# Patient Record
Sex: Male | Born: 1975
Health system: Southern US, Community
[De-identification: ages and names within clinical notes are randomized; demographics above are authoritative.]

## PROBLEM LIST (undated history)

## (undated) DIAGNOSIS — K5720 Diverticulitis of large intestine with perforation and abscess without bleeding: Secondary | ICD-10-CM

## (undated) DIAGNOSIS — F419 Anxiety disorder, unspecified: Secondary | ICD-10-CM

## (undated) DIAGNOSIS — K219 Gastro-esophageal reflux disease without esophagitis: Secondary | ICD-10-CM

## (undated) DIAGNOSIS — I10 Essential (primary) hypertension: Secondary | ICD-10-CM

## (undated) DIAGNOSIS — M1990 Unspecified osteoarthritis, unspecified site: Secondary | ICD-10-CM

## (undated) DIAGNOSIS — G5600 Carpal tunnel syndrome, unspecified upper limb: Secondary | ICD-10-CM

## (undated) HISTORY — DX: Anxiety disorder, unspecified: F41.9

## (undated) HISTORY — DX: Diverticulitis of large intestine with perforation and abscess without bleeding: K57.20

## (undated) HISTORY — DX: Gastro-esophageal reflux disease without esophagitis: K21.9

## (undated) HISTORY — DX: Essential (primary) hypertension: I10

## (undated) HISTORY — PX: VASECTOMY: SHX75

## (undated) HISTORY — DX: Carpal tunnel syndrome, unspecified upper limb: G56.00

---

## 1998-04-16 ENCOUNTER — Emergency Department (HOSPITAL_COMMUNITY): Admission: EM | Admit: 1998-04-16 | Discharge: 1998-04-17 | Payer: Self-pay | Admitting: Emergency Medicine

## 1998-04-20 ENCOUNTER — Ambulatory Visit (HOSPITAL_COMMUNITY): Admission: RE | Admit: 1998-04-20 | Discharge: 1998-04-20 | Payer: Self-pay

## 1999-03-16 ENCOUNTER — Emergency Department (HOSPITAL_COMMUNITY): Admission: EM | Admit: 1999-03-16 | Discharge: 1999-03-16 | Payer: Self-pay | Admitting: Emergency Medicine

## 1999-03-16 ENCOUNTER — Encounter: Payer: Self-pay | Admitting: Emergency Medicine

## 2005-01-29 ENCOUNTER — Emergency Department: Payer: Self-pay | Admitting: Emergency Medicine

## 2005-08-27 ENCOUNTER — Emergency Department: Payer: Self-pay | Admitting: Emergency Medicine

## 2007-06-30 ENCOUNTER — Emergency Department: Payer: Self-pay | Admitting: Emergency Medicine

## 2007-08-03 ENCOUNTER — Emergency Department: Payer: Self-pay | Admitting: Emergency Medicine

## 2009-04-27 ENCOUNTER — Emergency Department (HOSPITAL_COMMUNITY): Admission: EM | Admit: 2009-04-27 | Discharge: 2009-04-28 | Payer: Self-pay | Admitting: Emergency Medicine

## 2009-04-28 IMAGING — CT CT ABDOMEN W/ CM
2 of 4 series · 17 of 46 positions shown, 19 images · IV contrast (agent unspecified)
Comparison: None

CT ABDOMEN

CLINICAL DATA: Nausea, vomiting, right abdominal swelling and pain

CT ABDOMEN AND PELVIS WITH CONTRAST
TECHNIQUE: Multidetector CT imaging of the abdomen and pelvis was
performed using the standard protocol following bolus
administration of intravenous contrast. Breast shield utilized.
Sagittal and coronal MPR images reconstructed from axial data set.
Contrast: 100 ml [FQ] IV, dilute oral contrast

[Series 2: rtn ap with st · axial · 0.74mm/px · z∈[-298,+126]mm · 14 of 93 slices shown, 16 images]
[im 4/93  soft-tissue]
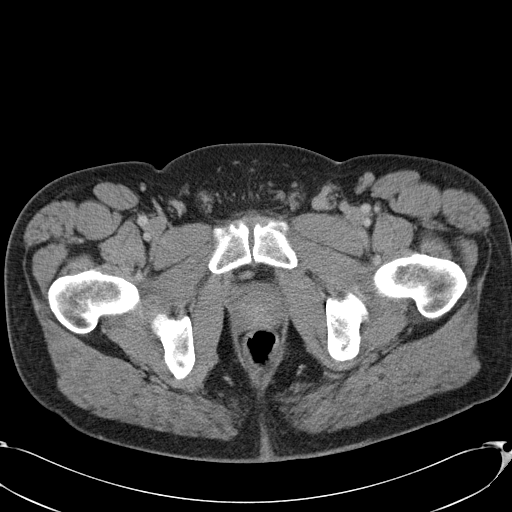
[im 4/93  bone]
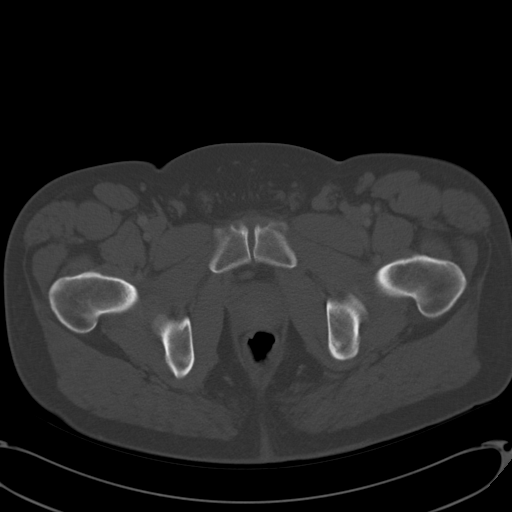
[im 12/93  soft-tissue]
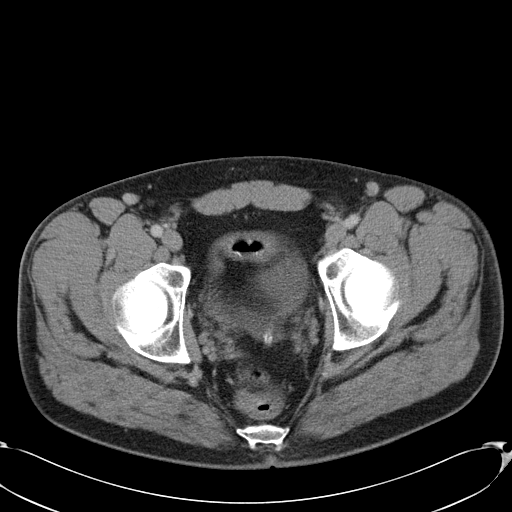
[im 19/93  soft-tissue]
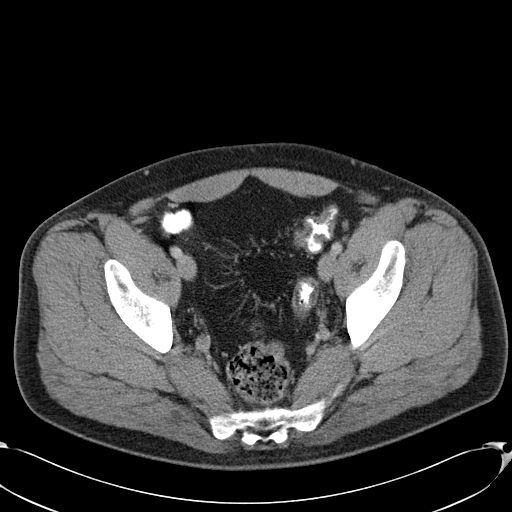
[im 26/93  soft-tissue]
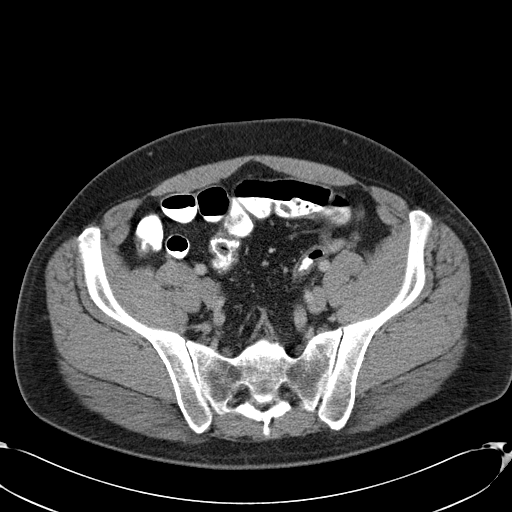
[im 30/93  soft-tissue]
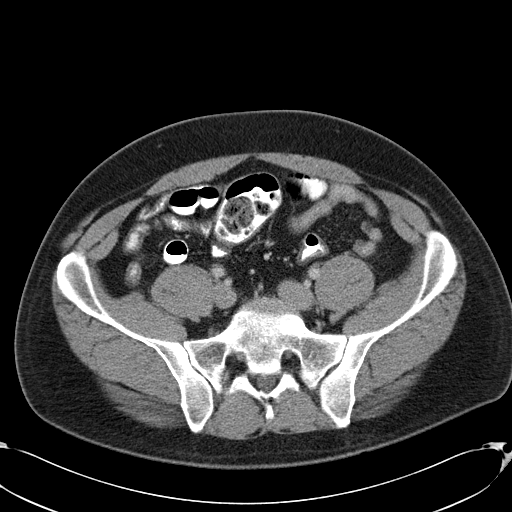
[im 37/93  soft-tissue]
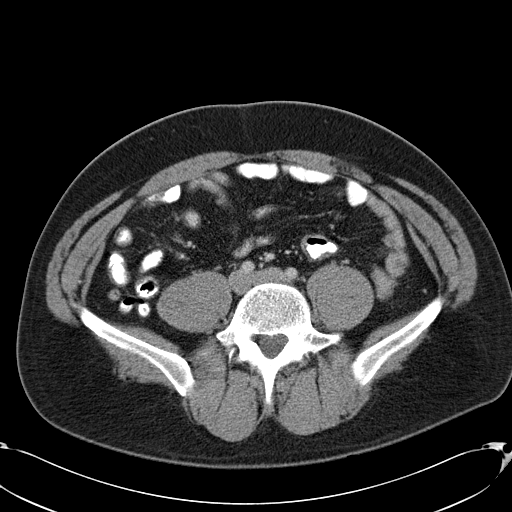
[im 45/93  soft-tissue]
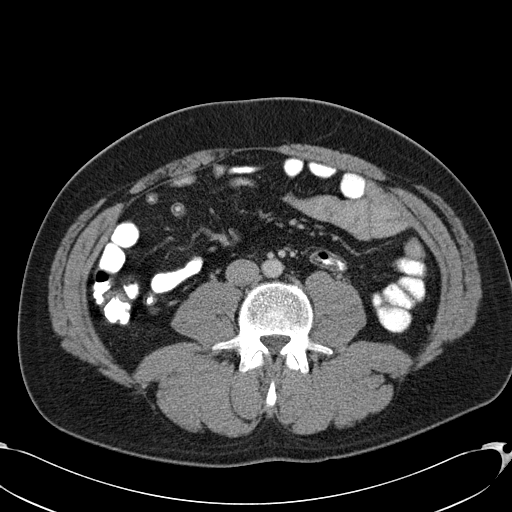
[im 48/93  soft-tissue]
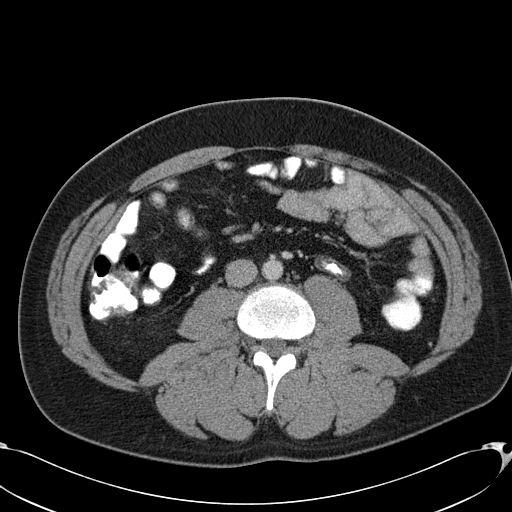
[im 56/93  soft-tissue]
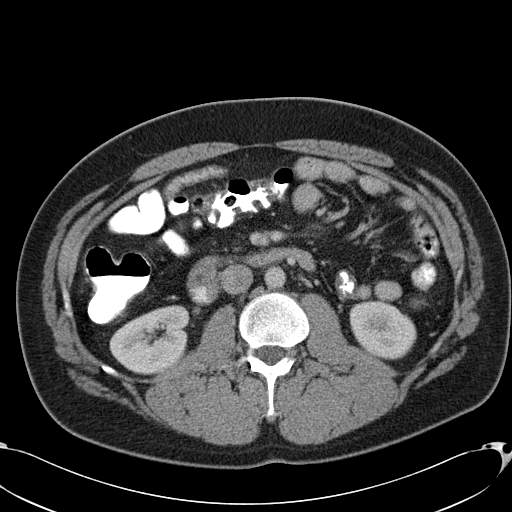
[im 56/93  bone]
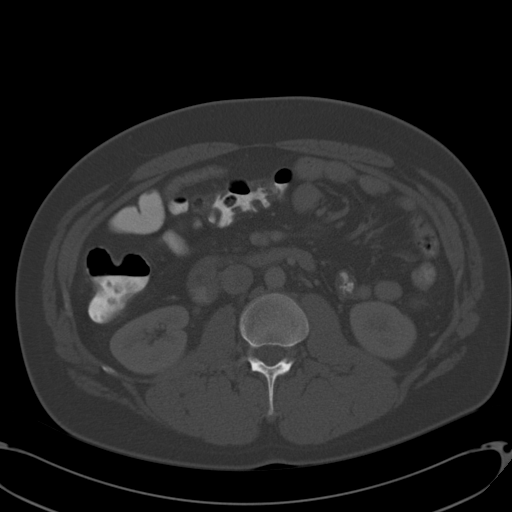
[im 63/93  soft-tissue]
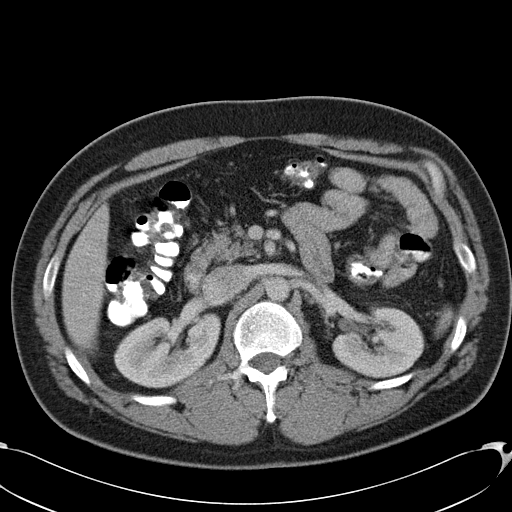
[im 70/93  soft-tissue]
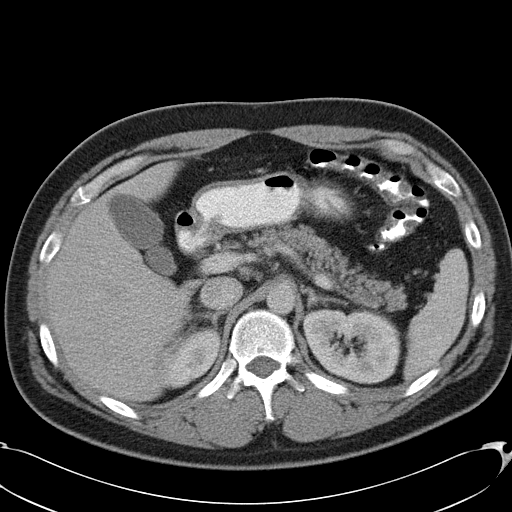
[im 74/93  soft-tissue]
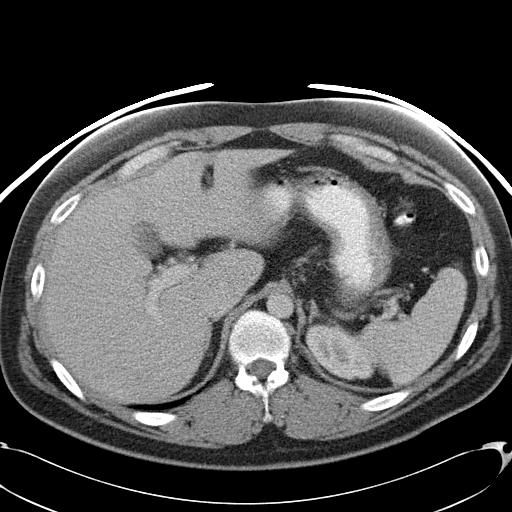
[im 81/93  soft-tissue]
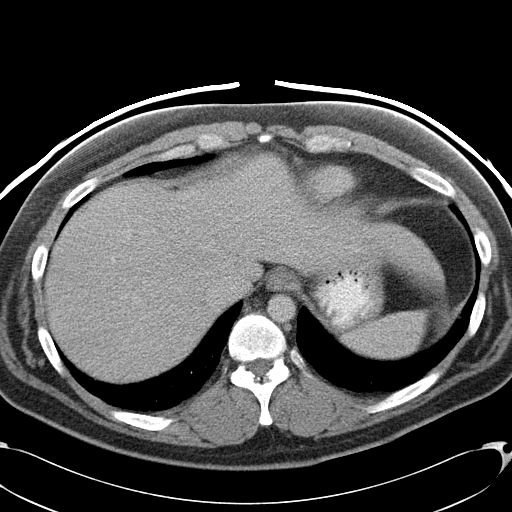
[im 89/93  soft-tissue]
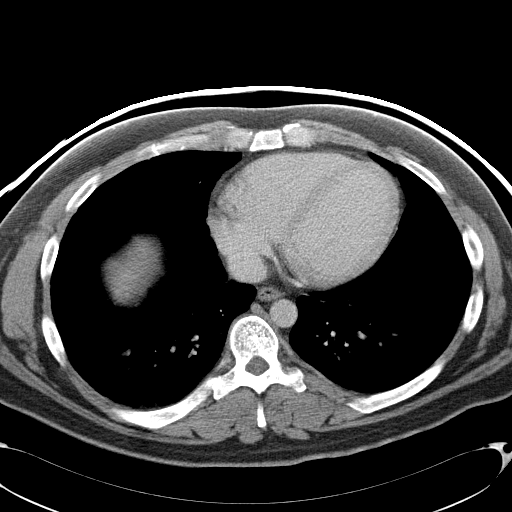

[Series 602: <mpr thick range> · coronal · 0.94mm/px · 3 of 81 slices shown]
[im 27/81  soft-tissue]
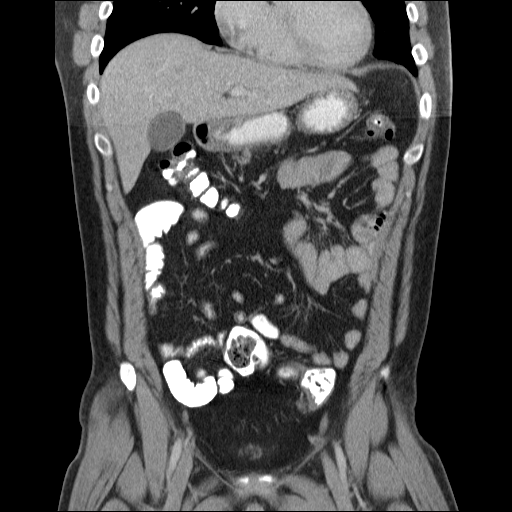
[im 36/81  soft-tissue]
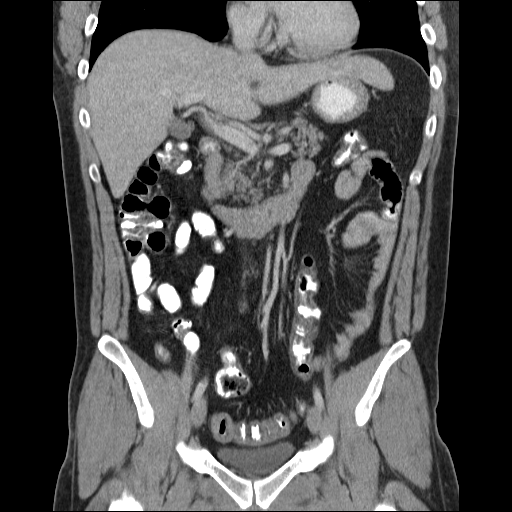
[im 45/81  soft-tissue]
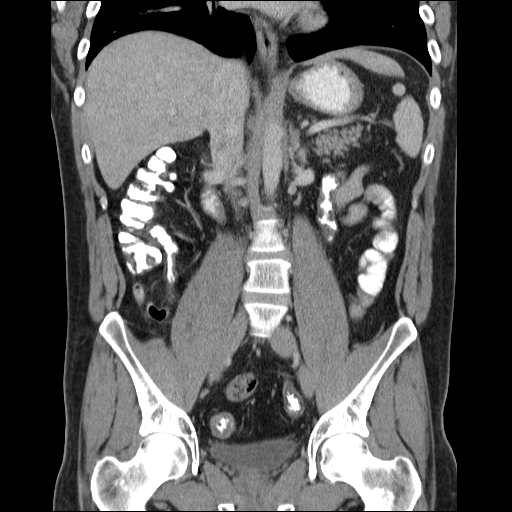

[17 of 46 positions shown; findings below may reference images not displayed]

FINDINGS: Minimal dependent atelectasis at lung bases.
Liver, spleen, pancreas, kidneys, and adrenal glands normal
appearance.
Probable small splenules adjacent to spleen.
Stomach upper abdominal bowel loops unremarkable.
No mass, adenopathy, or free fluid.
Normal-sized lymph node medial to the cecum image 48.
IMPRESSION: No acute upper abdominal abnormalities.

CT PELVIS
FINDINGS: Normal appendix.
Small bowel loops in pelvis normal appearance.
Segmental wall thickening of sigmoid colon, though incompletely
distended, without definite surrounding inflammatory changes.
This could represent muscular hypertrophy, mild colitis, or sequela
of prior diverticulitis.
No discrete acute inflammatory process, abscess, or infiltrative
changes seen.
Bladder and distal ureters normal.
Normal upper normal-sized inguinal lymph nodes.
No hernia.
Bones unremarkable.
IMPRESSION: Wall thickening of sigmoid colon without surrounding inflammatory
changes, question muscular hypertrophy or sequela of
diverticulitis, sigmoid colitis or inflammatory bowel disease, less
likely underdistension artifact, recommend correlation with patient
history and potentially follow-up sigmoidoscopy.
No other intrapelvic abnormalities.

## 2010-05-18 ENCOUNTER — Emergency Department (HOSPITAL_COMMUNITY)
Admission: EM | Admit: 2010-05-18 | Discharge: 2010-05-18 | Payer: Self-pay | Source: Home / Self Care | Admitting: Emergency Medicine

## 2010-05-18 IMAGING — CR DG ANKLE COMPLETE 3+V*L*
3 series · 3 of 3 positions shown · non-contrast
Comparison: None.

CLINICAL DATA: Left ankle injury.  Swelling.

LEFT ANKLE COMPLETE - 3+ VIEW

[t ankle joint ap left]
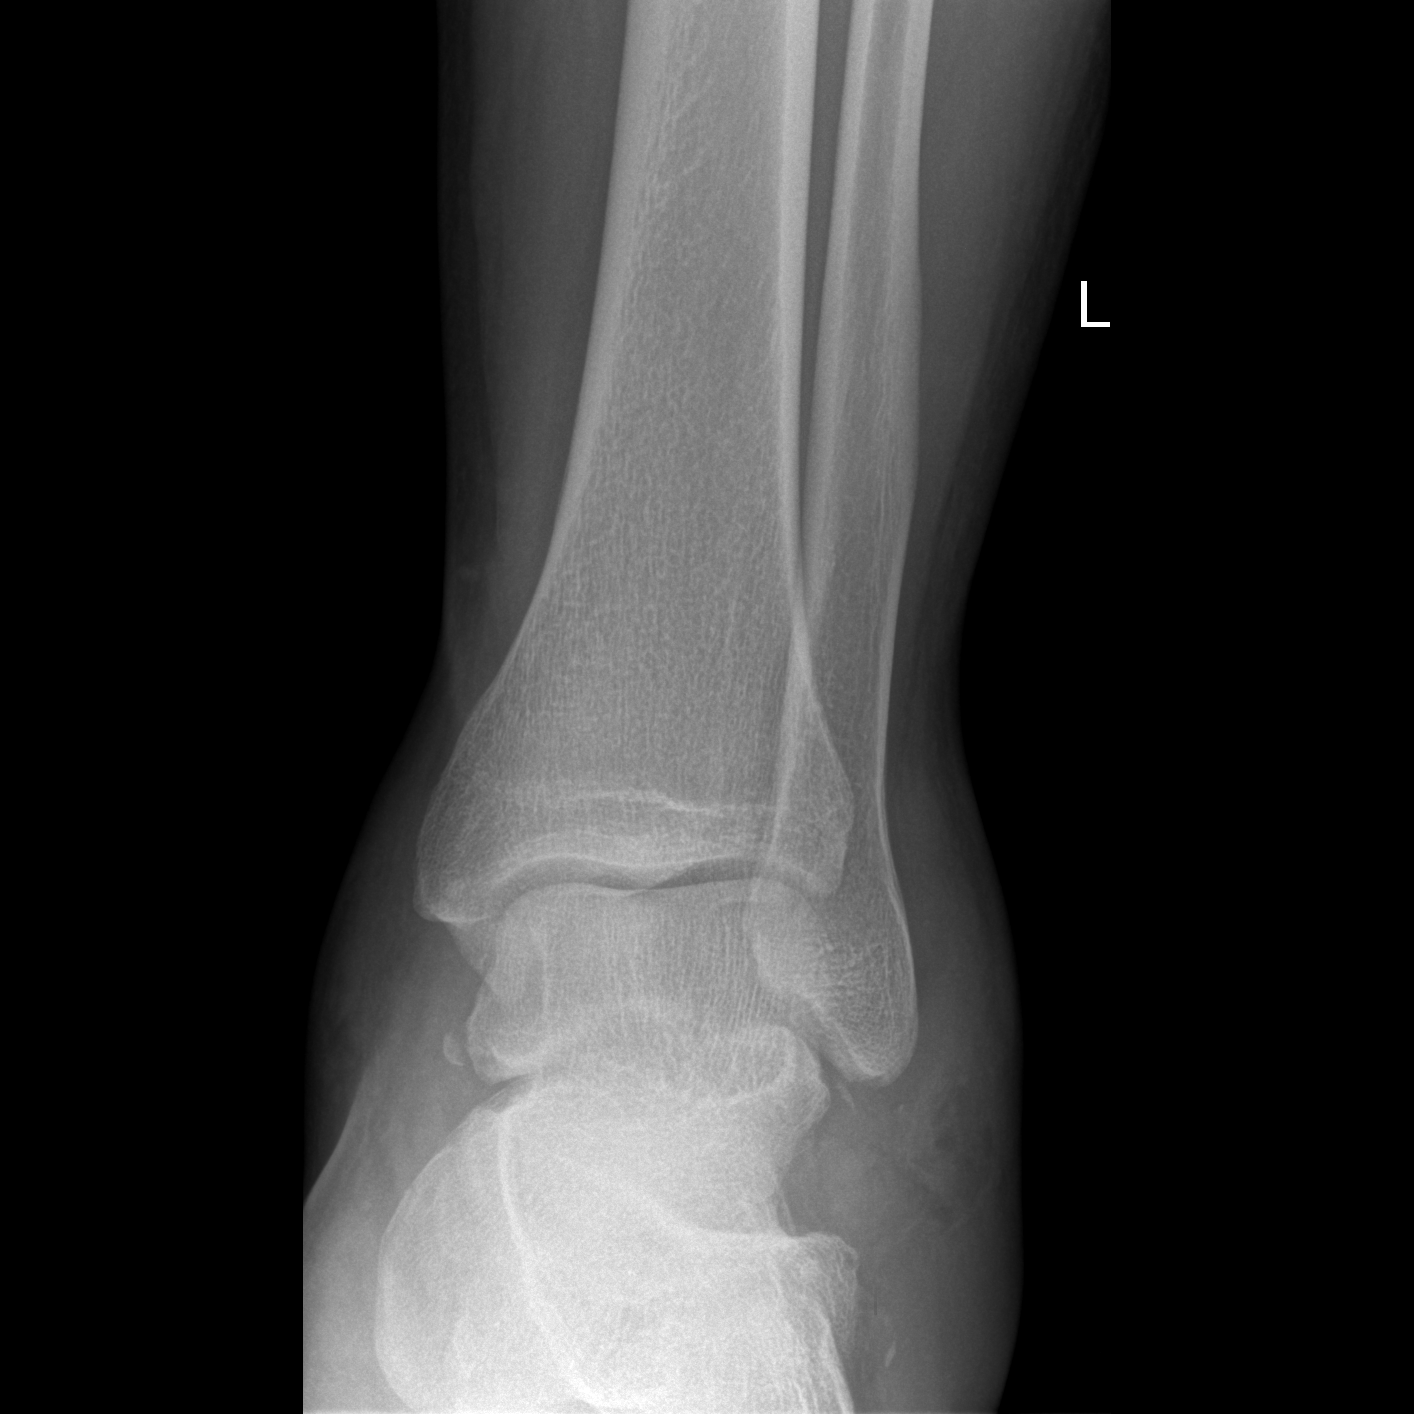

[t ankle joint oblique left]
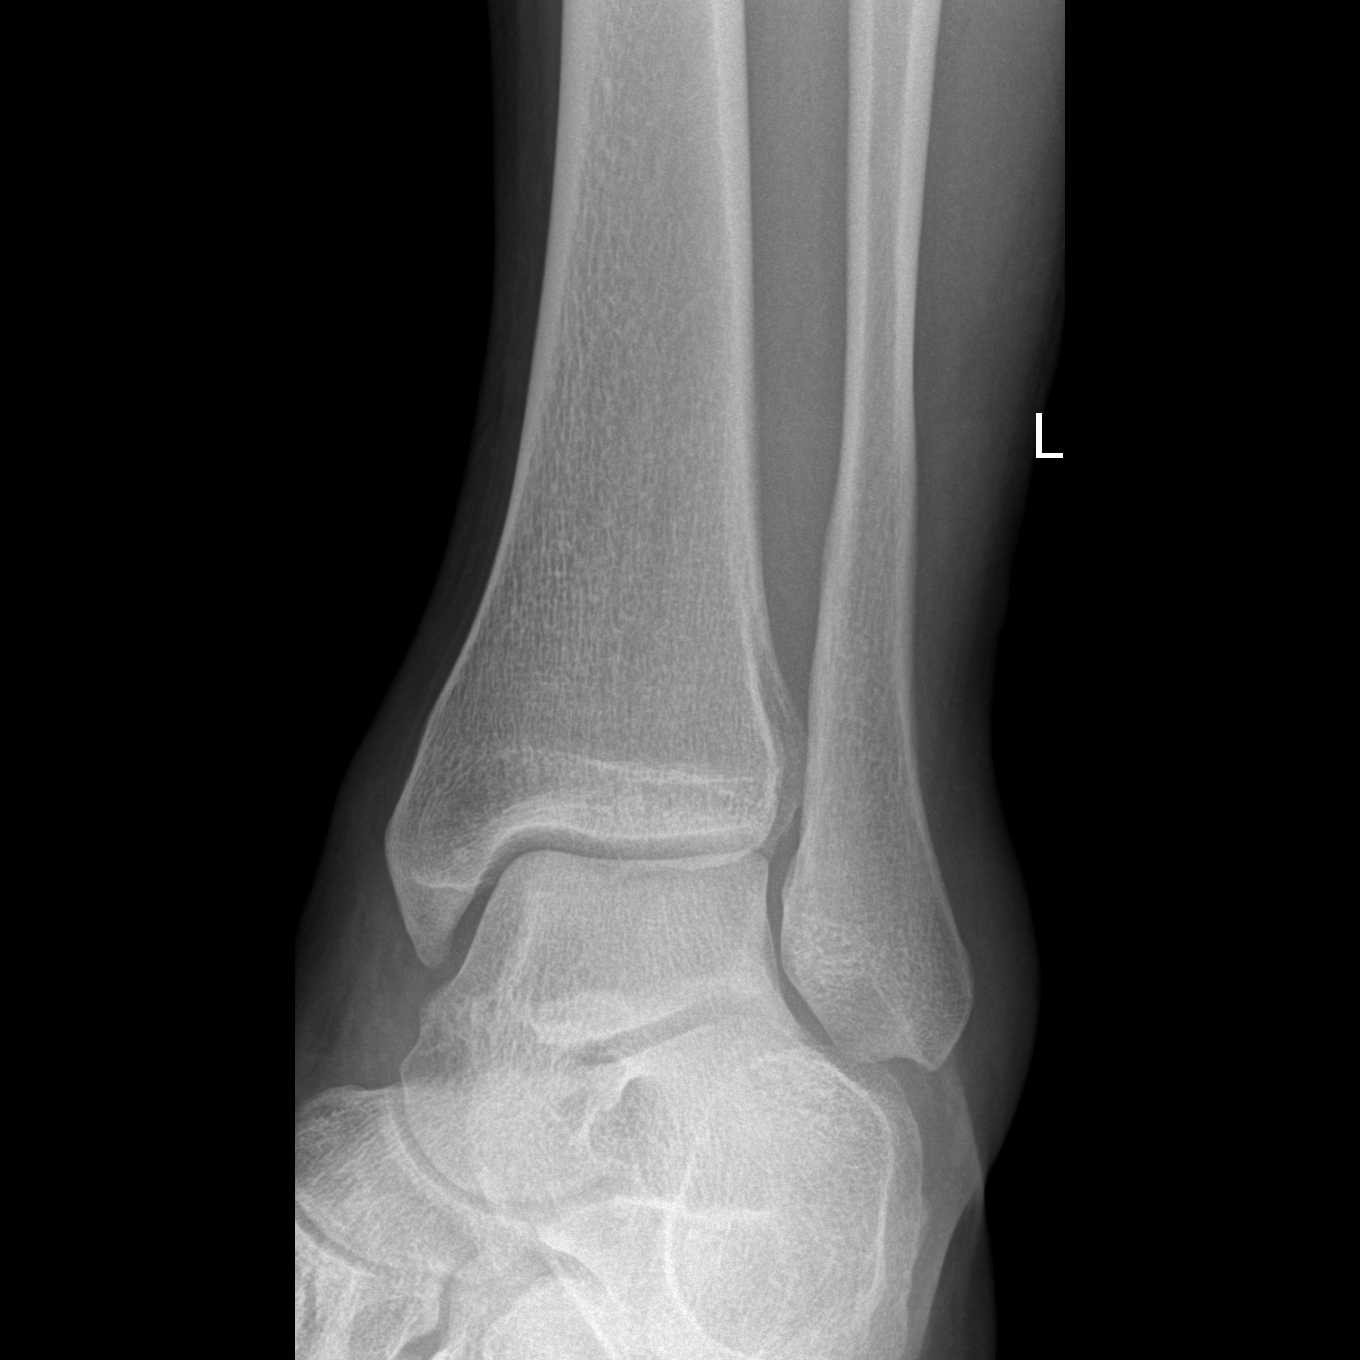

[t ankle joint lat left]
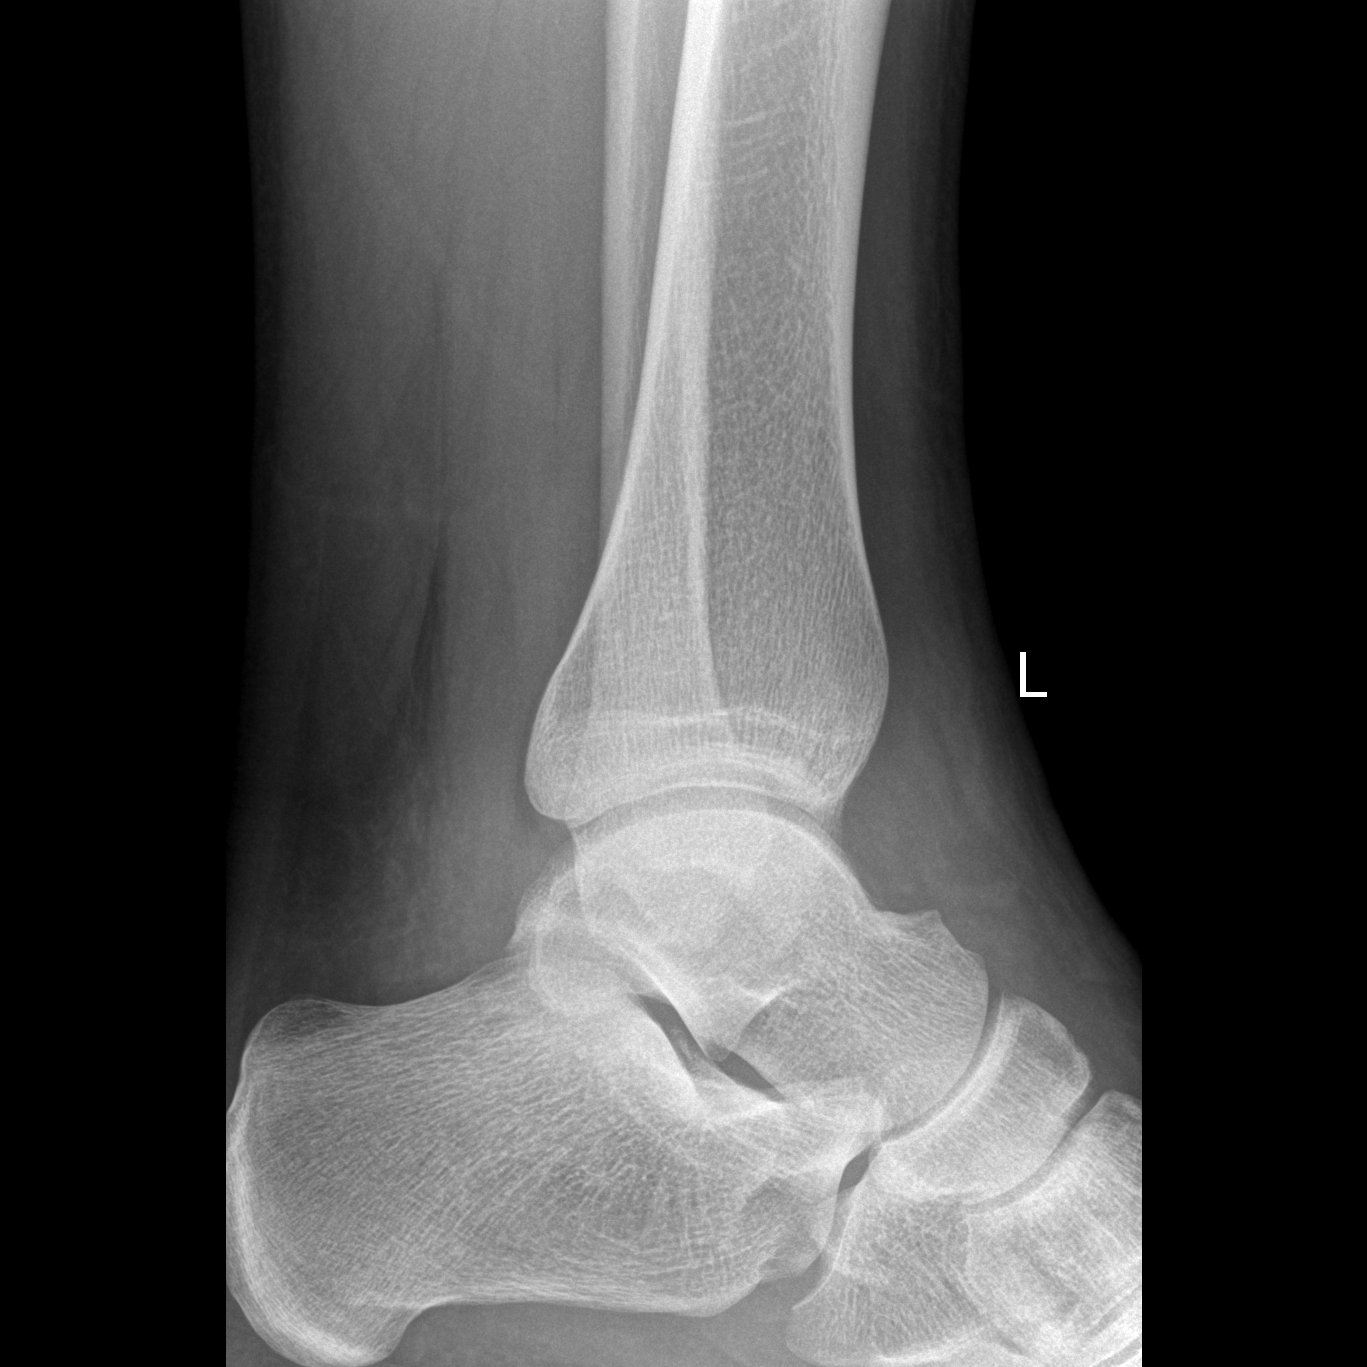

[3 of 3 positions shown; findings below may reference images not displayed]

FINDINGS: Diffuse soft tissue swelling is present around the ankle.
There is a small bone fragment adjacent to the tip of the lateral
malleolus consistent with avulsion fracture from the
calcaneofibular ligament.  Small avulsion is present in the medial
ankle as well, likely from the deltoid ligament.  The ankle mortise
remains congruent.  Ankle effusion is present. Tiny fleck of
calcification is present in the posterior subtalar joint, likely
representing calcification in the ligaments.
IMPRESSION: Small medial and lateral avulsion fractures associated with ankle
sprain and ligament injury.

## 2011-01-08 LAB — COMPREHENSIVE METABOLIC PANEL
ALT: 18 U/L (ref 0–53)
AST: 20 U/L (ref 0–37)
Albumin: 4.3 g/dL (ref 3.5–5.2)
Alkaline Phosphatase: 44 U/L (ref 39–117)
BUN: 11 mg/dL (ref 6–23)
CO2: 25 mEq/L (ref 19–32)
Calcium: 9.2 mg/dL (ref 8.4–10.5)
Chloride: 108 mEq/L (ref 96–112)
Creatinine, Ser: 0.85 mg/dL (ref 0.4–1.5)
GFR calc Af Amer: >60 mL/min (ref >60)
GFR calc non Af Amer: >60 mL/min (ref >60)
Glucose, Bld: 85 mg/dL (ref 70–99)
Potassium: 3.7 mEq/L (ref 3.5–5.1)
Sodium: 139 mEq/L (ref 135–145)
Total Bilirubin: 0.9 mg/dL (ref 0.3–1.2)
Total Protein: 6.7 g/dL (ref 6.0–8.3)

## 2011-01-08 LAB — DIFFERENTIAL
Basophils Relative: 1 % (ref 0–1)
Eosinophils Absolute: 0.4 10*3/uL (ref 0.0–0.7)
Lymphs Abs: 2.3 10*3/uL (ref 0.7–4.0)
Monocytes Absolute: 0.7 10*3/uL (ref 0.1–1.0)
Monocytes Relative: 7 % (ref 3–12)

## 2011-01-08 LAB — URINALYSIS, ROUTINE W REFLEX MICROSCOPIC
Glucose, UA: NEGATIVE mg/dL
Hgb urine dipstick: NEGATIVE
Protein, ur: NEGATIVE mg/dL
pH: 7 (ref 5.0–8.0)

## 2011-01-08 LAB — CBC
HCT: 48.8 % (ref 39.0–52.0)
Hemoglobin: 16.4 g/dL (ref 13.0–17.0)
MCHC: 33.6 g/dL (ref 30.0–36.0)
MCV: 89.5 fL (ref 78.0–100.0)
Platelets: 208 10*3/uL (ref 150–400)
RBC: 5.45 MIL/uL (ref 4.22–5.81)
RDW: 13.5 % (ref 11.5–15.5)
WBC: 10.1 10*3/uL (ref 4.0–10.5)

## 2011-03-16 ENCOUNTER — Other Ambulatory Visit: Payer: Self-pay | Admitting: Internal Medicine

## 2011-03-16 ENCOUNTER — Ambulatory Visit
Admission: RE | Admit: 2011-03-16 | Discharge: 2011-03-16 | Disposition: A | Discharge status: Home / Self Care | Payer: 59 | Source: Ambulatory Visit | Attending: Internal Medicine | Admitting: Internal Medicine

## 2011-03-16 DIAGNOSIS — M542 Cervicalgia: Secondary | ICD-10-CM

## 2011-03-16 DIAGNOSIS — S022XXA Fracture of nasal bones, initial encounter for closed fracture: Secondary | ICD-10-CM
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
## 2012-02-08 ENCOUNTER — Ambulatory Visit (INDEPENDENT_AMBULATORY_CARE_PROVIDER_SITE_OTHER): Payer: 59 | Admitting: Internal Medicine

## 2012-02-08 VITALS — BP 126/81 | HR 57 | Temp 98.2°F | Resp 18 | Ht 71.0 in | Wt 224.0 lb

## 2012-02-08 DIAGNOSIS — M79669 Pain in unspecified lower leg: Secondary | ICD-10-CM

## 2012-02-08 DIAGNOSIS — L03119 Cellulitis of unspecified part of limb: Secondary | ICD-10-CM

## 2012-02-08 DIAGNOSIS — L02419 Cutaneous abscess of limb, unspecified: Secondary | ICD-10-CM

## 2012-02-08 LAB — POCT CBC
Granulocyte percent: 64.8 %G (ref 37–80)
MCH, POC: 28.8 pg (ref 27–31.2)
MCV: 88.5 fL (ref 80–97)
MID (cbc): 1 — AB (ref 0–0.9)
MPV: 8.7 fL (ref 0–99.8)
POC LYMPH PERCENT: 25.6 %L (ref 10–50)
POC MID %: 9.6 %M (ref 0–12)
Platelet Count, POC: 262 10*3/uL (ref 142–424)
RBC: 4.86 M/uL (ref 4.69–6.13)
RDW, POC: 14.3 %
WBC: 9.9 10*3/uL (ref 4.6–10.2)

## 2012-02-08 MED ORDER — AMOXICILLIN-POT CLAVULANATE 875-125 MG PO TABS: 1.0000 | ORAL_TABLET | Freq: Two times a day (BID) | ORAL | Status: AC

## 2012-02-08 NOTE — Progress Notes (Signed)
  Subjective:    Patient ID: ALEXZANDER DOLINGER, male    DOB: [birth year], 36 y.o.   MRN: [id number]  HPI  Mr. Joos comes in today with his wife and reports that he noticed pain and a red spot on his right lower leg for 24 hours. Foot hurts on dorsal aspect. Initially he noticed pain only and the red area appeared later.  No fever but had chills.  Denies injury to area.  No calf pain or swelling. States he started a new job and now is on his feet all the time.  States he has no known health issues. Smoker  Review of Systems As above    Objective:   Physical Exam  Constitutional: He appears well-developed and well-nourished.  Skin:          Pain distal to erythematous area into dorsal aspect of the foot to great toe.  Plantar aspect of 1st MT head with healing blister.  No lesions in web spaces.  No other wounds or skin defects noted on foot. No calf pain. Pulses 2 +. No edema.      Results for orders placed in visit on 02/08/12  POCT CBC      Component Value Range   WBC 9.9  4.6 - 10.2 (K/uL)   Lymph, poc 2.5  0.6 - 3.4    POC LYMPH PERCENT 25.6  10 - 50 (%L)   MID (cbc) 1.0 (*) 0 - 0.9    POC MID % 9.6  0 - 12 (%M)   POC Granulocyte 6.4  2 - 6.9    Granulocyte percent 64.8  37 - 80 (%G)   RBC 4.86  4.69 - 6.13 (M/uL)   Hemoglobin 14.0 (*) 14.1 - 18.1 (g/dL)   HCT, POC 40.9 (*) 81.1 - 53.7 (%)   MCV 88.5  80 - 97 (fL)   MCH, POC 28.8  27 - 31.2 (pg)   MCHC 32.6  31.8 - 35.4 (g/dL)   RDW, POC 91.4     Platelet Count, POC 262  142 - 424 (K/uL)   MPV 8.7  0 - 99.8 (fL)  GLUCOSE, POCT (MANUAL RESULT ENTRY)      Component Value Range   POC Glucose 82         Assessment & Plan:  Cellulitis leg  Augmentin 875 x 10 days.  Out of work x 2 days. ACE for compression. Elevate.  Watch for fever, chills, streaking worsening symptoms. Call Saturday with status.  Sooner if worse.   Seen with Dr. Merla Riches

## 2012-02-12 ENCOUNTER — Telehealth: Payer: Self-pay

## 2012-02-12 NOTE — Telephone Encounter (Signed)
Patients wife called stating that pt is not better. Was told to call if this was the case.

## 2012-02-13 ENCOUNTER — Ambulatory Visit (INDEPENDENT_AMBULATORY_CARE_PROVIDER_SITE_OTHER): Payer: 59 | Admitting: Family Medicine

## 2012-02-13 VITALS — BP 120/79 | HR 67 | Temp 97.6°F | Resp 20 | Ht 71.5 in | Wt 224.0 lb

## 2012-02-13 DIAGNOSIS — M7660 Achilles tendinitis, unspecified leg: Secondary | ICD-10-CM

## 2012-02-13 DIAGNOSIS — S838X9A Sprain of other specified parts of unspecified knee, initial encounter: Secondary | ICD-10-CM

## 2012-02-13 DIAGNOSIS — S86119A Strain of other muscle(s) and tendon(s) of posterior muscle group at lower leg level, unspecified leg, initial encounter: Secondary | ICD-10-CM

## 2012-02-13 MED ORDER — HYDROCODONE-ACETAMINOPHEN 5-325 MG PO TABS: 1.0000 | ORAL_TABLET | Freq: Four times a day (QID) | ORAL | Status: AC | PRN

## 2012-02-13 MED ORDER — MELOXICAM 15 MG PO TABS: 15.0000 mg | ORAL_TABLET | Freq: Every day | ORAL | Status: AC

## 2012-02-13 NOTE — Progress Notes (Signed)
  Subjective:    Patient ID: Eric Peck, male    DOB: 03/28/1976, 36 y.o.   MRN: [id number]  HPI  Presents with worsening pain of the left leg.  Was seen here 02/08/2012 with a tender lump on the anterior left lower leg, with mild erythema.  He was diagnosed with cellulitis and placed on Augmentin.  He is tolerating the antibiotic well, along with Ibuprofen, but without benefit.  The lower leg is now swollen and tender from the ankle to the knee.  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 He broke the left ankle previously. He notes that he's been toe walking on the left since last week when his initial symptoms began.  Smoker.  Hopes to quit again.  Did well with Chantix until he developed bad dreams.  Father died of AMI at age 52, due to complications of renal transplant.  He had renal failure from diabetes. None of his 5 siblings have diabetes.    Review of Systems No chest pain, SOB, HA, dizziness, vision change, N/V, diarrhea, dysuria.    Objective:   Physical Exam Vital signs noted. Well-developed, well nourished WM who is awake, alert and oriented, in NAD. HEENT: [place]/AT, sclera and conjunctiva are clear.   Extremities: patch of mildly raised, mildly erythematous skin on the lower anterior tibia. Medially is a macular patch of darker erythema, consistent with capillary hemorrhage. No ecchymosis. Mild-moderate swelling of the left leg from ankle to knee. Tenderness of the gastroc and along the achilles tendon. No increased warmth.  No increased erythema other than as described. Strong pedal pulses.  Cap refill <3 seconds. Good sensation. Skin: warm and dry.     Assessment & Plan:   1. Achilles tendonitis  meloxicam (MOBIC) 15 MG tablet  2. Gastrocnemius muscle strain  meloxicam (MOBIC) 15 MG tablet   Norco 5/325 Q6 hours prn pain not relieved by  Meloxicam.  Re-evaluate Friday, 02/16/2012, sooner if symptoms worsen.  Patient Instructions  Use the crutches you have to keep your weight OFF the leg/foot.  Discontinue the ibuprofen and take the meloxicam instead.  Seen with Dr. Hal Hope.

## 2012-02-13 NOTE — Telephone Encounter (Signed)
Patient is currently in room 14 being seen by Porfirio Oar, PA-C as I type this. Will close the phone note as this will be addressed during the OV today.

## 2012-02-13 NOTE — Telephone Encounter (Signed)
Spoke with pt and stated that he is experiencing more swelling in his ankle (that it's not only just swollen in front, but all around the ankle now), also states that he has developed red spots in the area with it being warm.  Has had no fever,chills. Please advise what pt will need to do.

## 2012-02-13 NOTE — Patient Instructions (Signed)
Use the crutches you have to keep your weight OFF the leg/foot.  Discontinue the ibuprofen and take the meloxicam instead.

## 2012-03-20 ENCOUNTER — Ambulatory Visit (INDEPENDENT_AMBULATORY_CARE_PROVIDER_SITE_OTHER): Payer: 59 | Admitting: Family Medicine

## 2012-03-20 VITALS — BP 129/70 | HR 69 | Temp 98.1°F | Resp 16 | Ht 71.5 in | Wt 220.6 lb

## 2012-03-20 DIAGNOSIS — L237 Allergic contact dermatitis due to plants, except food: Secondary | ICD-10-CM

## 2012-03-20 DIAGNOSIS — L254 Unspecified contact dermatitis due to food in contact with skin: Secondary | ICD-10-CM

## 2012-03-20 DIAGNOSIS — S8010XA Contusion of unspecified lower leg, initial encounter: Secondary | ICD-10-CM

## 2012-03-20 MED ORDER — METHYLPREDNISOLONE ACETATE 80 MG/ML IJ SUSP
80.0000 mg | Freq: Once | INTRAMUSCULAR | Status: AC
  Administered 2012-03-20: 80 mg via INTRAMUSCULAR

## 2012-03-20 MED ORDER — PREDNISONE 20 MG PO TABS: ORAL_TABLET | ORAL | Status: AC

## 2012-03-20 NOTE — Patient Instructions (Signed)
Poison Ivy Poison ivy is a inflammation of the skin (contact dermatitis) caused by touching the allergens on the leaves of the ivy plant following previous exposure to the plant. The rash usually appears 48 hours after exposure. The rash is usually bumps (papules) or blisters (vesicles) in a linear pattern. Depending on your own sensitivity, the rash may simply cause redness and itching, or it may also progress to blisters which may break open. These must be well cared for to prevent secondary bacterial (germ) infection, followed by scarring. Keep any open areas dry, clean, dressed, and covered with an antibacterial ointment if needed. The eyes may also get puffy. The puffiness is worst in the morning and gets better as the day progresses. This dermatitis usually heals without scarring, within 2 to 3 weeks without treatment. HOME CARE INSTRUCTIONS  Thoroughly wash with soap and water as soon as you have been exposed to poison ivy. You have about one half hour to remove the plant resin before it will cause the rash. This washing will destroy the oil or antigen on the skin that is causing, or will cause, the rash. Be sure to wash under your fingernails as any plant resin there will continue to spread the rash. Do not rub skin vigorously when washing affected area. Poison ivy cannot spread if no oil from the plant remains on your body. A rash that has progressed to weeping sores will not spread the rash unless you have not washed thoroughly. It is also important to wash any clothes you have been wearing as these may carry active allergens. The rash will return if you wear the unwashed clothing, even several days later. Avoidance of the plant in the future is the best measure. Poison ivy plant can be recognized by the number of leaves. Generally, poison ivy has three leaves with flowering branches on a single stem. Diphenhydramine may be purchased over the counter and used as needed for itching. Do not drive with  this medication if it makes you drowsy.Ask your caregiver about medication for children. SEEK MEDICAL CARE IF:  Open sores develop.   Redness spreads beyond area of rash.   You notice purulent (pus-like) discharge.   You have increased pain.   Other signs of infection develop (such as fever).  Document Released: 09/15/2000 Document Revised: 09/07/2011 Document Reviewed: 08/04/2009 [employer] Patient Information 2012 [employer]. 

## 2012-03-20 NOTE — Progress Notes (Signed)
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 He recently had an attack of poison ivy. He got it again yesterday, this time on his face. He tried to use protective gear but obviously didn't use it well enough. He also has a bump on his right leg he is concerned about. He is not want to get infected like it but the last time.  Objective: Face is puffy with more direct. As a little bit of his arms right arm also, which appears to be a little older. On his right shin is a tiny soft tissue nodule area about 3 or 4 cm in diameter  Assessment:  Dermatitis from poison ivy Probable hematoma right leg  Plan:  Talked to him poison ivy. Depo-Medrol 80 IM. Tapered dose prednisone also. Use OTC had cortisone cream on the pressure is placed for maximum of 3 days. Return if worse. Reassured about the leg.

## 2012-03-21 ENCOUNTER — Telehealth: Payer: Self-pay

## 2012-03-21 NOTE — Telephone Encounter (Signed)
LMOM ADVISING PT TO RTC

## 2012-03-21 NOTE — Telephone Encounter (Signed)
Patient should RTC. 

## 2012-03-21 NOTE — Telephone Encounter (Signed)
Pt was seen yesterday for swelling in his face and received a cortisone shot , today pt's face is more swollen than yesterday and pt would like to know what to do. Pharmacy: [facility] on [address] In [place]

## 2012-03-21 NOTE — Telephone Encounter (Signed)
Should patient RTC?

## 2012-08-06 ENCOUNTER — Emergency Department: Payer: Self-pay | Admitting: Emergency Medicine

## 2012-08-06 LAB — CBC WITH DIFFERENTIAL/PLATELET
Basophil %: 1.2 %
Eosinophil %: 3.8 %
HCT: 46.6 % (ref 40.0–52.0)
HGB: 16.1 g/dL (ref 13.0–18.0)
Lymphocyte #: 1.8 10*3/uL (ref 1.0–3.6)
MCH: 30.5 pg (ref 26.0–34.0)
MCHC: 34.5 g/dL (ref 32.0–36.0)
MCV: 88 fL (ref 80–100)
Monocyte #: 0.8 x10 3/mm (ref 0.2–1.0)
RBC: 5.27 10*6/uL (ref 4.40–5.90)

## 2012-08-06 LAB — BASIC METABOLIC PANEL
Chloride: 106 mmol/L (ref 98–107)
Co2: 26 mmol/L (ref 21–32)
Creatinine: 0.76 mg/dL (ref 0.60–1.30)
EGFR (Non-African Amer.): >60
Osmolality: 275 (ref 275–301)
Potassium: 4.4 mmol/L (ref 3.5–5.1)
Sodium: 138 mmol/L (ref 136–145)

## 2012-08-06 IMAGING — CT CT HEAD WITHOUT CONTRAST
1 series · 15 of 30 positions shown, 19 images · non-contrast
Comparison: none

REASON FOR EXAM: headache
COMMENTS:

[Series 2: soft tissue · axial · 0.45mm/px · z∈[+381,+516]mm · 15 of 31 slices shown, 19 images]
[im 2/31  brain]
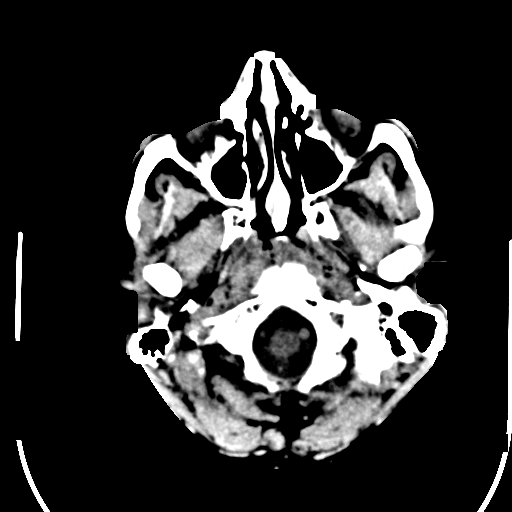
[im 2/31  bone]
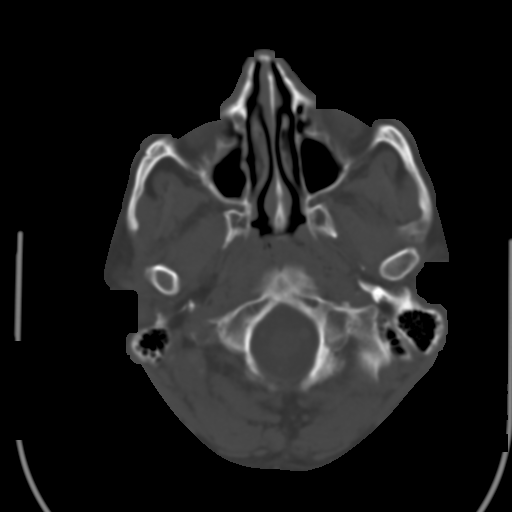
[im 4/31  brain]
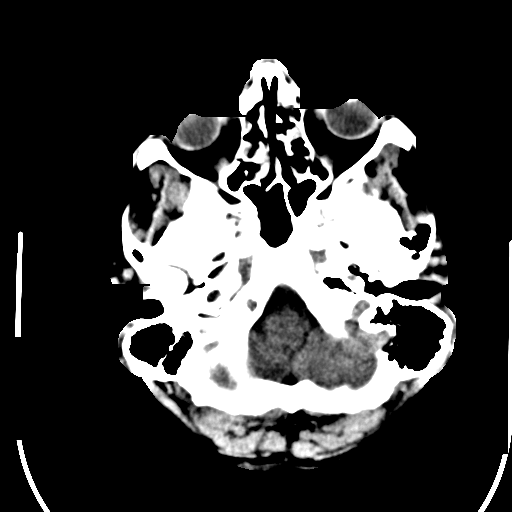
[im 6/31  brain]
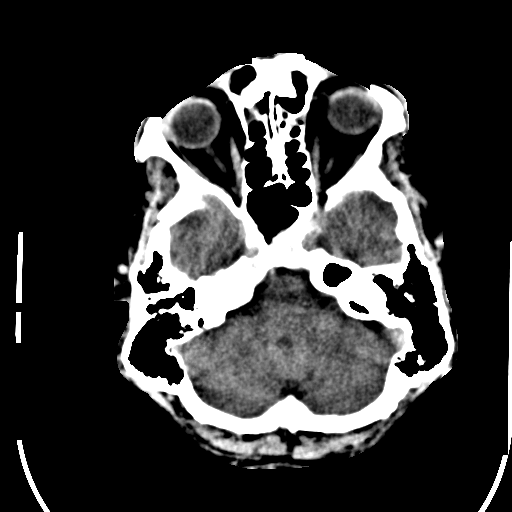
[im 8/31  brain]
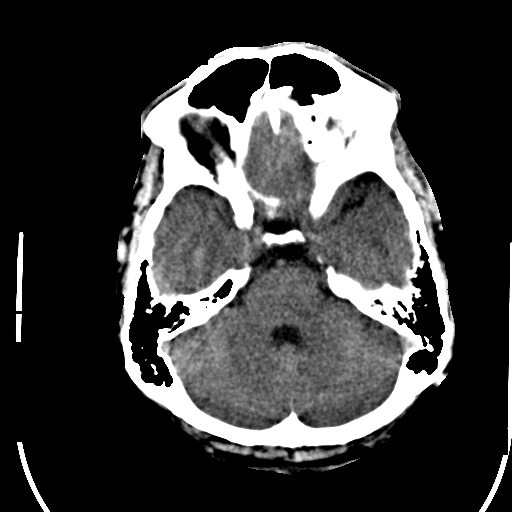
[im 10/31  brain]
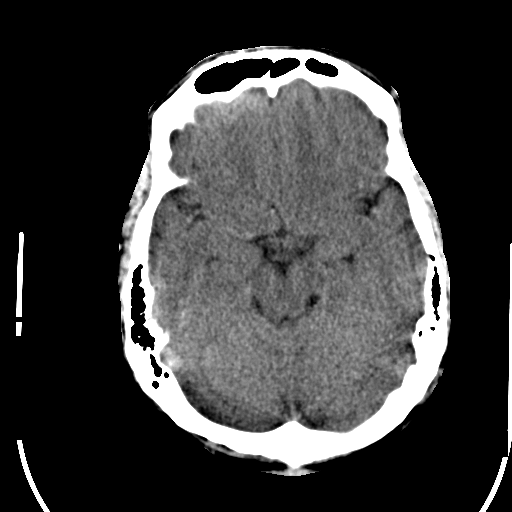
[im 10/31  bone]
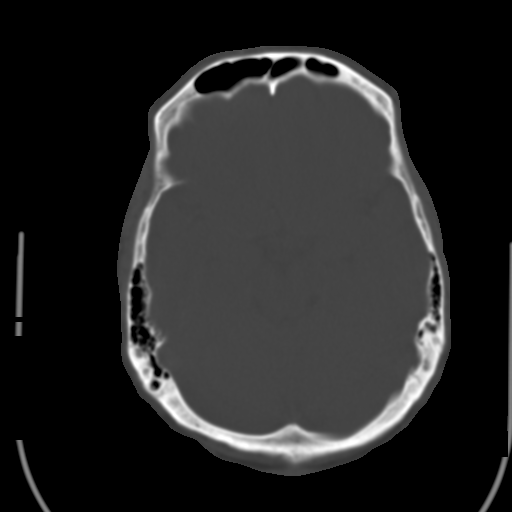
[im 12/31  brain]
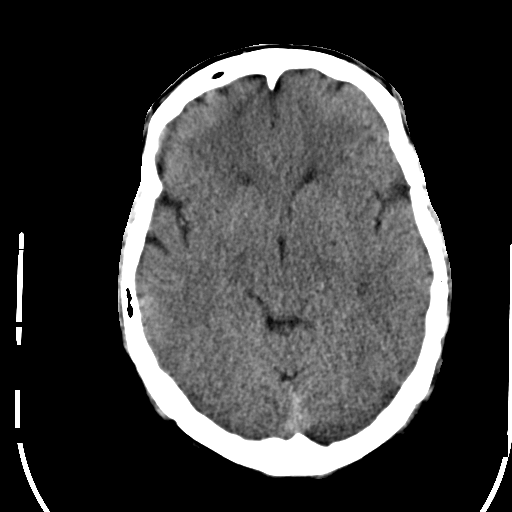
[im 14/31  brain]
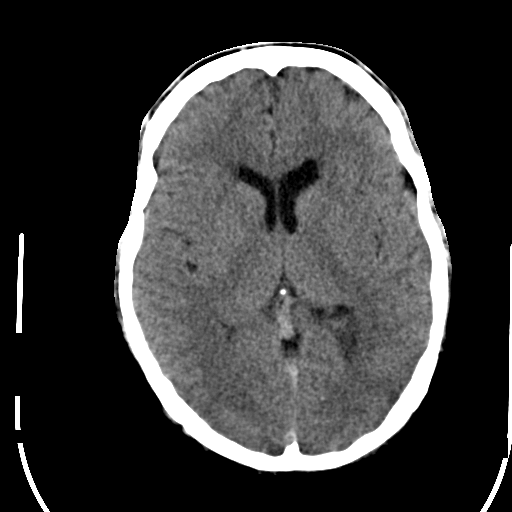
[im 16/31  brain]
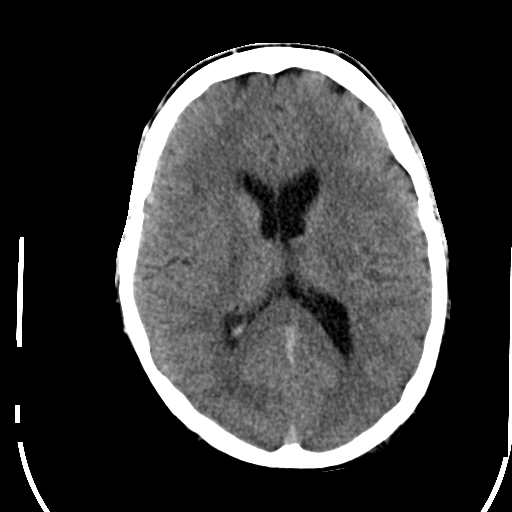
[im 17/31  brain]
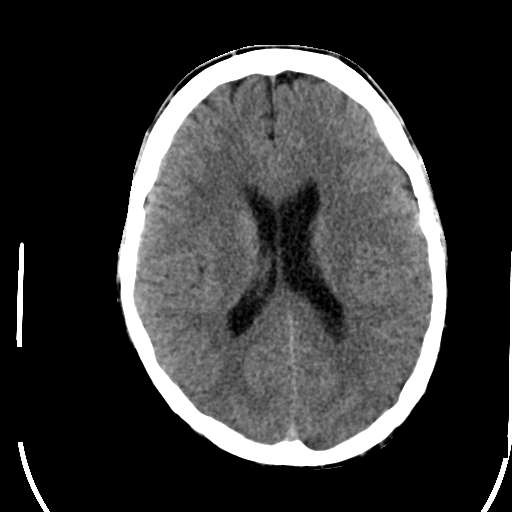
[im 17/31  bone]
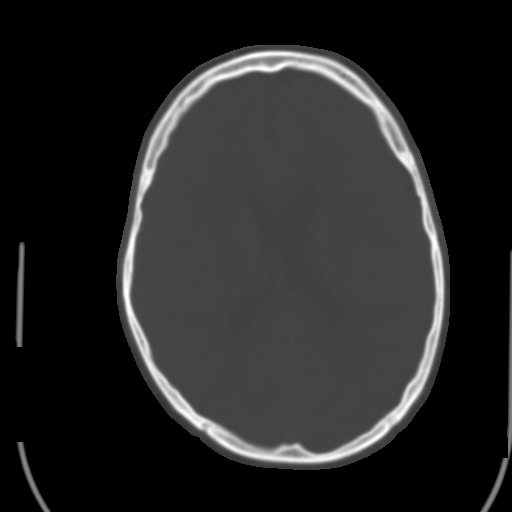
[im 19/31  brain]
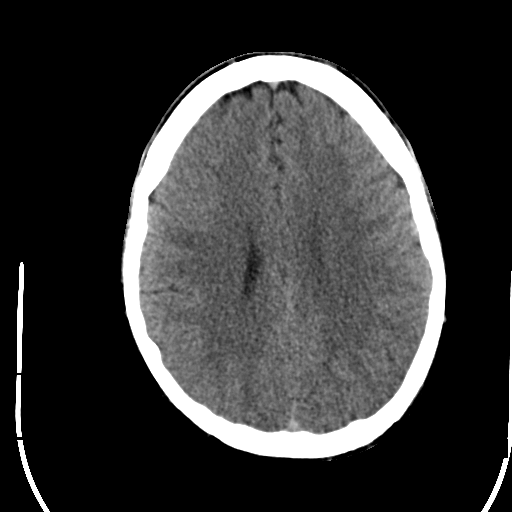
[im 21/31  brain]
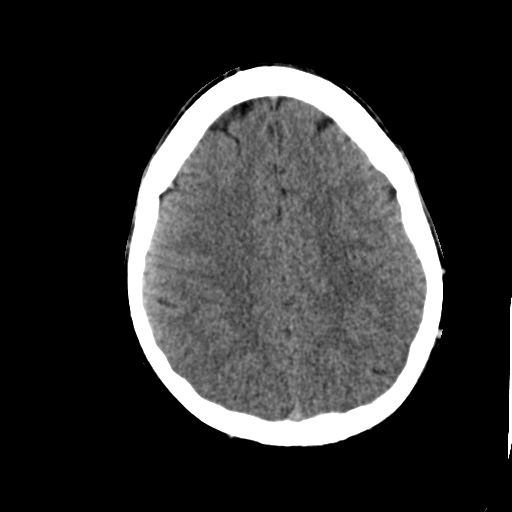
[im 23/31  brain]
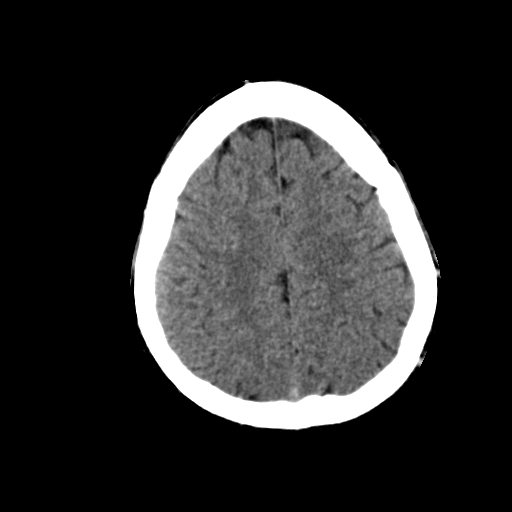
[im 25/31  brain]
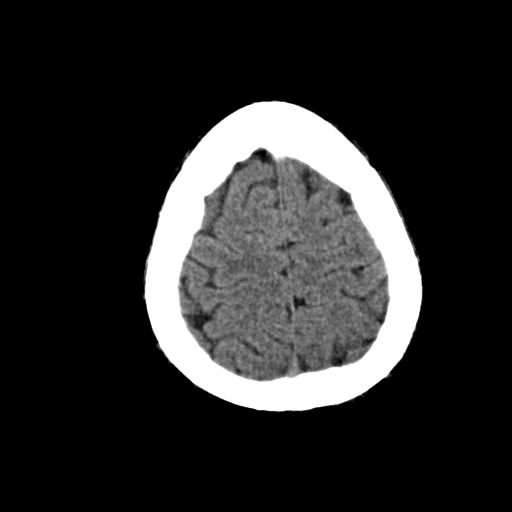
[im 25/31  bone]
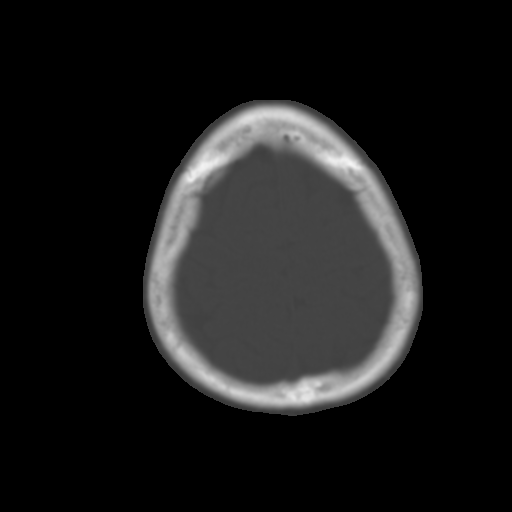
[im 27/31  brain]
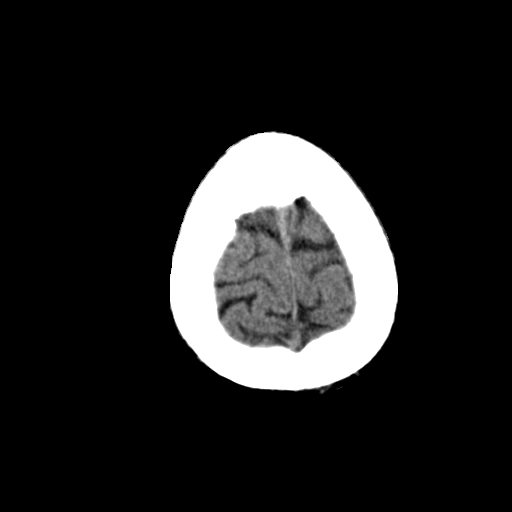
[im 29/31  brain]
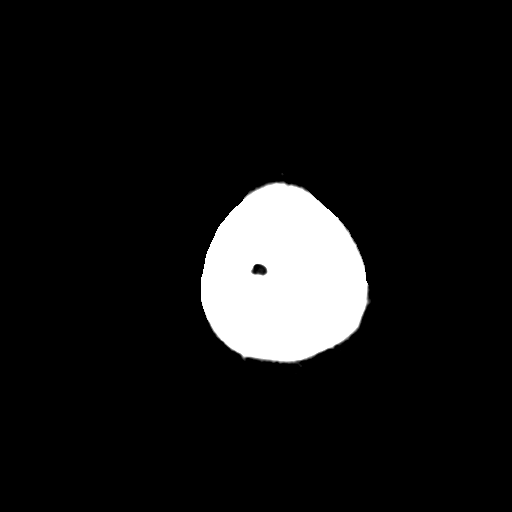

[15 of 30 positions shown; findings below may reference images not displayed]

PROCEDURE:     CT  - CT HEAD WITHOUT CONTRAST  - [DATE]  [DATE]

RESULT:     Axial noncontrast CT scanning was performed through the brain
with reconstructions at 5 mm intervals and slice thicknesses. There are no
previous studies for comparison.

The ventricles are normal in size and position. There is no intracranial
hemorrhage nor intracranial mass effect. There is subtle decreased density
in the deep white matter of both cerebral hemispheres. This is most
conspicuous on the right on image 19. There is no shift of the midline.
There is no evidence of an acute intracranial hemorrhage. No abnormal
intracranial calcifications are present. The cerebellum and brainstem
exhibit no acute abnormality.

At bone window settings there is mucoperiosteal thickening within the
ethmoid and right maxillary sinuses. The mastoid air cells are clear as are
the frontal and sphenoid sinuses.
IMPRESSION: 1. There is no evidence of an acute ischemic or hemorrhagic infarction.
2. There is subtle nonspecific decreased density in the deep white matter of
both cerebral hemispheres. This could reflect the sequelae of chronic small
vessel ischemia especially if there is a history of uncontrolled
hypertension. Correlation with the patient's clinical history as well as
acute symptoms is needed.
2. Mucoperiosteal thickening within the ethmoid and right maxillary sinuses
likely reflects acute sinus inflammation. This may be related to the
patient's symptoms as well.

[REDACTED]

## 2013-02-23 ENCOUNTER — Emergency Department: Payer: Self-pay | Admitting: Emergency Medicine

## 2013-02-23 LAB — COMPREHENSIVE METABOLIC PANEL
Albumin: 3.7 g/dL (ref 3.4–5.0)
Alkaline Phosphatase: 61 U/L (ref 50–136)
Anion Gap: 3 — ABNORMAL LOW (ref 7–16)
BUN: 11 mg/dL (ref 7–18)
Bilirubin,Total: 0.2 mg/dL (ref 0.2–1.0)
EGFR (Non-African Amer.): >60
Glucose: 75 mg/dL (ref 65–99)
Osmolality: 279 (ref 275–301)
Potassium: 3.5 mmol/L (ref 3.5–5.1)
SGOT(AST): 21 U/L (ref 15–37)
Total Protein: 7 g/dL (ref 6.4–8.2)

## 2013-02-23 LAB — CBC
HCT: 44 % (ref 40.0–52.0)
MCV: 86 fL (ref 80–100)
Platelet: 201 10*3/uL (ref 150–440)
RBC: 5.1 10*6/uL (ref 4.40–5.90)
RDW: 13.8 % (ref 11.5–14.5)
WBC: 7.9 10*3/uL (ref 3.8–10.6)

## 2013-02-23 LAB — CK TOTAL AND CKMB (NOT AT ARMC)
CK, Total: 79 U/L (ref 35–232)
CK-MB: 0.6 ng/mL (ref 0.5–3.6)

## 2013-02-23 IMAGING — CR DG CHEST 2V
1 series · 2 of 2 positions shown · non-contrast
Comparison: none

REASON FOR EXAM: palpitations
COMMENTS:

PROCEDURE:     DXR - DXR CHEST PA (OR AP) AND LATERAL  - [DATE] [DATE]
RESULT:     History: Palpitations.
Comparison Study: No prior.

[Series 1: w chest pa · 0.14mm/px · 2 of 2 slices shown]
[im 1/2]
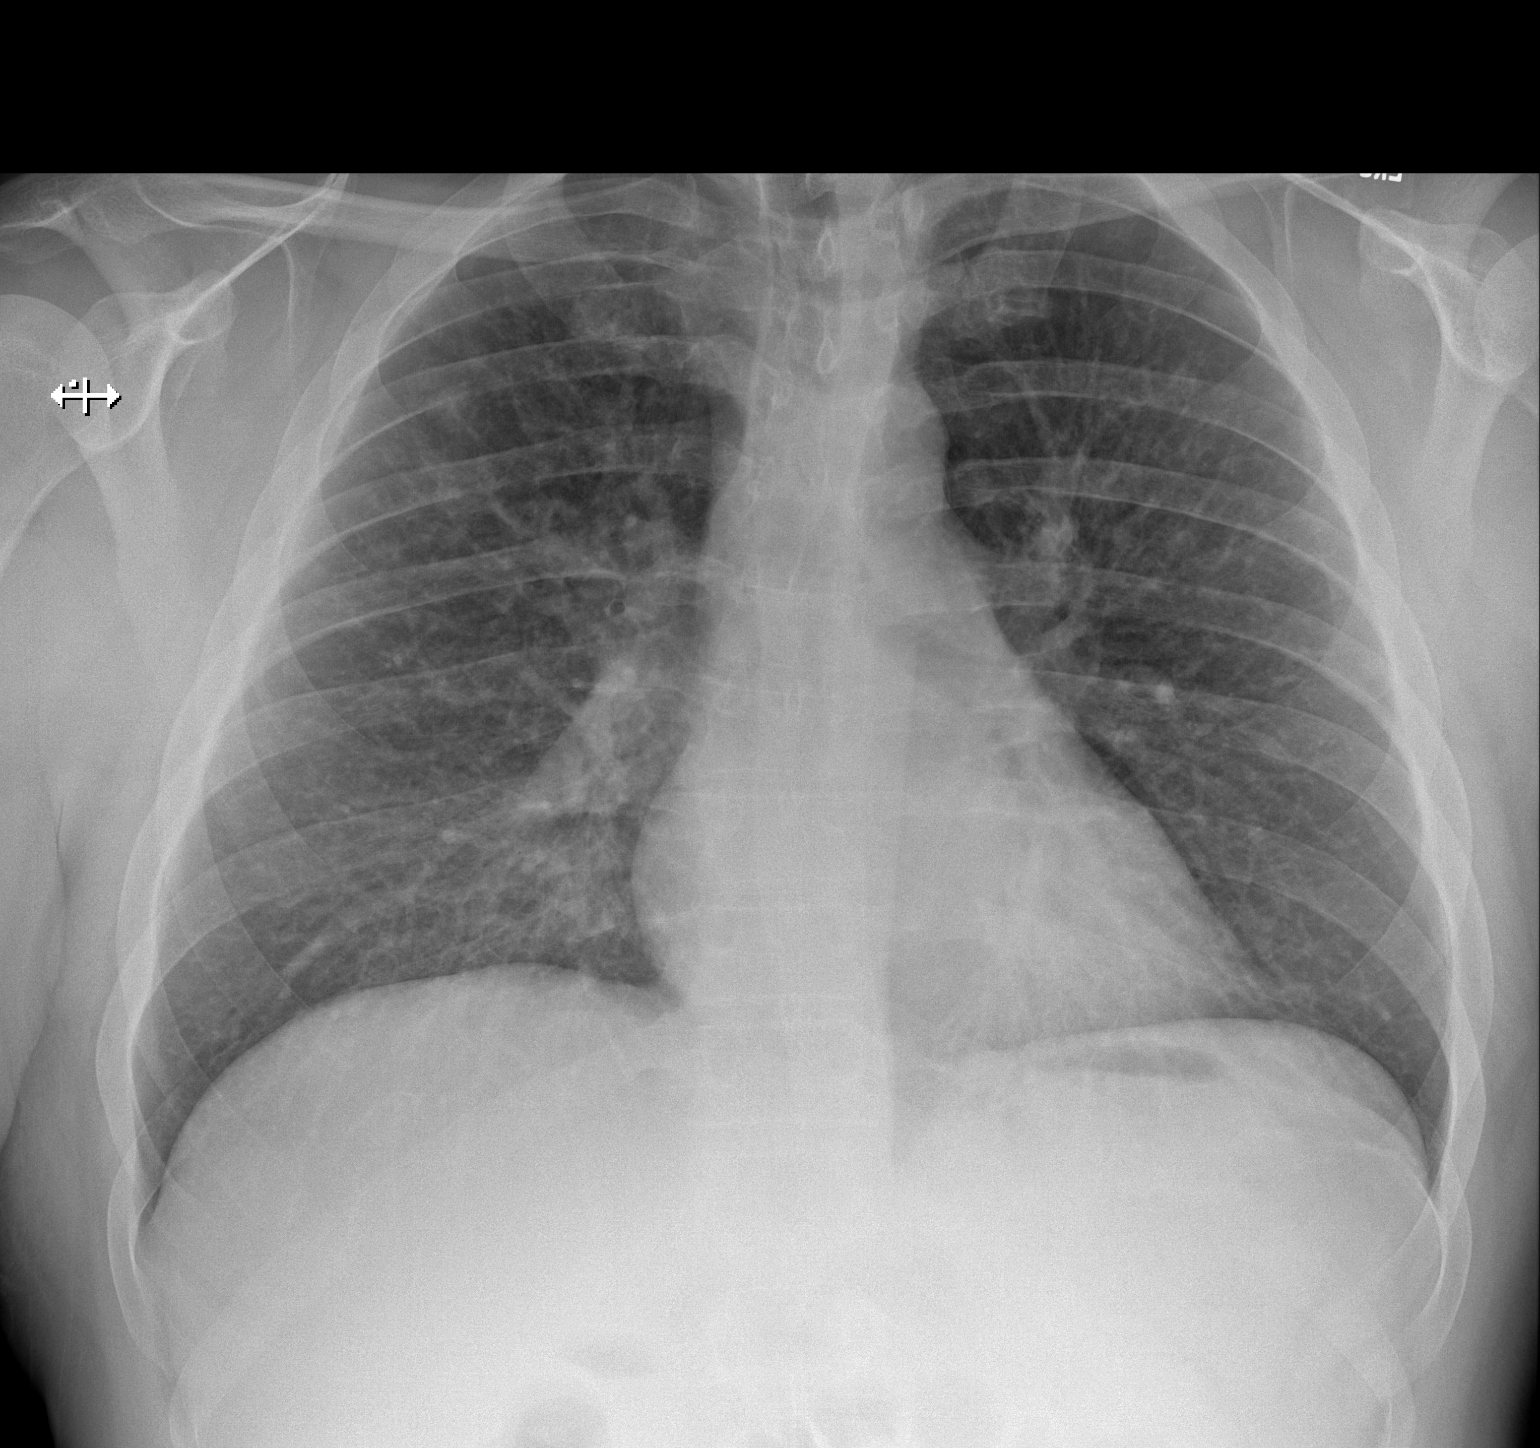
[im 2/2]
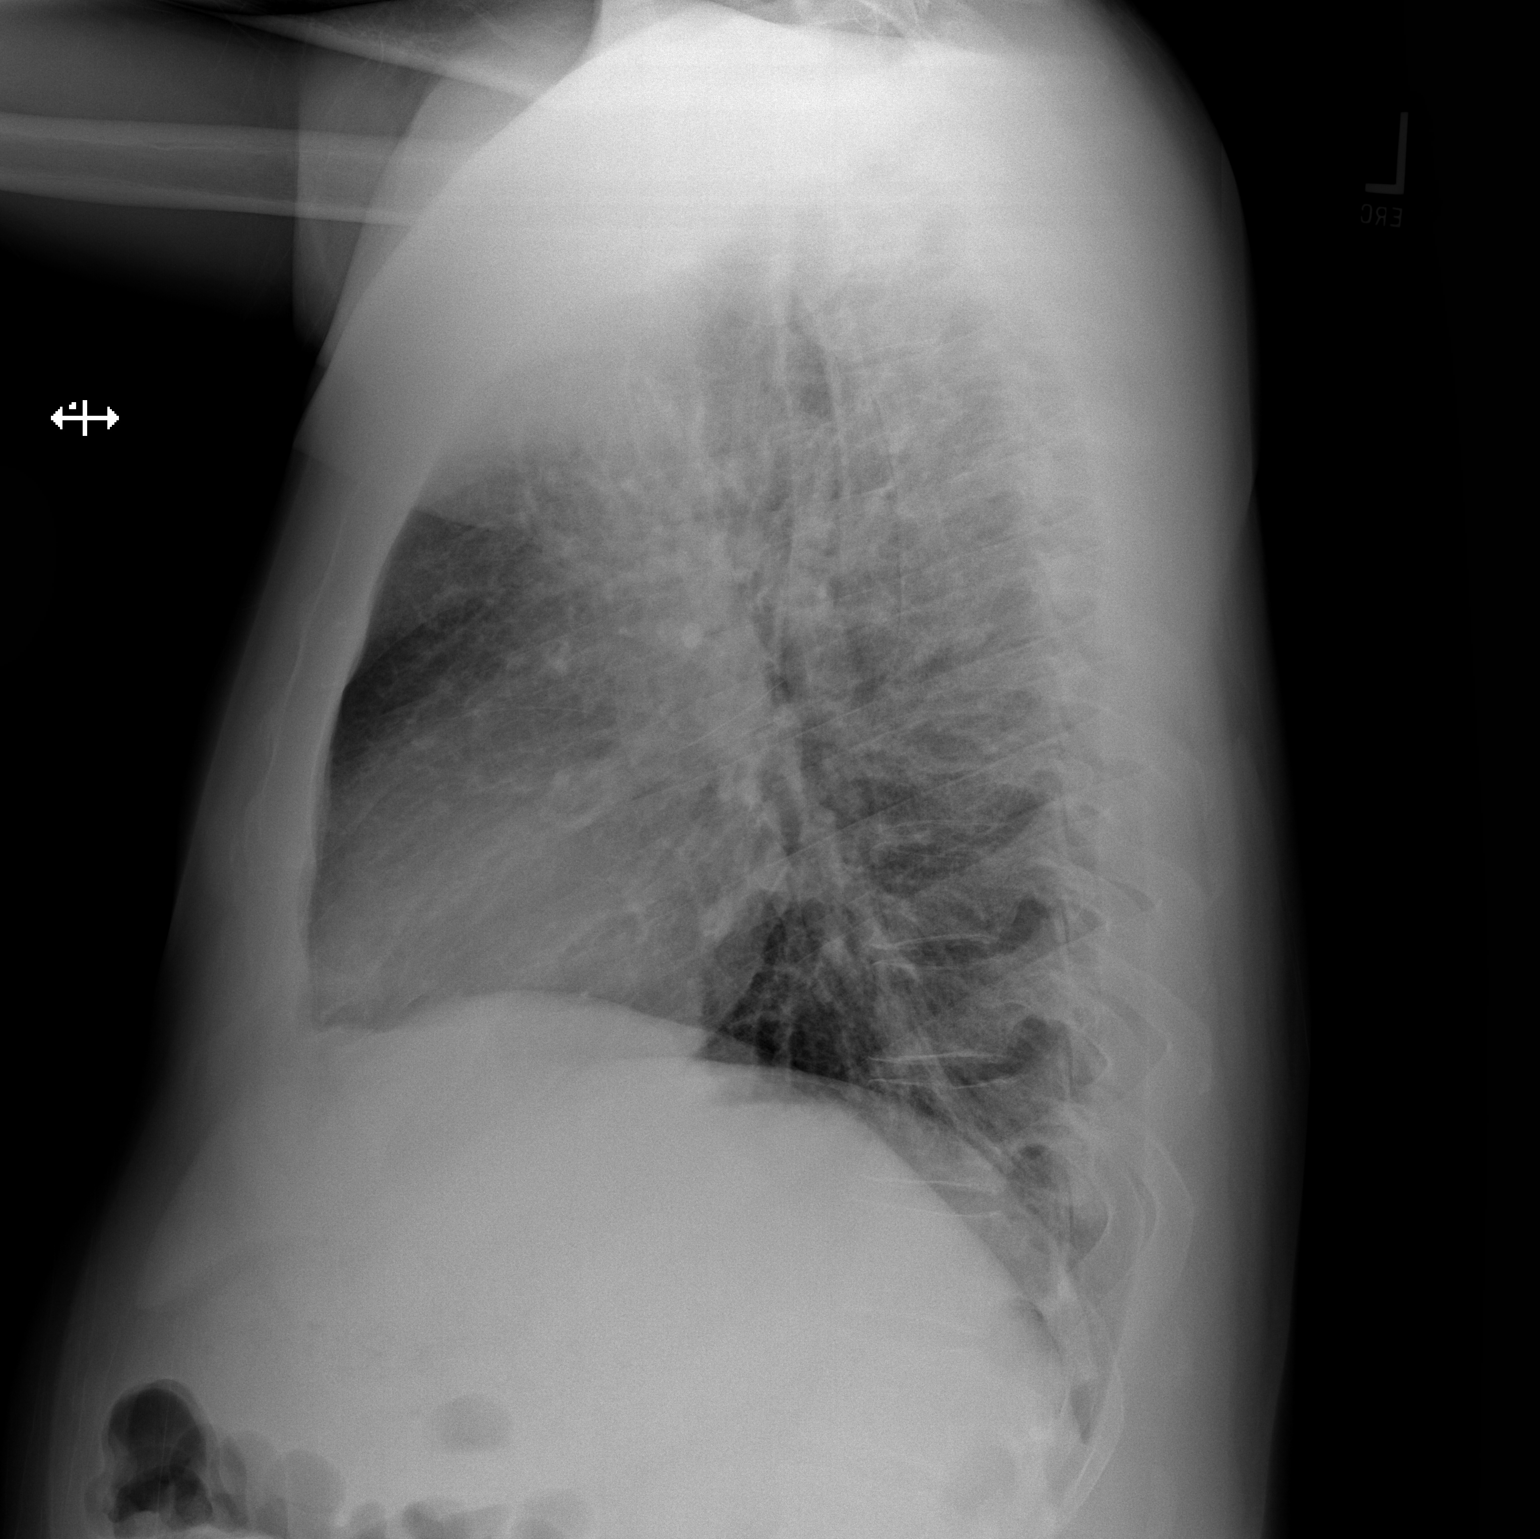

[2 of 2 positions shown; findings below may reference images not displayed]

FINDINGS: Mild interstitial prominence with nodularity noted. Pneumonitis
could present in this fashion. Active granulomatous disease cannot be
excluded. Nodular density is noted in the  right upper lobe. Close followup
chest x-rays to demonstrate clearing suggested. Heart size noted.
IMPRESSION: Nodular interstitial infiltrate for which followup chest
x-ray suggested to demonstrate clearing.

## 2013-02-24 LAB — CK TOTAL AND CKMB (NOT AT ARMC): CK-MB: <0.5 ng/mL — ABNORMAL LOW (ref 0.5–3.6)

## 2013-02-24 LAB — TROPONIN I: Troponin-I: <0.02 ng/mL

## 2014-10-15 IMAGING — US US ABDOMEN LIMITED
1 series · 14 of 25 positions shown · non-contrast
Comparison: CT abdomen pelvis [DATE]

CLINICAL DATA: Right abdomen pain since yesterday.

EXAM:
US ABDOMEN LIMITED - RIGHT UPPER QUADRANT

[Series 1: us abdomen limited · 0.20mm/px · 14 of 50 slices shown]
[im 1/50]
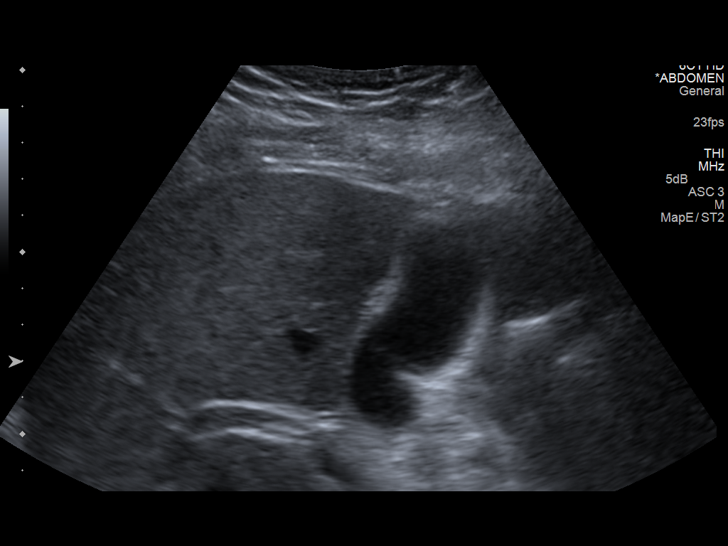
[im 5/50]
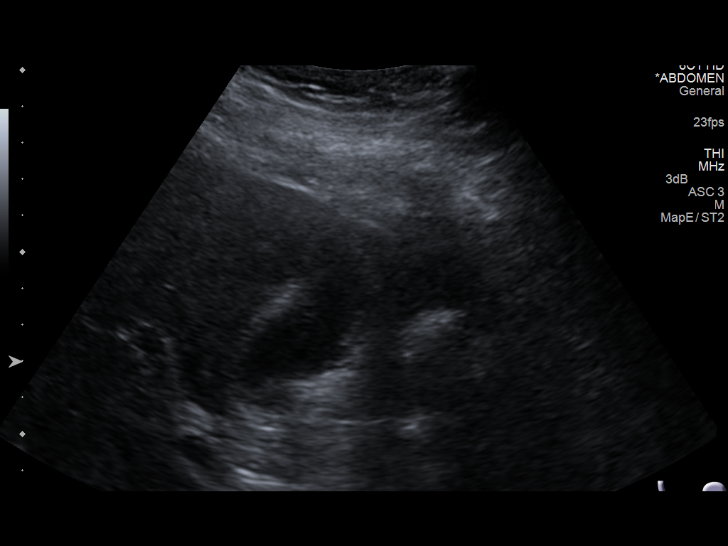
[im 9/50]
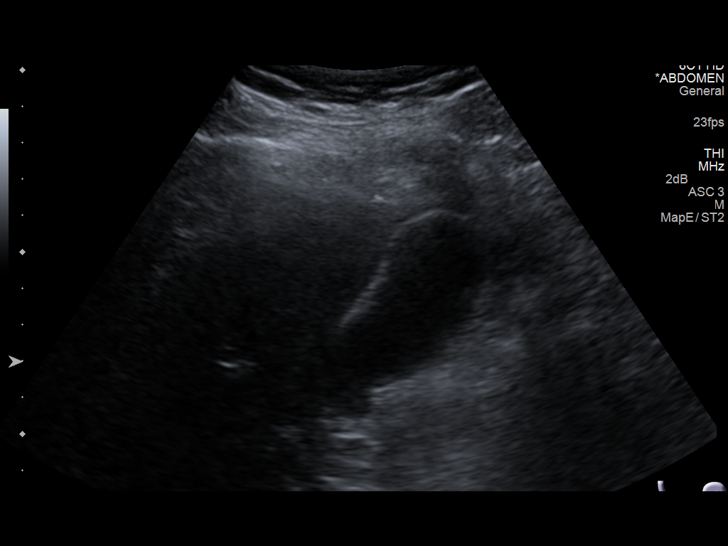
[im 13/50]
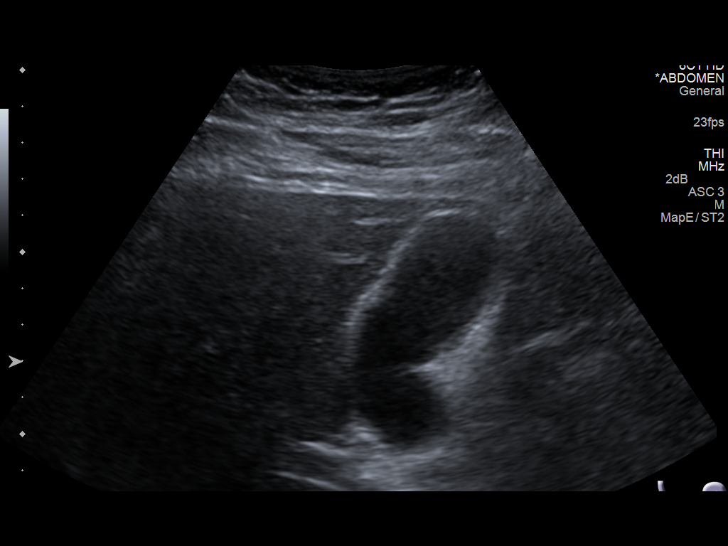
[im 17/50]
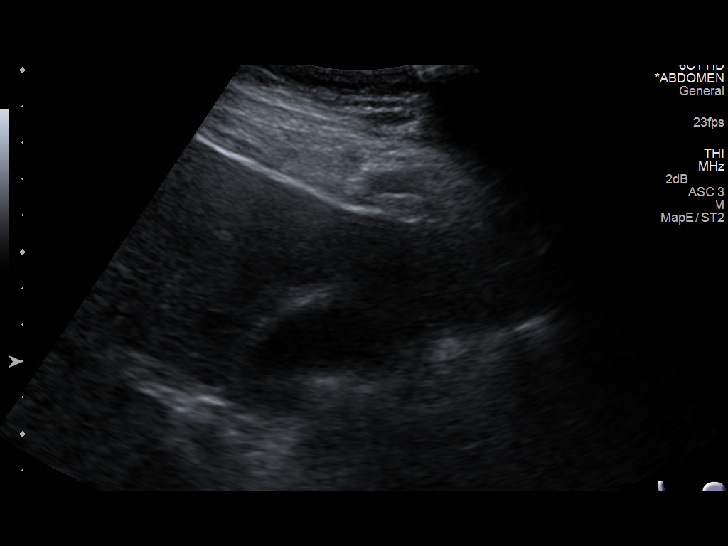
[im 19/50]
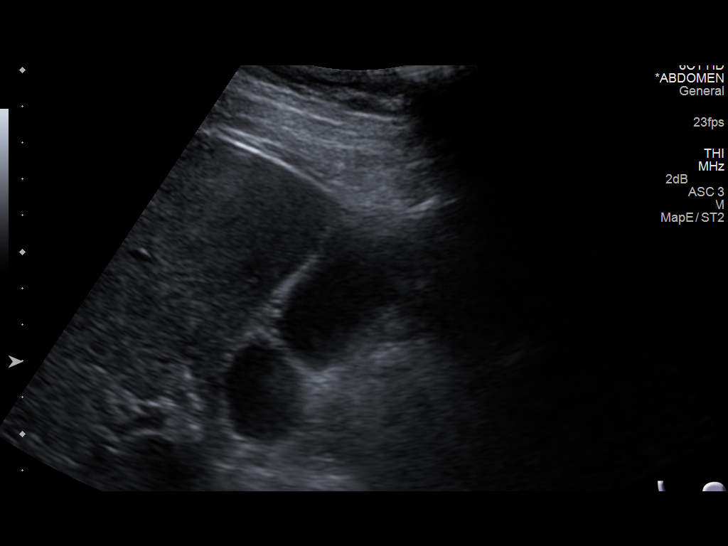
[im 23/50]
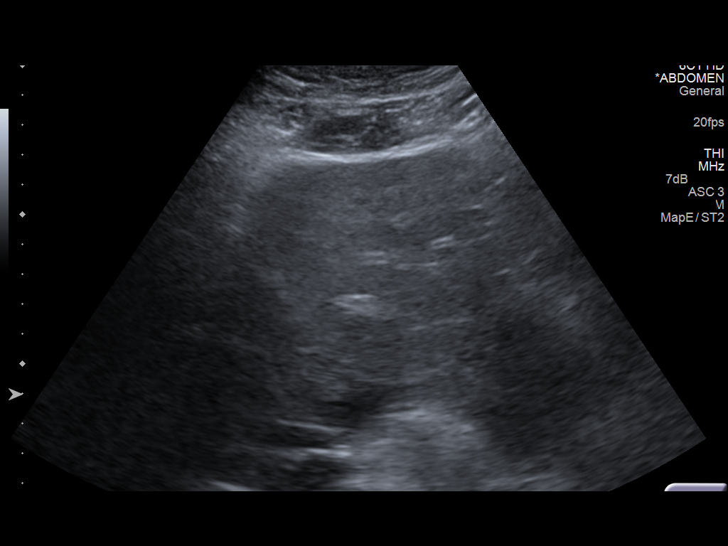
[im 27/50]
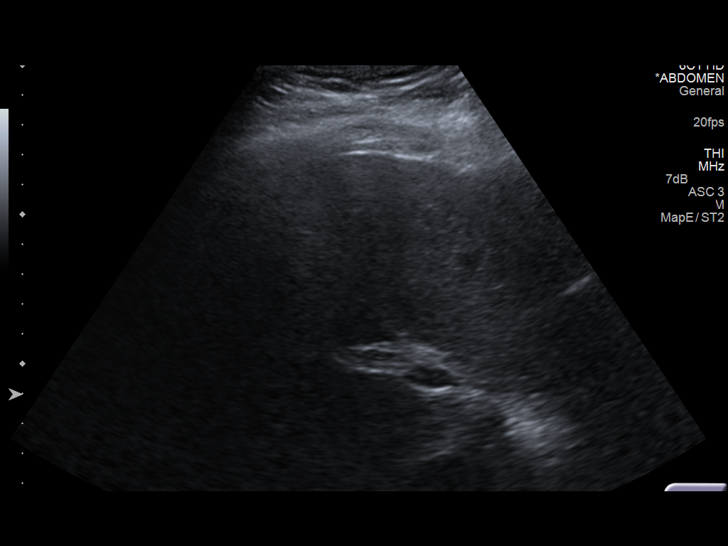
[im 31/50]
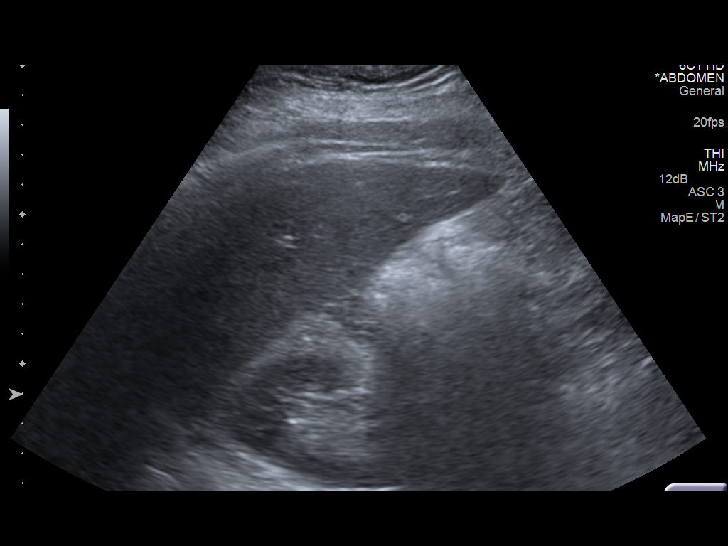
[im 33/50]
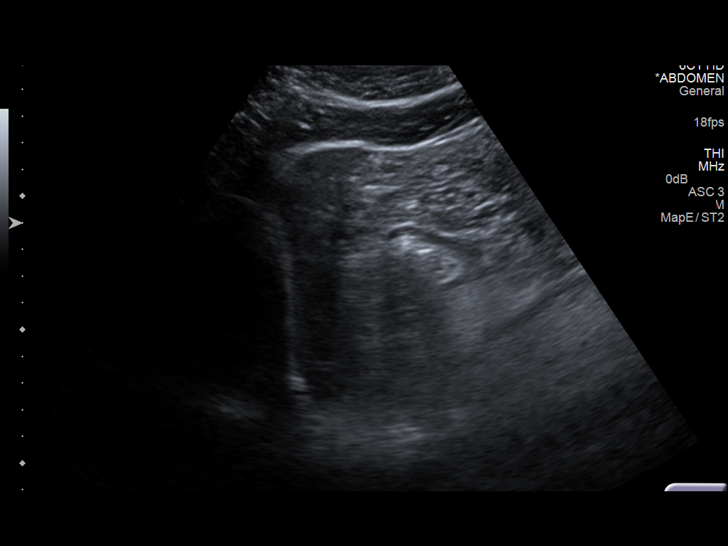
[im 37/50]
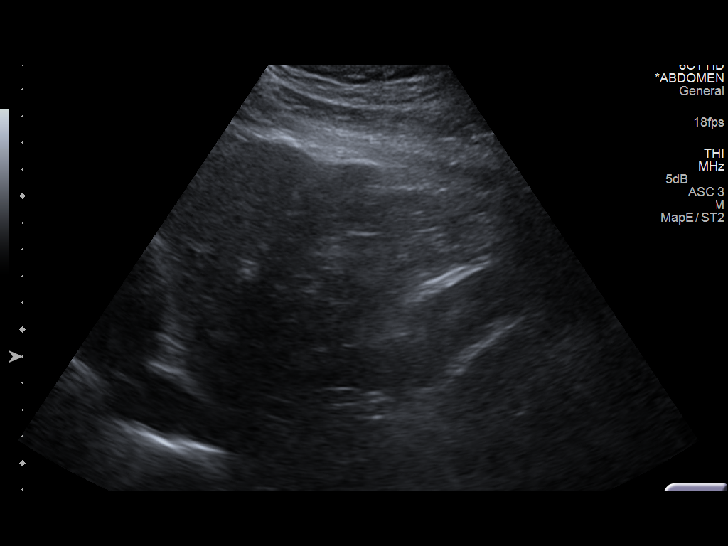
[im 41/50]
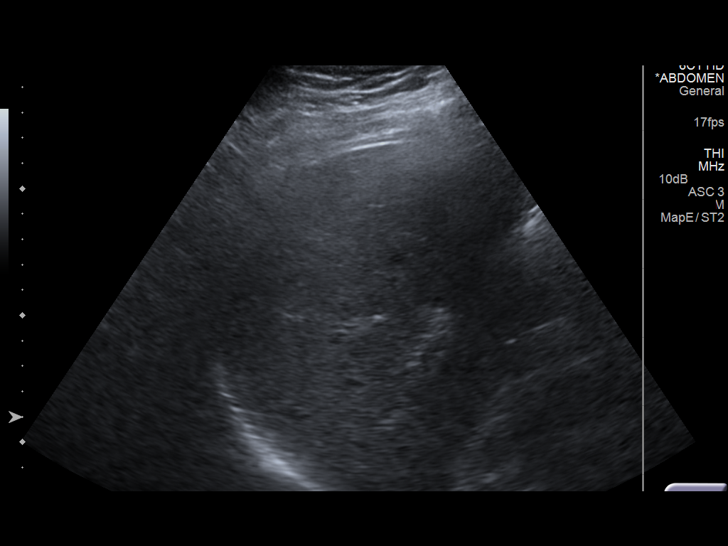
[im 45/50]
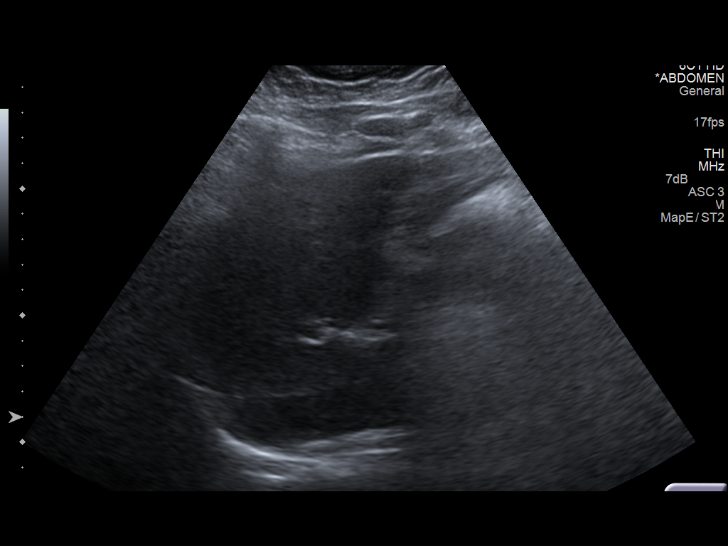
[im 50/50]
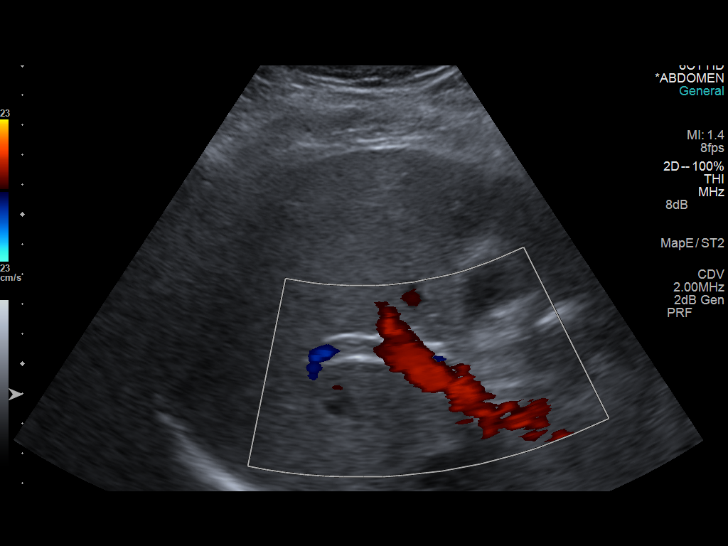

[14 of 25 positions shown; findings below may reference images not displayed]

FINDINGS: Gallbladder:

No gallstones or wall thickening visualized. No sonographic Murphy
sign noted.

Common bile duct:

Diameter: 2 mm

Liver:

No focal lesion identified. There is mild diffuse increased
echotexture of the liver.
IMPRESSION: The gallbladder is normal. There is no sonographic evidence of acute
cholecystitis.

Probable fatty infiltration of liver.

## 2015-03-03 DIAGNOSIS — K5720 Diverticulitis of large intestine with perforation and abscess without bleeding: Secondary | ICD-10-CM

## 2015-03-03 HISTORY — DX: Diverticulitis of large intestine with perforation and abscess without bleeding: K57.20

## 2015-03-12 ENCOUNTER — Inpatient Hospital Stay (HOSPITAL_COMMUNITY)
Admission: EM | Admit: 2015-03-12 | Discharge: 2015-03-17 | DRG: 391 | Disposition: A | Discharge status: Home / Self Care | Payer: Medicaid Other | Attending: General Surgery | Admitting: General Surgery

## 2015-03-12 ENCOUNTER — Emergency Department (HOSPITAL_COMMUNITY): Payer: Medicaid Other

## 2015-03-12 ENCOUNTER — Encounter (HOSPITAL_COMMUNITY): Payer: Self-pay

## 2015-03-12 DIAGNOSIS — K529 Noninfective gastroenteritis and colitis, unspecified: Secondary | ICD-10-CM | POA: Diagnosis present

## 2015-03-12 DIAGNOSIS — R51 Headache: Secondary | ICD-10-CM | POA: Diagnosis not present

## 2015-03-12 DIAGNOSIS — Z9852 Vasectomy status: Secondary | ICD-10-CM

## 2015-03-12 DIAGNOSIS — F17210 Nicotine dependence, cigarettes, uncomplicated: Secondary | ICD-10-CM | POA: Diagnosis present

## 2015-03-12 DIAGNOSIS — K5732 Diverticulitis of large intestine without perforation or abscess without bleeding: Secondary | ICD-10-CM | POA: Diagnosis present

## 2015-03-12 DIAGNOSIS — R059 Cough, unspecified: Secondary | ICD-10-CM

## 2015-03-12 DIAGNOSIS — M1990 Unspecified osteoarthritis, unspecified site: Secondary | ICD-10-CM | POA: Diagnosis present

## 2015-03-12 DIAGNOSIS — R05 Cough: Secondary | ICD-10-CM

## 2015-03-12 DIAGNOSIS — K631 Perforation of intestine (nontraumatic): Secondary | ICD-10-CM | POA: Diagnosis present

## 2015-03-12 DIAGNOSIS — G5600 Carpal tunnel syndrome, unspecified upper limb: Secondary | ICD-10-CM | POA: Diagnosis present

## 2015-03-12 DIAGNOSIS — R109 Unspecified abdominal pain: Secondary | ICD-10-CM | POA: Diagnosis not present

## 2015-03-12 HISTORY — DX: Unspecified osteoarthritis, unspecified site: M19.90

## 2015-03-12 LAB — CBC WITH DIFFERENTIAL/PLATELET
BASOS PCT: 0 % (ref 0–1)
Basophils Absolute: 0 10*3/uL (ref 0.0–0.1)
EOS ABS: 0.1 10*3/uL (ref 0.0–0.7)
EOS PCT: 0 % (ref 0–5)
HEMATOCRIT: 46.1 % (ref 39.0–52.0)
HEMOGLOBIN: 16.3 g/dL (ref 13.0–17.0)
LYMPHS PCT: 6 % — AB (ref 12–46)
Lymphs Abs: 1.1 10*3/uL (ref 0.7–4.0)
MCH: 29.8 pg (ref 26.0–34.0)
MCHC: 35.4 g/dL (ref 30.0–36.0)
MCV: 84.3 fL (ref 78.0–100.0)
MONO ABS: 1.8 10*3/uL — AB (ref 0.1–1.0)
Monocytes Relative: 10 % (ref 3–12)
Neutro Abs: 14.9 10*3/uL — ABNORMAL HIGH (ref 1.7–7.7)
Neutrophils Relative %: 84 % — ABNORMAL HIGH (ref 43–77)
PLATELETS: 225 10*3/uL (ref 150–400)
RBC: 5.47 MIL/uL (ref 4.22–5.81)
RDW: 13.1 % (ref 11.5–15.5)
WBC: 17.9 10*3/uL — AB (ref 4.0–10.5)

## 2015-03-12 LAB — COMPREHENSIVE METABOLIC PANEL
ALBUMIN: 4 g/dL (ref 3.5–5.0)
ALT: 18 U/L (ref 17–63)
ANION GAP: 12 (ref 5–15)
AST: 16 U/L (ref 15–41)
Alkaline Phosphatase: 66 U/L (ref 38–126)
BILIRUBIN TOTAL: 1 mg/dL (ref 0.3–1.2)
BUN: 11 mg/dL (ref 6–20)
CHLORIDE: 104 mmol/L (ref 101–111)
CO2: 20 mmol/L — ABNORMAL LOW (ref 22–32)
Calcium: 9.2 mg/dL (ref 8.9–10.3)
Creatinine, Ser: 0.91 mg/dL (ref 0.61–1.24)
GFR calc Af Amer: >60 mL/min (ref >60)
GFR calc non Af Amer: >60 mL/min (ref >60)
GLUCOSE: 118 mg/dL — AB (ref 65–99)
Potassium: 3.7 mmol/L (ref 3.5–5.1)
Sodium: 136 mmol/L (ref 135–145)
TOTAL PROTEIN: 7.1 g/dL (ref 6.5–8.1)

## 2015-03-12 LAB — URINALYSIS, ROUTINE W REFLEX MICROSCOPIC
BILIRUBIN URINE: NEGATIVE
GLUCOSE, UA: NEGATIVE mg/dL
HGB URINE DIPSTICK: NEGATIVE
Ketones, ur: 15 mg/dL — AB
LEUKOCYTES UA: NEGATIVE
NITRITE: NEGATIVE
PH: 7 (ref 5.0–8.0)
PROTEIN: NEGATIVE mg/dL
SPECIFIC GRAVITY, URINE: 1.027 (ref 1.005–1.030)
UROBILINOGEN UA: 1 mg/dL (ref 0.0–1.0)

## 2015-03-12 IMAGING — CT CT ABD-PELV W/ CM
2 of 5 series · 16 of 46 positions shown, 18 images · IV contrast (APPLIED)
Comparison: [DATE]

CLINICAL DATA: Lower abdominal pain since 5 a.m..

EXAM:
CT ABDOMEN AND PELVIS WITH CONTRAST
TECHNIQUE: Multidetector CT imaging of the abdomen and pelvis was performed
using the standard protocol following bolus administration of
intravenous contrast.
CONTRAST:  100mL OMNIPAQUE IOHEXOL 300 MG/ML  SOLN

[Series 2: abd/ pelvis 5.0 i30f 1 · axial · 0.92mm/px · z∈[-634,-199]mm · 13 of 99 slices shown, 15 images]
[im 6/99  soft-tissue]
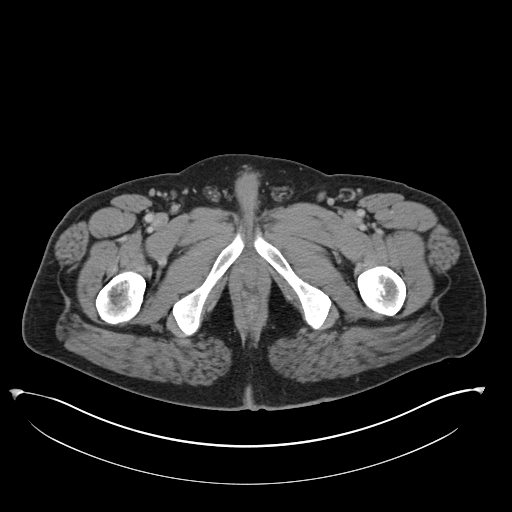
[im 6/99  bone]
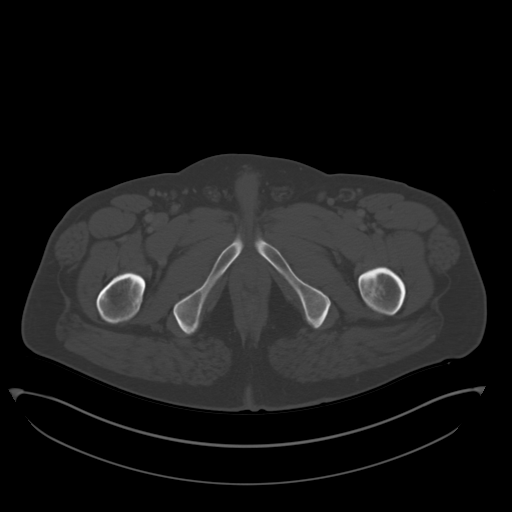
[im 16/99  soft-tissue]
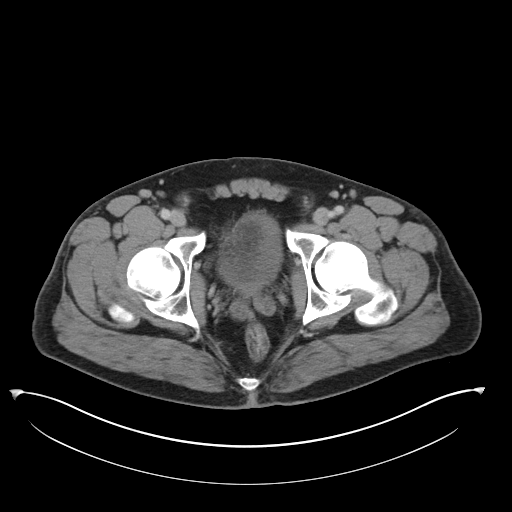
[im 21/99  soft-tissue]
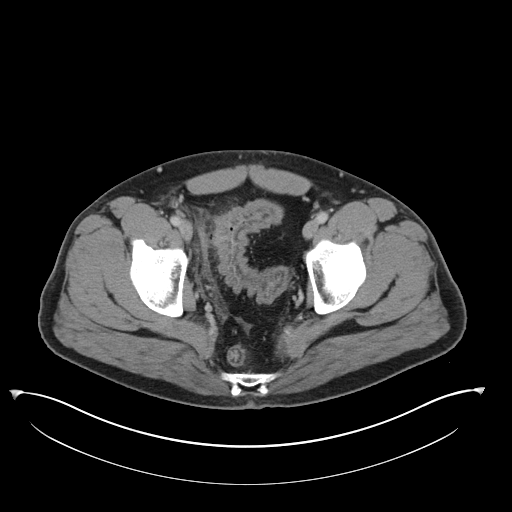
[im 26/99  soft-tissue]
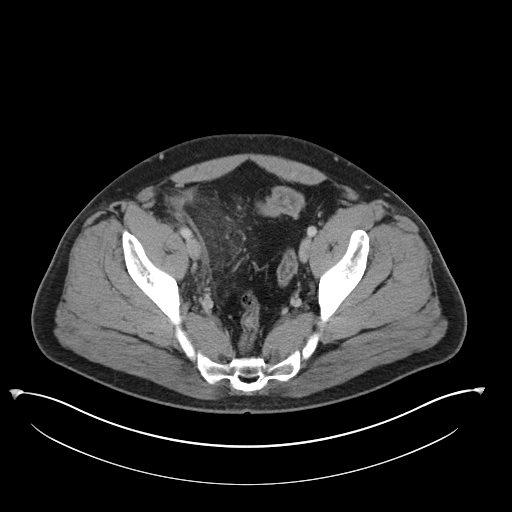
[im 37/99  soft-tissue]
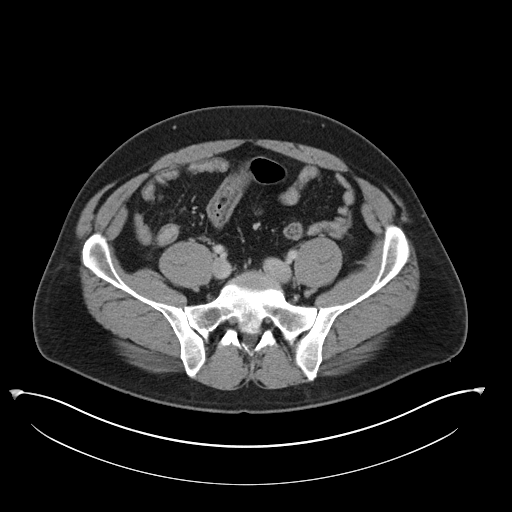
[im 42/99  soft-tissue]
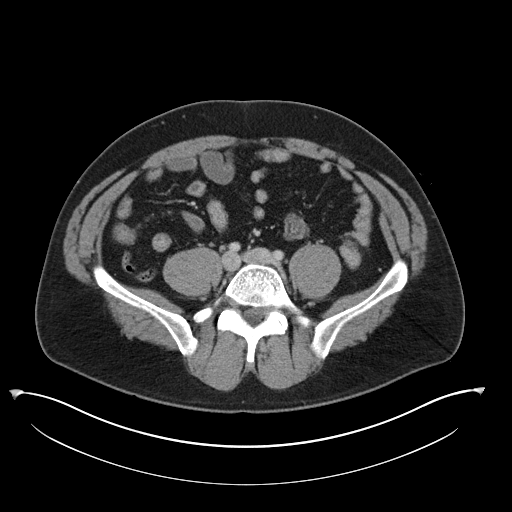
[im 52/99  soft-tissue]
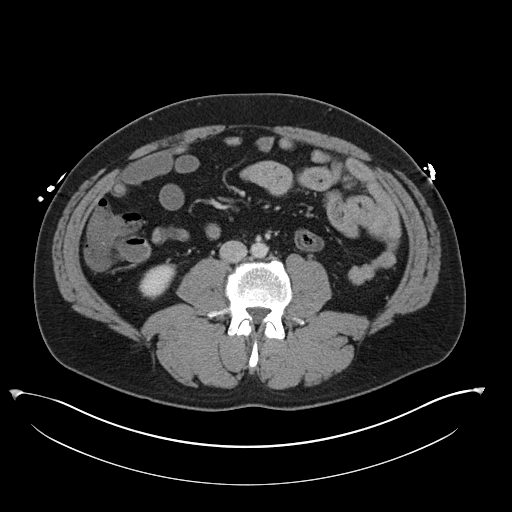
[im 57/99  soft-tissue]
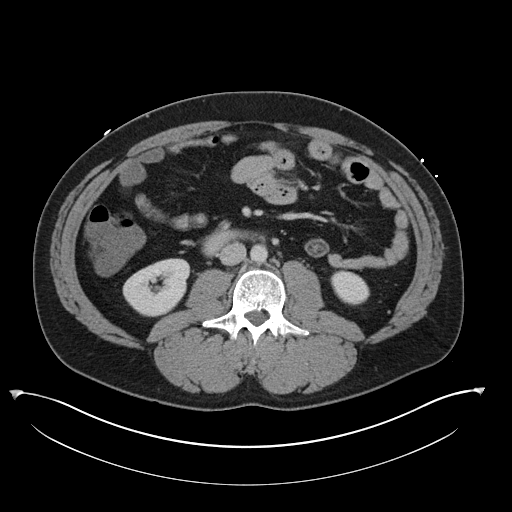
[im 62/99  soft-tissue]
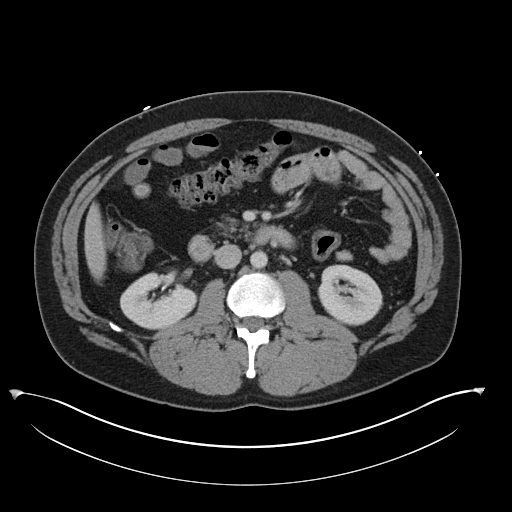
[im 62/99  bone]
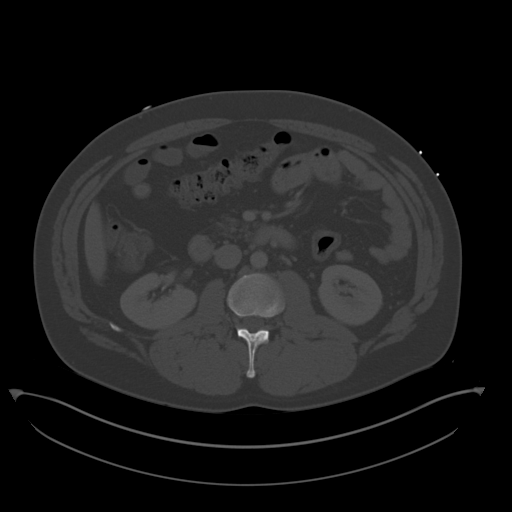
[im 73/99  soft-tissue]
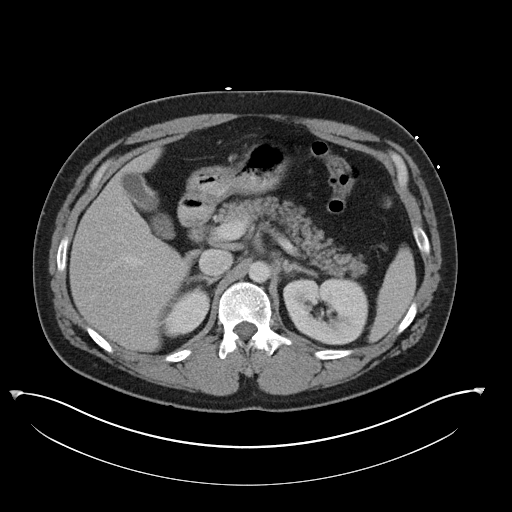
[im 78/99  soft-tissue]
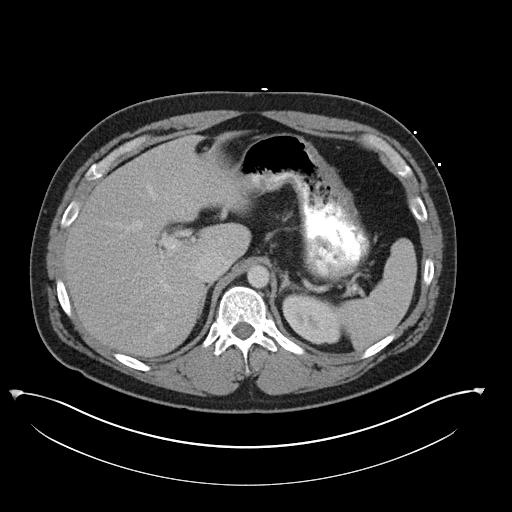
[im 83/99  soft-tissue]
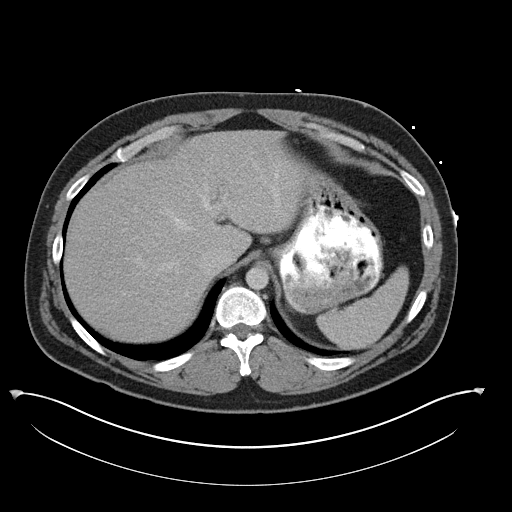
[im 93/99  soft-tissue]
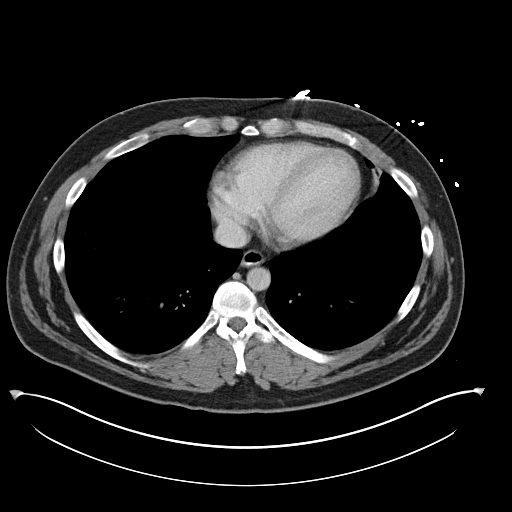

[Series 5: coronal soft tissue · coronal · 0.79mm/px · 3 of 101 slices shown]
[im 34/101  soft-tissue]
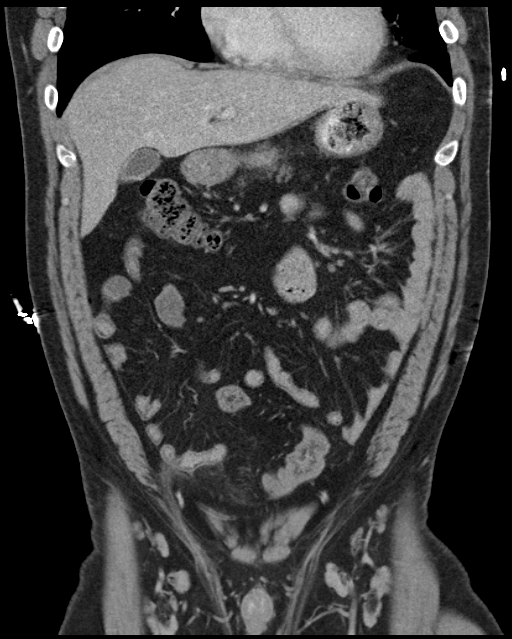
[im 45/101  soft-tissue]
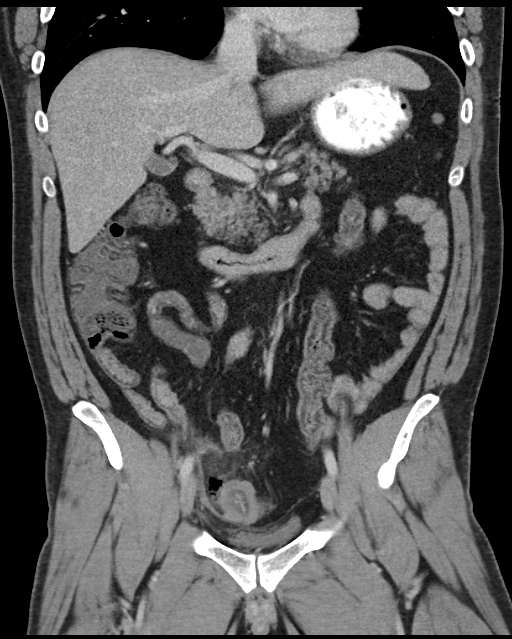
[im 56/101  soft-tissue]
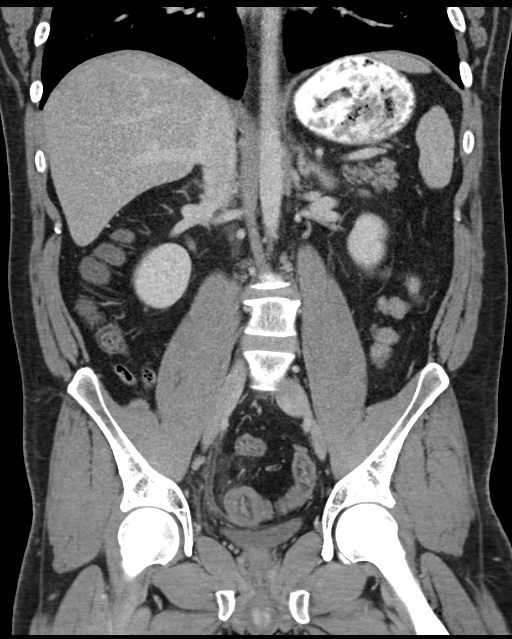

[16 of 46 positions shown; findings below may reference images not displayed]

FINDINGS: There is mural edema throughout the colon, greatest in the left
hemicolon. The appearance is consistent with colitis. There is a
focal contained perforation of the mid sigmoid colon with a small
volume of extraluminal air immediately adjacent to the right lateral
margin of the mid sigmoid. There is intense focal inflammatory
change in this area and there is a small volume of mesenteric fluid.
This is not the appearance of acute diverticulitis. It more likely
represents a focal contained perforation complicating a more
generalized colitis.

There is no bowel obstruction.

The stomach and small bowel appear unremarkable.

There are normal appearances of the liver, spleen, pancreas,
adrenals and kidneys.

Minimal linear atelectatic changes are present in the left base,
with otherwise unremarkable appearances of the lower chest.
IMPRESSION: Focal contained perforation at the right lateral aspect of the mid
sigmoid colon, superimposed on a more generalized colitis. There is
a small volume of mesenteric fluid in the area, but no drainable
abscess.

## 2015-03-12 IMAGING — CR DG CHEST 1V PORT
1 series · 1 of 1 positions shown · non-contrast
Comparison: [DATE]

CLINICAL DATA: Productive cough for 2 weeks.

EXAM:
PORTABLE CHEST - 1 VIEW

[AP]
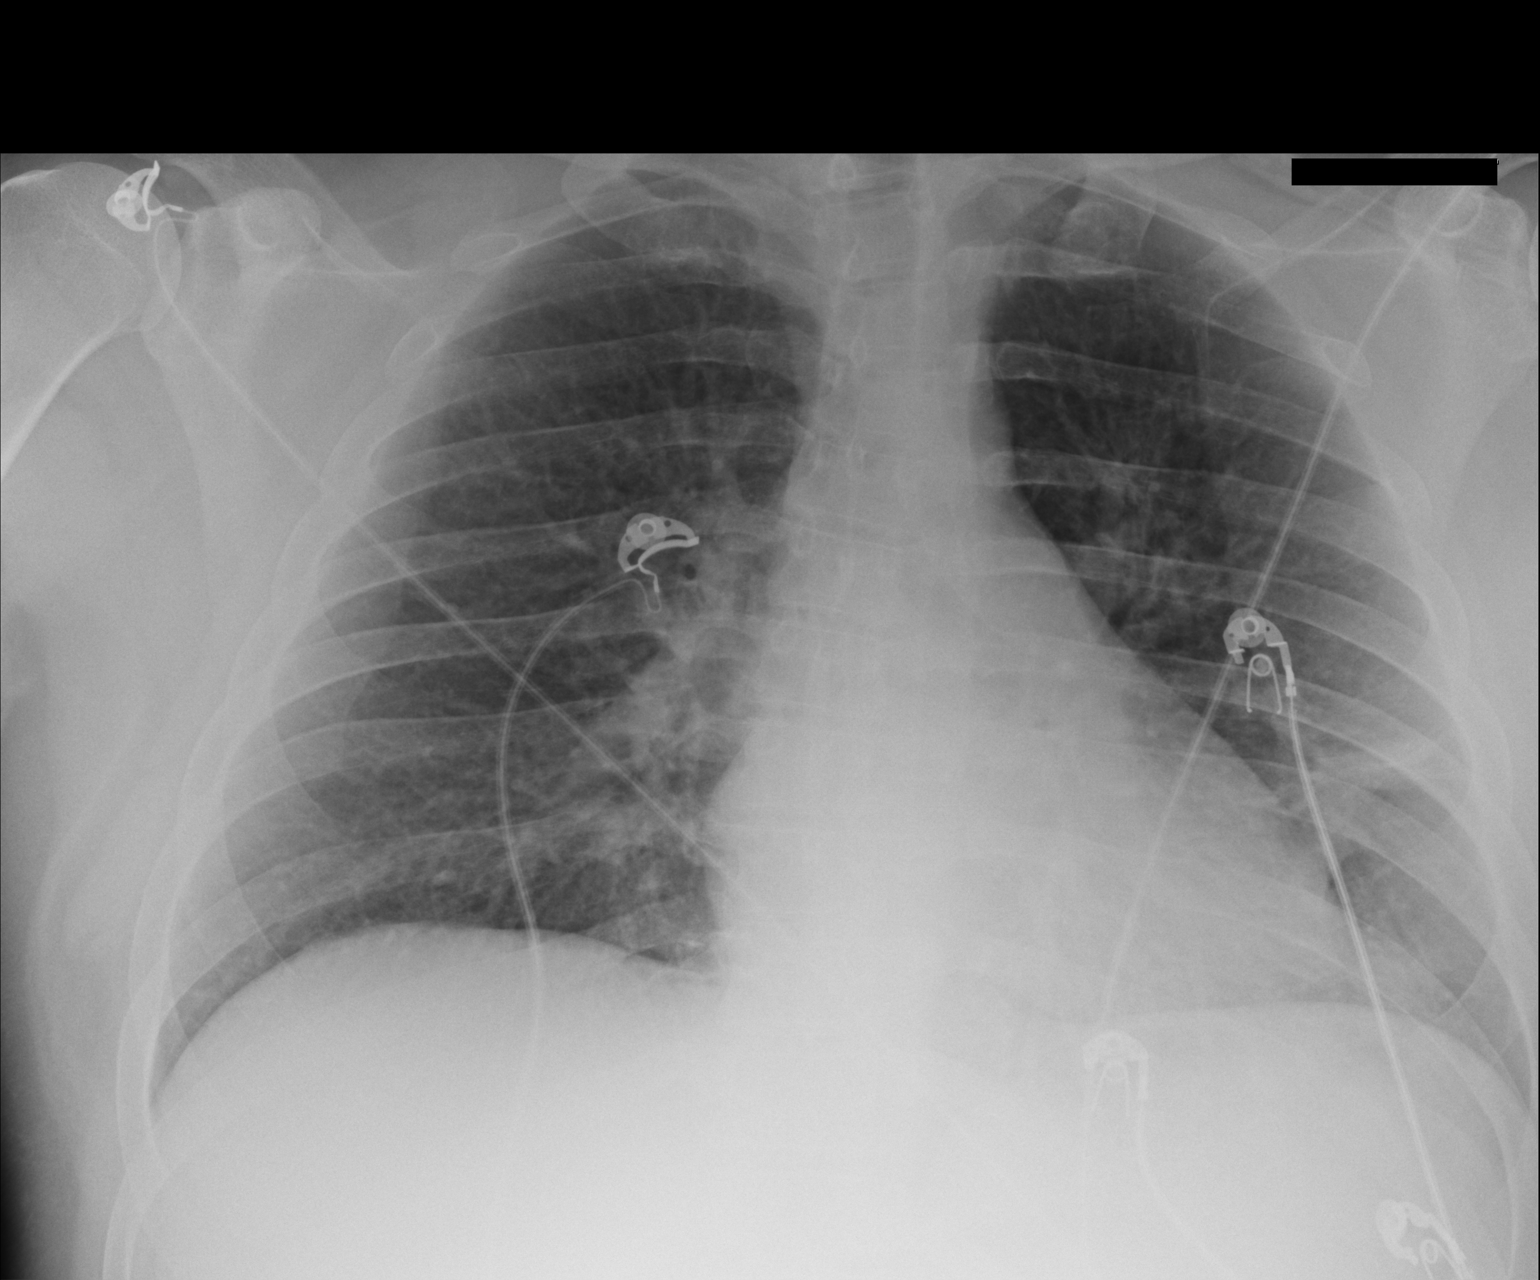

[1 of 1 positions shown; findings below may reference images not displayed]

FINDINGS: There is focal airspace opacity in the lateral left lung which may
represent infiltrate or atelectasis. The right lung is clear. There
is no large effusion. Pulmonary vasculature is normal.
IMPRESSION: Focal atelectasis or infiltrate in the lateral left lung.

## 2015-03-12 MED ORDER — KCL IN DEXTROSE-NACL 20-5-0.45 MEQ/L-%-% IV SOLN
INTRAVENOUS | Status: DC
  Administered 2015-03-12 – 2015-03-15 (×6): via INTRAVENOUS
  Administered 2015-03-15: 100 mL via INTRAVENOUS
  Administered 2015-03-17: 05:00:00 via INTRAVENOUS
  Filled 2015-03-12 (×12): qty 1000

## 2015-03-12 MED ORDER — NICOTINE 21 MG/24HR TD PT24
21.0000 mg | MEDICATED_PATCH | Freq: Every day | TRANSDERMAL | Status: DC
  Administered 2015-03-12 – 2015-03-16 (×4): 21 mg via TRANSDERMAL
  Filled 2015-03-12 (×4): qty 1

## 2015-03-12 MED ORDER — ENOXAPARIN SODIUM 40 MG/0.4ML ~~LOC~~ SOLN
40.0000 mg | SUBCUTANEOUS | Status: DC
  Administered 2015-03-12 – 2015-03-16 (×4): 40 mg via SUBCUTANEOUS
  Filled 2015-03-12 (×4): qty 0.4

## 2015-03-12 MED ORDER — PIPERACILLIN-TAZOBACTAM 3.375 G IVPB 30 MIN
3.3750 g | Freq: Once | INTRAVENOUS | Status: AC
  Administered 2015-03-12: 3.375 g via INTRAVENOUS
  Filled 2015-03-12: qty 50

## 2015-03-12 MED ORDER — ONDANSETRON HCL 4 MG/2ML IJ SOLN
4.0000 mg | Freq: Four times a day (QID) | INTRAMUSCULAR | Status: DC | PRN
  Administered 2015-03-12 – 2015-03-14 (×8): 4 mg via INTRAVENOUS
  Filled 2015-03-12 (×9): qty 2

## 2015-03-12 MED ORDER — HYDROMORPHONE HCL 1 MG/ML IJ SOLN
0.5000 mg | INTRAMUSCULAR | Status: DC | PRN
  Administered 2015-03-12: 1 mg via INTRAVENOUS
  Filled 2015-03-12: qty 1

## 2015-03-12 MED ORDER — IOHEXOL 300 MG/ML  SOLN
100.0000 mL | Freq: Once | INTRAMUSCULAR | Status: AC | PRN
  Administered 2015-03-12: 100 mL via INTRAVENOUS

## 2015-03-12 MED ORDER — MORPHINE SULFATE 4 MG/ML IJ SOLN
8.0000 mg | Freq: Once | INTRAMUSCULAR | Status: AC
  Administered 2015-03-12: 8 mg via INTRAVENOUS
  Filled 2015-03-12: qty 2

## 2015-03-12 MED ORDER — PANTOPRAZOLE SODIUM 40 MG IV SOLR
40.0000 mg | Freq: Every day | INTRAVENOUS | Status: DC
  Administered 2015-03-12 – 2015-03-14 (×3): 40 mg via INTRAVENOUS
  Filled 2015-03-12 (×3): qty 40

## 2015-03-12 MED ORDER — SODIUM CHLORIDE 0.9 % IV BOLUS (SEPSIS)
1000.0000 mL | Freq: Once | INTRAVENOUS | Status: AC
  Administered 2015-03-12: 1000 mL via INTRAVENOUS

## 2015-03-12 MED ORDER — DIPHENHYDRAMINE HCL 50 MG/ML IJ SOLN: 25.0000 mg | Freq: Four times a day (QID) | INTRAMUSCULAR | Status: DC | PRN

## 2015-03-12 MED ORDER — ACETAMINOPHEN 325 MG PO TABS
325.0000 mg | ORAL_TABLET | Freq: Four times a day (QID) | ORAL | Status: DC | PRN
  Administered 2015-03-12 – 2015-03-17 (×9): 650 mg via ORAL
  Filled 2015-03-12 (×9): qty 2

## 2015-03-12 MED ORDER — PIPERACILLIN-TAZOBACTAM 3.375 G IVPB
3.3750 g | Freq: Three times a day (TID) | INTRAVENOUS | Status: DC
  Administered 2015-03-12 – 2015-03-15 (×9): 3.375 g via INTRAVENOUS
  Filled 2015-03-12 (×12): qty 50

## 2015-03-12 MED ORDER — HYDROMORPHONE HCL 1 MG/ML IJ SOLN
1.0000 mg | INTRAMUSCULAR | Status: DC | PRN
  Administered 2015-03-12 – 2015-03-13 (×8): 2 mg via INTRAVENOUS
  Administered 2015-03-13: 1 mg via INTRAVENOUS
  Administered 2015-03-13 – 2015-03-14 (×7): 2 mg via INTRAVENOUS
  Administered 2015-03-14: 1 mg via INTRAVENOUS
  Administered 2015-03-14 – 2015-03-15 (×6): 2 mg via INTRAVENOUS
  Filled 2015-03-12 (×12): qty 2
  Filled 2015-03-12 (×2): qty 1
  Filled 2015-03-12 (×10): qty 2

## 2015-03-12 MED ORDER — METRONIDAZOLE IN NACL 5-0.79 MG/ML-% IV SOLN
500.0000 mg | Freq: Once | INTRAVENOUS | Status: AC
  Administered 2015-03-12: 500 mg via INTRAVENOUS
  Filled 2015-03-12: qty 100

## 2015-03-12 NOTE — ED Notes (Signed)
Pt arrived via EMS c/o bilateral lower abdominal pain since 5am yesterday morning.  Pt reported blood with bowel movement at 11pm and syncopal episode, pt remembers waking up on bathroom floor.  EMS gave 228mcg fentanyl.

## 2015-03-12 NOTE — ED Provider Notes (Signed)
CSN: [id number]     Arrival date & time 03/12/15  0125 History  This chart was scribed for Julianne Rice, MD by Delphia Grates, ED Scribe. This patient was seen in room A09C/A09C and the patient's care was started at 1:46 AM.   Chief Complaint  Patient presents with  . Abdominal Pain    The history is provided by the patient. No language interpreter was used.     HPI Comments: Eric Peck is a 39 y.o. male, brought in by ambulance, who presents to the Emergency Department complaining of gradually worsening, constant, 10/10, RLQ abdominal pain that started yesterday morning. There is associated chills and decreased appetite and states the pain is worse with movement and cough. Patient has tried stool softeners and reports having a BM without relief. He denies fever, nausea, or vomiting. Patient is a current PPD smoker. Ongoing nonproductive cough for several weeks. Given 250 mcg of fentanyl en route. Mild hypoxia noted responding to  O2.    Past Medical History  Diagnosis Date  . CTS (carpal tunnel syndrome)   . Arthritis    Past Surgical History  Procedure Laterality Date  . Vasectomy     Family History  Problem Relation Age of Onset  . COPD Mother   . Drug abuse Mother     EtOH  . Diabetes Father   . Kidney disease Father   . Eczema Brother   . Mental retardation Brother     Down's Syndrome   History  Substance Use Topics  . Smoking status: Current Every Day Smoker -- 1.00 packs/day    Types: Cigarettes  . Smokeless tobacco: Never Used  . Alcohol Use: No    Review of Systems  Constitutional: Positive for appetite change. Negative for fever and chills.  Respiratory: Positive for cough. Negative for shortness of breath.   Cardiovascular: Negative for chest pain.  Gastrointestinal: Positive for nausea and abdominal pain. Negative for vomiting, diarrhea and constipation.  Genitourinary: Negative for dysuria and flank pain.  Musculoskeletal: Negative for back  pain, neck pain and neck stiffness.  Skin: Negative for rash and wound.  Neurological: Positive for syncope. Negative for dizziness, weakness, light-headedness, numbness and headaches.  All other systems reviewed and are negative.     Allergies  Review of patient's allergies indicates no known allergies.  Home Medications   Prior to Admission medications   Medication Sig Start Date End Date Taking? Authorizing Provider  ibuprofen (ADVIL,MOTRIN) 200 MG tablet Take 800-1,400 mg by mouth every 8 (eight) hours as needed for moderate pain.   Yes Historical Provider, MD   Triage Vitals: BP 134/80 mmHg  Pulse 81  Temp(Src) 98.3 F (36.8 C) (Oral)  Resp 20  Ht 6\' 1"  (1.854 m)  Wt 223 lb (101.152 kg)  BMI 29.43 kg/m2  SpO2 92%  Physical Exam  Constitutional: He is oriented to person, place, and time. He appears well-developed and well-nourished. No distress.  HENT:  Head: Normocephalic and atraumatic.  Mouth/Throat: Oropharynx is clear and moist.  Eyes: EOM are normal. Pupils are equal, round, and reactive to light.  Neck: Normal range of motion. Neck supple.  No posterior midline cervical tenderness to palpation.  Cardiovascular: Normal rate and regular rhythm.   Pulmonary/Chest: Effort normal. No respiratory distress. He has wheezes (scattered wheezes). He has no rales.  Abdominal: Soft. Bowel sounds are normal. There is tenderness (diffuse tenderness to palpation especially in the right lower quadrant. Guarding with rebound tenderness.). There is rebound and guarding.  Musculoskeletal: Normal range of motion. He exhibits no edema or tenderness.  No lower extremity swelling or pain.  Neurological: He is alert and oriented to person, place, and time.  Moves all extremities without deficit. Sensation is grossly intact.  Skin: Skin is warm and dry. No rash noted. No erythema.  Psychiatric: He has a normal mood and affect. His behavior is normal.  Nursing note and vitals  reviewed.   ED Course  Procedures (including critical care time)  DIAGNOSTIC STUDIES: Oxygen Saturation is 92% on room air, adequate by my interpretation.    COORDINATION OF CARE: At 52 Discussed treatment plan with patient which includes imagine to r/o possible appendictis. Patient agrees.   Labs Review Labs Reviewed  CBC WITH DIFFERENTIAL/PLATELET - Abnormal; Notable for the following:    WBC 17.9 (*)    Neutrophils Relative % 84 (*)    Neutro Abs 14.9 (*)    Lymphocytes Relative 6 (*)    Monocytes Absolute 1.8 (*)    All other components within normal limits  COMPREHENSIVE METABOLIC PANEL - Abnormal; Notable for the following:    CO2 20 (*)    Glucose, Bld 118 (*)    All other components within normal limits  URINALYSIS, ROUTINE W REFLEX MICROSCOPIC (NOT AT [facility]) - Abnormal; Notable for the following:    Color, Urine AMBER (*)    Ketones, ur 15 (*)    All other components within normal limits    Imaging Review Ct Abdomen Pelvis W Contrast  03/12/2015   CLINICAL DATA:  Lower abdominal pain since 5 a.m.  EXAM: CT ABDOMEN AND PELVIS WITH CONTRAST  TECHNIQUE: Multidetector CT imaging of the abdomen and pelvis was performed using the standard protocol following bolus administration of intravenous contrast.  CONTRAST:  135mL OMNIPAQUE IOHEXOL 300 MG/ML  SOLN  COMPARISON:  04/28/2009  FINDINGS: There is mural edema throughout the colon, greatest in the left hemicolon. The appearance is consistent with colitis. There is a focal contained perforation of the mid sigmoid colon with a small volume of extraluminal air immediately adjacent to the right lateral margin of the mid sigmoid. There is intense focal inflammatory change in this area and there is a small volume of mesenteric fluid. This is not the appearance of acute diverticulitis. It more likely represents a focal contained perforation complicating a more generalized colitis.  There is no bowel obstruction.  The stomach and small  bowel appear unremarkable.  There are normal appearances of the liver, spleen, pancreas, adrenals and kidneys.  Minimal linear atelectatic changes are present in the left base, with otherwise unremarkable appearances of the lower chest.  IMPRESSION: Focal contained perforation at the right lateral aspect of the mid sigmoid colon, superimposed on a more generalized colitis. There is a small volume of mesenteric fluid in the area, but no drainable abscess.   Electronically Signed   By: Andreas Newport M.D.   On: 03/12/2015 02:58   Dg Chest Port 1 View  03/12/2015   CLINICAL DATA:  Productive cough for 2 weeks.  EXAM: PORTABLE CHEST - 1 VIEW  COMPARISON:  02/23/2013  FINDINGS: There is focal airspace opacity in the lateral left lung which may represent infiltrate or atelectasis. The right lung is clear. There is no large effusion. Pulmonary vasculature is normal.  IMPRESSION: Focal atelectasis or infiltrate in the lateral left lung.   Electronically Signed   By: Andreas Newport M.D.   On: 03/12/2015 02:17     EKG Interpretation   Date/Time:  Friday March 12 2015 01:43:14 EDT Ventricular Rate:  76 PR Interval:  160 QRS Duration: 86 QT Interval:  365 QTC Calculation: 410 R Axis:   61 Text Interpretation:  Sinus rhythm ST elev, probable normal early repol  pattern Confirmed by Lita Mains  MD, Sherri ([id number]) on 03/12/2015 2:03:17 AM      MDM   Final diagnoses:  Cough  Colitis  Large bowel perforation    I personally performed the services described in this documentation, which was scribed in my presence. The recorded information has been reviewed and is accurate.  Patient is more comfortable after IV pain medication. Discussed with Dr. Grandville Silos who will see patient in emergency department. The patient's hypoxia is likely due to pain medicine. Questionable infiltrate on x-ray but none seen on CT. Patient does have a colitis with perforation. He is receiving broad spectrum  antibiotics.   Julianne Rice, MD 03/12/15 [phone]

## 2015-03-12 NOTE — ED Notes (Signed)
Transporting patient to new room assignment. 

## 2015-03-12 NOTE — H&P (Signed)
Eric Peck is an 39 y.o. male.   Chief Complaint: Lower abdominal pain HPI: Eric Peck developed acute onset of lower abdominal pain early yesterday morning. For the past week, he has had fewer bowel movements than normal. He took some stool softeners as a result. The lower abdominal pain came on acutely and persisted so he came to the emergency department for further evaluation. Workup reveals leukocytosis of 17,900. CT scan of the abdomen and pelvis was performed demonstrating sigmoid colitis with small localized perforation. He has been stable in the emergency department.  Past Medical History  Diagnosis Date  . CTS (carpal tunnel syndrome)   . Arthritis     Past Surgical History  Procedure Laterality Date  . Vasectomy      Family History  Problem Relation Age of Onset  . COPD Mother   . Drug abuse Mother     EtOH  . Diabetes Father   . Kidney disease Father   . Eczema Brother   . Mental retardation Brother     Down's Syndrome   Social History:  reports that he has been smoking Cigarettes.  He has been smoking about 1.00 pack per day. He has never used smokeless tobacco. He reports that he does not drink alcohol or use illicit drugs.  Allergies: No Known Allergies   (Not in a hospital admission)  Results for orders placed or performed during the hospital encounter of 03/12/15 (from the past 48 hour(s))  CBC with Differential/Platelet     Status: Abnormal   Collection Time: 03/12/15  1:55 AM  Result Value Ref Range   WBC 17.9 (H) 4.0 - 10.5 K/uL   RBC 5.47 4.22 - 5.81 MIL/uL   Hemoglobin 16.3 13.0 - 17.0 g/dL   HCT 46.1 39.0 - 52.0 %   MCV 84.3 78.0 - 100.0 fL   MCH 29.8 26.0 - 34.0 pg   MCHC 35.4 30.0 - 36.0 g/dL   RDW 13.1 11.5 - 15.5 %   Platelets 225 150 - 400 K/uL   Neutrophils Relative % 84 (H) 43 - 77 %   Neutro Abs 14.9 (H) 1.7 - 7.7 K/uL   Lymphocytes Relative 6 (L) 12 - 46 %   Lymphs Abs 1.1 0.7 - 4.0 K/uL   Monocytes Relative 10 3 - 12 %   Monocytes  Absolute 1.8 (H) 0.1 - 1.0 K/uL   Eosinophils Relative 0 0 - 5 %   Eosinophils Absolute 0.1 0.0 - 0.7 K/uL   Basophils Relative 0 0 - 1 %   Basophils Absolute 0.0 0.0 - 0.1 K/uL  Comprehensive metabolic panel     Status: Abnormal   Collection Time: 03/12/15  1:55 AM  Result Value Ref Range   Sodium 136 135 - 145 mmol/L   Potassium 3.7 3.5 - 5.1 mmol/L   Chloride 104 101 - 111 mmol/L   CO2 20 (L) 22 - 32 mmol/L   Glucose, Bld 118 (H) 65 - 99 mg/dL   BUN 11 6 - 20 mg/dL   Creatinine, Ser 0.91 0.61 - 1.24 mg/dL   Calcium 9.2 8.9 - 10.3 mg/dL   Total Protein 7.1 6.5 - 8.1 g/dL   Albumin 4.0 3.5 - 5.0 g/dL   AST 16 15 - 41 U/L   ALT 18 17 - 63 U/L   Alkaline Phosphatase 66 38 - 126 U/L   Total Bilirubin 1.0 0.3 - 1.2 mg/dL   GFR calc non Af Amer >60 >60 mL/min   GFR calc Af  Amer >60 >60 mL/min    Comment: (NOTE) The eGFR has been calculated using the CKD EPI equation. This calculation has not been validated in all clinical situations. eGFR's persistently <60 mL/min signify possible Chronic Kidney Disease.    Anion gap 12 5 - 15  Urinalysis, Routine w reflex microscopic (not at [facility])     Status: Abnormal   Collection Time: 03/12/15  2:04 AM  Result Value Ref Range   Color, Urine AMBER (A) YELLOW    Comment: BIOCHEMICALS MAY BE AFFECTED BY COLOR   APPearance CLEAR CLEAR   Specific Gravity, Urine 1.027 1.005 - 1.030   pH 7.0 5.0 - 8.0   Glucose, UA NEGATIVE NEGATIVE mg/dL   Hgb urine dipstick NEGATIVE NEGATIVE   Bilirubin Urine NEGATIVE NEGATIVE   Ketones, ur 15 (A) NEGATIVE mg/dL   Protein, ur NEGATIVE NEGATIVE mg/dL   Urobilinogen, UA 1.0 0.0 - 1.0 mg/dL   Nitrite NEGATIVE NEGATIVE   Leukocytes, UA NEGATIVE NEGATIVE    Comment: MICROSCOPIC NOT DONE ON URINES WITH NEGATIVE PROTEIN, BLOOD, LEUKOCYTES, NITRITE, OR GLUCOSE <1000 mg/dL.   Ct Abdomen Pelvis W Contrast  03/12/2015   CLINICAL DATA:  Lower abdominal pain since 5 a.m.  EXAM: CT ABDOMEN AND PELVIS WITH CONTRAST   TECHNIQUE: Multidetector CT imaging of the abdomen and pelvis was performed using the standard protocol following bolus administration of intravenous contrast.  CONTRAST:  159m OMNIPAQUE IOHEXOL 300 MG/ML  SOLN  COMPARISON:  04/28/2009  FINDINGS: There is mural edema throughout the colon, greatest in the left hemicolon. The appearance is consistent with colitis. There is a focal contained perforation of the mid sigmoid colon with a small volume of extraluminal air immediately adjacent to the right lateral margin of the mid sigmoid. There is intense focal inflammatory change in this area and there is a small volume of mesenteric fluid. This is not the appearance of acute diverticulitis. It more likely represents a focal contained perforation complicating a more generalized colitis.  There is no bowel obstruction.  The stomach and small bowel appear unremarkable.  There are normal appearances of the liver, spleen, pancreas, adrenals and kidneys.  Minimal linear atelectatic changes are present in the left base, with otherwise unremarkable appearances of the lower chest.  IMPRESSION: Focal contained perforation at the right lateral aspect of the mid sigmoid colon, superimposed on a more generalized colitis. There is a small volume of mesenteric fluid in the area, but no drainable abscess.   Electronically Signed   By: DAndreas NewportM.D.   On: 03/12/2015 02:58   Dg Chest Port 1 View  03/12/2015   CLINICAL DATA:  Productive cough for 2 weeks.  EXAM: PORTABLE CHEST - 1 VIEW  COMPARISON:  02/23/2013  FINDINGS: There is focal airspace opacity in the lateral left lung which may represent infiltrate or atelectasis. The right lung is clear. There is no large effusion. Pulmonary vasculature is normal.  IMPRESSION: Focal atelectasis or infiltrate in the lateral left lung.   Electronically Signed   By: DAndreas NewportM.D.   On: 03/12/2015 02:17    Review of Systems  Constitutional: Negative.   HENT: Negative.    Eyes: Negative.   Respiratory: Negative for cough and shortness of breath.   Cardiovascular: Negative for chest pain and palpitations.  Gastrointestinal: Positive for abdominal pain and constipation. Negative for nausea and vomiting.  Genitourinary: Negative.   Musculoskeletal: Negative.   Skin: Negative.   Neurological: Negative.   Endo/Heme/Allergies: Negative.   Psychiatric/Behavioral: Negative.  Blood pressure 137/77, pulse 76, temperature 98.3 F (36.8 C), temperature source Oral, resp. rate 18, height '6\' 1"'  (1.854 m), weight 101.152 kg (223 lb), SpO2 96 %. Physical Exam  Constitutional: He is oriented to person, place, and time. He appears well-developed and well-nourished. No distress.  HENT:  Head: Normocephalic and atraumatic.  Right Ear: External ear normal.  Left Ear: External ear normal.  Nose: Nose normal.  Mouth/Throat: Oropharynx is clear and moist. No oropharyngeal exudate.  Eyes: EOM are normal. Pupils are equal, round, and reactive to light. Right eye exhibits no discharge. Left eye exhibits no discharge. No scleral icterus.  Neck: Normal range of motion. Neck supple. No tracheal deviation present.  Cardiovascular: Normal rate, regular rhythm, normal heart sounds and intact distal pulses.   Respiratory: Effort normal and breath sounds normal. No stridor. No respiratory distress. He has no wheezes. He has no rales.  GI: Soft. He exhibits no distension. There is tenderness. There is guarding. There is no rebound.  Hypoactive bowel sounds, tenderness lower midline with intermittent voluntary guarding, no generalized peritonitis  Musculoskeletal: Normal range of motion. He exhibits no edema or tenderness.  Neurological: He is alert and oriented to person, place, and time. He exhibits normal muscle tone.  Skin:  Some acne on face and chest  Psychiatric: He has a normal mood and affect.     Assessment/Plan Sigmoid colitis with small localized perforation - admit,  bowel rest, IV antibiotics. If he does not improve he may need colectomy with colostomy. I discussed this plan with him and his wife. We'll place nicotine patch for tobacco abuse.  Gwendolin Briel E 03/12/2015, 5:03 AM

## 2015-03-12 NOTE — Progress Notes (Signed)
[facility] Progress Note     Subjective: Pain is better controlled, still nauseated.  Threw up while I was in the room.  Requesting patch.  He says its hot in his room, but thermostat turned all the way down.  No questions/concerns  Objective: Vital signs in last 24 hours: Temp:  [98.3 F (36.8 C)-98.6 F (37 C)] 98.6 F (37 C) (06/10 0612) Pulse Rate:  [69-89] 75 (06/10 0612) Resp:  [12-25] 16 (06/10 0612) BP: (123-158)/(53-80) 145/66 mmHg (06/10 0612) SpO2:  [91 %-98 %] 95 % (06/10 0612) Weight:  [101.152 kg (223 lb)-107.593 kg (237 lb 3.2 oz)] 107.593 kg (237 lb 3.2 oz) (06/10 0612) Last BM Date: 03/11/15  Intake/Output from previous day:   Intake/Output this shift:    PE: Gen:  Alert, NAD, pleasant Card:  RRR, no M/G/R heard Pulm:  CTA, no W/R/R Abd: Soft, mildly distended, tender in suprapubic and RLQ, +BS, no HSM   Lab Results:   Recent Labs  03/12/15 0155  WBC 17.9*  HGB 16.3  HCT 46.1  PLT 225   BMET  Recent Labs  03/12/15 0155  NA 136  K 3.7  CL 104  CO2 20*  GLUCOSE 118*  BUN 11  CREATININE 0.91  CALCIUM 9.2   PT/INR No results for input(s): LABPROT, INR in the last 72 hours. CMP     Component Value Date/Time   NA 136 03/12/2015 0155   NA 141 02/23/2013 2133   K 3.7 03/12/2015 0155   K 3.5 02/23/2013 2133   CL 104 03/12/2015 0155   CL 109* 02/23/2013 2133   CO2 20* 03/12/2015 0155   CO2 29 02/23/2013 2133   GLUCOSE 118* 03/12/2015 0155   GLUCOSE 75 02/23/2013 2133   BUN 11 03/12/2015 0155   BUN 11 02/23/2013 2133   CREATININE 0.91 03/12/2015 0155   CREATININE 0.96 02/23/2013 2133   CALCIUM 9.2 03/12/2015 0155   CALCIUM 8.6 02/23/2013 2133   PROT 7.1 03/12/2015 0155   PROT 7.0 02/23/2013 2133   ALBUMIN 4.0 03/12/2015 0155   ALBUMIN 3.7 02/23/2013 2133   AST 16 03/12/2015 0155   AST 21 02/23/2013 2133   ALT 18 03/12/2015 0155   ALT 21 02/23/2013 2133   ALKPHOS 66 03/12/2015 0155   ALKPHOS 61 02/23/2013 2133   BILITOT 1.0 03/12/2015 0155   GFRNONAA >60 03/12/2015 0155   GFRNONAA >60 02/23/2013 2133   GFRAA >60 03/12/2015 0155   GFRAA >60 02/23/2013 2133   Lipase     Component Value Date/Time   LIPASE 23 04/27/2009 2050       Studies/Results: Ct Abdomen Pelvis W Contrast  03/12/2015   CLINICAL DATA:  Lower abdominal pain since 5 a.m.  EXAM: CT ABDOMEN AND PELVIS WITH CONTRAST  TECHNIQUE: Multidetector CT imaging of the abdomen and pelvis was performed using the standard protocol following bolus administration of intravenous contrast.  CONTRAST:  145mL OMNIPAQUE IOHEXOL 300 MG/ML  SOLN  COMPARISON:  04/28/2009  FINDINGS: There is mural edema throughout the colon, greatest in the left hemicolon. The appearance is consistent with colitis. There is a focal contained perforation of the mid sigmoid colon with a small volume of extraluminal air immediately adjacent to the right lateral margin of the mid sigmoid. There is intense focal inflammatory change in this area and there is a small volume of mesenteric fluid. This is not the appearance of acute diverticulitis. It more likely represents a focal contained perforation complicating a more generalized colitis.  There is no bowel obstruction.  The stomach and small bowel appear unremarkable.  There are normal appearances of the liver, spleen, pancreas, adrenals and kidneys.  Minimal linear atelectatic changes are present in the left base, with otherwise unremarkable appearances of the lower chest.  IMPRESSION: Focal contained perforation at the right lateral aspect of the mid sigmoid colon, superimposed on a more generalized colitis. There is a small volume of mesenteric fluid in the area, but no drainable abscess.   Electronically Signed   By: Andreas Newport M.D.   On: 03/12/2015 02:58   Dg Chest Port 1 View  03/12/2015   CLINICAL DATA:  Productive cough for 2 weeks.  EXAM: PORTABLE CHEST - 1 VIEW  COMPARISON:  02/23/2013  FINDINGS: There is focal airspace  opacity in the lateral left lung which may represent infiltrate or atelectasis. The right lung is clear. There is no large effusion. Pulmonary vasculature is normal.  IMPRESSION: Focal atelectasis or infiltrate in the lateral left lung.   Electronically Signed   By: Andreas Newport M.D.   On: 03/12/2015 02:17    Anti-infectives: Anti-infectives    Start     Dose/Rate Route Frequency Ordered Stop   03/12/15 1200  piperacillin-tazobactam (ZOSYN) IVPB 3.375 g     3.375 g 12.5 mL/hr over 240 Minutes Intravenous 3 times per day 03/12/15 0619     03/12/15 0330  piperacillin-tazobactam (ZOSYN) IVPB 3.375 g     3.375 g 100 mL/hr over 30 Minutes Intravenous  Once 03/12/15 0328 03/12/15 0506   03/12/15 0330  metroNIDAZOLE (FLAGYL) IVPB 500 mg     500 mg 100 mL/hr over 60 Minutes Intravenous  Once 03/12/15 0328 03/12/15 0506       Assessment/Plan Sigmoid colitis with small localized perforation (?divertic vs colitis) -NPO, bowel rest, IV antibiotics, IVF, pain control, antiemetics -If he does not improve he may need colectomy with colostomy -Ambulate and IS -SCD's and lovenox  -On zosyn Day #1 -Ice/fan & tylenol ordered  Tobacco abuse -nicotine patch   LOS: 0 days    Nat Christen 03/12/2015, 7:24 AM Pager: [phone]

## 2015-03-13 LAB — BASIC METABOLIC PANEL
ANION GAP: 7 (ref 5–15)
BUN: 7 mg/dL (ref 6–20)
CHLORIDE: 101 mmol/L (ref 101–111)
CO2: 27 mmol/L (ref 22–32)
Calcium: 8.8 mg/dL — ABNORMAL LOW (ref 8.9–10.3)
Creatinine, Ser: 0.91 mg/dL (ref 0.61–1.24)
GFR calc Af Amer: >60 mL/min (ref >60)
GLUCOSE: 112 mg/dL — AB (ref 65–99)
Potassium: 3.9 mmol/L (ref 3.5–5.1)
Sodium: 135 mmol/L (ref 135–145)

## 2015-03-13 LAB — CBC
HEMATOCRIT: 42.1 % (ref 39.0–52.0)
HEMOGLOBIN: 14.3 g/dL (ref 13.0–17.0)
MCH: 29.5 pg (ref 26.0–34.0)
MCHC: 34 g/dL (ref 30.0–36.0)
MCV: 87 fL (ref 78.0–100.0)
Platelets: 178 10*3/uL (ref 150–400)
RBC: 4.84 MIL/uL (ref 4.22–5.81)
RDW: 13.3 % (ref 11.5–15.5)
WBC: 14.2 10*3/uL — ABNORMAL HIGH (ref 4.0–10.5)

## 2015-03-13 NOTE — Progress Notes (Signed)
Subjective: Feels better. Less pain. Did have episode of emesis yesterday. Burping. Not much flatus. Had BM. +nausea; has HA  Objective: Vital signs in last 24 hours: Temp:  [98.4 F (36.9 C)-99.1 F (37.3 C)] 98.8 F (37.1 C) (06/11 0556) Pulse Rate:  [62-82] 71 (06/11 0556) Resp:  [16] 16 (06/11 0556) BP: (126-145)/(56-68) 134/64 mmHg (06/11 0556) SpO2:  [93 %-99 %] 93 % (06/11 0556) Last BM Date: 03/11/15  Intake/Output from previous day: 06/10 0701 - 06/11 0700 In: 110 [P.O.:110] Out: 1254 [Urine:1253; Emesis/NG output:1] Intake/Output this shift:    Looks much more comfortable today cta b/l Reg Soft, mild distension, LLQ TTP - somewhat better; no peritonitis  Lab Results:   Recent Labs  03/12/15 0155 03/13/15 0332  WBC 17.9* 14.2*  HGB 16.3 14.3  HCT 46.1 42.1  PLT 225 178   BMET  Recent Labs  03/12/15 0155 03/13/15 0332  NA 136 135  K 3.7 3.9  CL 104 101  CO2 20* 27  GLUCOSE 118* 112*  BUN 11 7  CREATININE 0.91 0.91  CALCIUM 9.2 8.8*   PT/INR No results for input(s): LABPROT, INR in the last 72 hours. ABG No results for input(s): PHART, HCO3 in the last 72 hours.  Invalid input(s): PCO2, PO2  Studies/Results: Ct Abdomen Pelvis W Contrast  03/12/2015   CLINICAL DATA:  Lower abdominal pain since 5 a.m.  EXAM: CT ABDOMEN AND PELVIS WITH CONTRAST  TECHNIQUE: Multidetector CT imaging of the abdomen and pelvis was performed using the standard protocol following bolus administration of intravenous contrast.  CONTRAST:  176mL OMNIPAQUE IOHEXOL 300 MG/ML  SOLN  COMPARISON:  04/28/2009  FINDINGS: There is mural edema throughout the colon, greatest in the left hemicolon. The appearance is consistent with colitis. There is a focal contained perforation of the mid sigmoid colon with a small volume of extraluminal air immediately adjacent to the right lateral margin of the mid sigmoid. There is intense focal inflammatory change in this area and there is a  small volume of mesenteric fluid. This is not the appearance of acute diverticulitis. It more likely represents a focal contained perforation complicating a more generalized colitis.  There is no bowel obstruction.  The stomach and small bowel appear unremarkable.  There are normal appearances of the liver, spleen, pancreas, adrenals and kidneys.  Minimal linear atelectatic changes are present in the left base, with otherwise unremarkable appearances of the lower chest.  IMPRESSION: Focal contained perforation at the right lateral aspect of the mid sigmoid colon, superimposed on a more generalized colitis. There is a small volume of mesenteric fluid in the area, but no drainable abscess.   Electronically Signed   By: Andreas Newport M.D.   On: 03/12/2015 02:58   Dg Chest Port 1 View  03/12/2015   CLINICAL DATA:  Productive cough for 2 weeks.  EXAM: PORTABLE CHEST - 1 VIEW  COMPARISON:  02/23/2013  FINDINGS: There is focal airspace opacity in the lateral left lung which may represent infiltrate or atelectasis. The right lung is clear. There is no large effusion. Pulmonary vasculature is normal.  IMPRESSION: Focal atelectasis or infiltrate in the lateral left lung.   Electronically Signed   By: Andreas Newport M.D.   On: 03/12/2015 02:17    Anti-infectives: Anti-infectives    Start     Dose/Rate Route Frequency Ordered Stop   03/12/15 1200  piperacillin-tazobactam (ZOSYN) IVPB 3.375 g     3.375 g 12.5 mL/hr over 240 Minutes Intravenous 3  times per day 03/12/15 0619     03/12/15 0330  piperacillin-tazobactam (ZOSYN) IVPB 3.375 g     3.375 g 100 mL/hr over 30 Minutes Intravenous  Once 03/12/15 0328 03/12/15 0506   03/12/15 0330  metroNIDAZOLE (FLAGYL) IVPB 500 mg     500 mg 100 mL/hr over 60 Minutes Intravenous  Once 03/12/15 0328 03/12/15 0506      Assessment/Plan: Sigmoid colitis with small localized perforation (?divertic vs colitis) No fever, no tachycardia, wbc down, looks better.  Will  continue with medical mgmt.  Cont IV abx Will let him have some SIPS of clears from the floor Repeat CBC in am  Headache - probable caffeine related. Will give dose of tylenol Cont VTE prophylaxis  [name]. Redmond Pulling, MD, FACS General, Bariatric, & Minimally Invasive Surgery [facility], [place]   LOS: 1 day    Eric Peck 03/13/2015

## 2015-03-14 LAB — CBC
HCT: 42.8 % (ref 39.0–52.0)
Hemoglobin: 14.7 g/dL (ref 13.0–17.0)
MCH: 29.9 pg (ref 26.0–34.0)
MCHC: 34.3 g/dL (ref 30.0–36.0)
MCV: 87 fL (ref 78.0–100.0)
Platelets: 192 10*3/uL (ref 150–400)
RBC: 4.92 MIL/uL (ref 4.22–5.81)
RDW: 13.1 % (ref 11.5–15.5)
WBC: 10.5 10*3/uL (ref 4.0–10.5)

## 2015-03-14 MED ORDER — PROMETHAZINE HCL 25 MG/ML IJ SOLN
12.5000 mg | Freq: Four times a day (QID) | INTRAMUSCULAR | Status: DC | PRN
  Administered 2015-03-14 – 2015-03-15 (×3): 12.5 mg via INTRAVENOUS
  Filled 2015-03-14 (×3): qty 1

## 2015-03-14 NOTE — Progress Notes (Signed)
  Subjective: Mild nausea; lots of flatus; +BM. Pain better; reports emesis of phlegm  Objective: Vital signs in last 24 hours: Temp:  [98.4 F (36.9 C)-98.7 F (37.1 C)] 98.4 F (36.9 C) (06/12 0657) Pulse Rate:  [62-77] 65 (06/12 0657) Resp:  [16-19] 18 (06/12 0657) BP: (130-141)/(64-96) 140/96 mmHg (06/12 0657) SpO2:  [95 %-98 %] 97 % (06/12 0657) Last BM Date: 03/13/15  Intake/Output from previous day: 06/11 0701 - 06/12 0700 In: 120 [P.O.:120] Out: 800 [Urine:800] Intake/Output this shift:    Nontoxic, not ill appearing cta b/l Reg Soft, nd, LLQ TTP - improved. Not really guarding anymore; no peritonitis  Lab Results:   Recent Labs  03/13/15 0332 03/14/15 0350  WBC 14.2* 10.5  HGB 14.3 14.7  HCT 42.1 42.8  PLT 178 192   BMET  Recent Labs  03/12/15 0155 03/13/15 0332  NA 136 135  K 3.7 3.9  CL 104 101  CO2 20* 27  GLUCOSE 118* 112*  BUN 11 7  CREATININE 0.91 0.91  CALCIUM 9.2 8.8*   PT/INR No results for input(s): LABPROT, INR in the last 72 hours. ABG No results for input(s): PHART, HCO3 in the last 72 hours.  Invalid input(s): PCO2, PO2  Studies/Results: No results found.  Anti-infectives: Anti-infectives    Start     Dose/Rate Route Frequency Ordered Stop   03/12/15 1200  piperacillin-tazobactam (ZOSYN) IVPB 3.375 g     3.375 g 12.5 mL/hr over 240 Minutes Intravenous 3 times per day 03/12/15 0619     03/12/15 0330  piperacillin-tazobactam (ZOSYN) IVPB 3.375 g     3.375 g 100 mL/hr over 30 Minutes Intravenous  Once 03/12/15 0328 03/12/15 0506   03/12/15 0330  metroNIDAZOLE (FLAGYL) IVPB 500 mg     500 mg 100 mL/hr over 60 Minutes Intravenous  Once 03/12/15 0328 03/12/15 0506      Assessment/Plan: Sigmoid colitis with small localized perforation (?divertic vs colitis) Continues to improve. No fever. No tachycardia. Wbc now normal. Subjectively feels better.  Will start clear liquid diet today Cont VTE prophylaxis Cont IV abx    [name]. Redmond Pulling, MD, FACS General, Bariatric, & Minimally Invasive Surgery [facility], [place]   LOS: 2 days    Gayland Curry 03/14/2015

## 2015-03-15 ENCOUNTER — Inpatient Hospital Stay (HOSPITAL_COMMUNITY): Payer: Medicaid Other

## 2015-03-15 ENCOUNTER — Encounter (HOSPITAL_COMMUNITY): Payer: Self-pay | Admitting: Radiology

## 2015-03-15 LAB — CBC
HCT: 42.2 % (ref 39.0–52.0)
HEMOGLOBIN: 14 g/dL (ref 13.0–17.0)
MCH: 28.8 pg (ref 26.0–34.0)
MCHC: 33.2 g/dL (ref 30.0–36.0)
MCV: 86.8 fL (ref 78.0–100.0)
Platelets: 205 10*3/uL (ref 150–400)
RBC: 4.86 MIL/uL (ref 4.22–5.81)
RDW: 13.1 % (ref 11.5–15.5)
WBC: 8.8 10*3/uL (ref 4.0–10.5)

## 2015-03-15 IMAGING — CT CT ABD-PELV W/ CM
2 of 5 series · 9 of 46 positions shown, 10 images · IV contrast (omnipaque)
Comparison: [DATE]

CLINICAL DATA: Postprandial abdominal pain, nausea

EXAM:
CT ABDOMEN AND PELVIS WITH CONTRAST
TECHNIQUE: Multidetector CT imaging of the abdomen and pelvis was performed
using the standard protocol following bolus administration of
intravenous contrast.
CONTRAST:  100mL OMNIPAQUE IOHEXOL 300 MG/ML  SOLN

[Series 201: routine, idose (2) · axial · 0.85mm/px · z∈[+66,+446]mm · 6 of 100 slices shown, 7 images]
[im 12/100  soft-tissue]
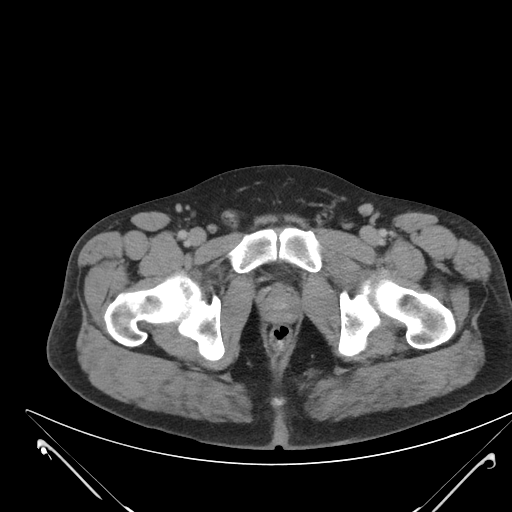
[im 12/100  bone]
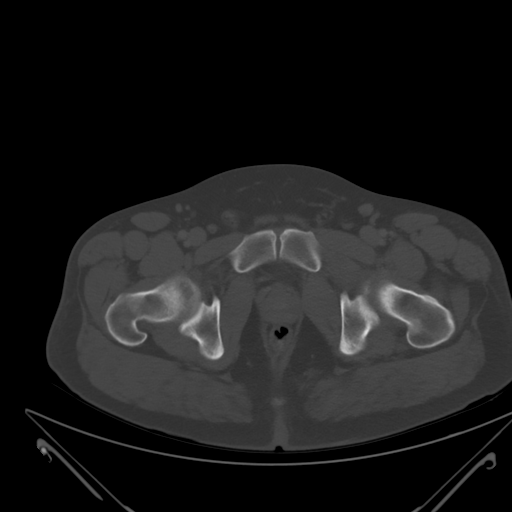
[im 30/100  soft-tissue]
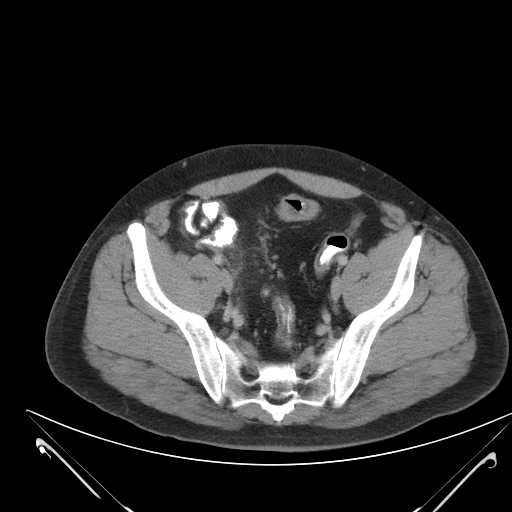
[im 41/100  soft-tissue]
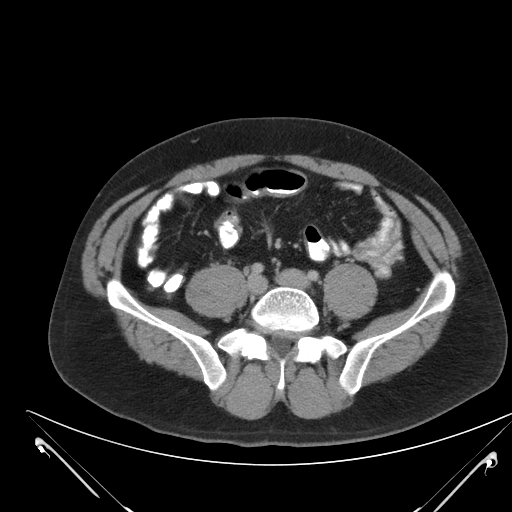
[im 59/100  soft-tissue]
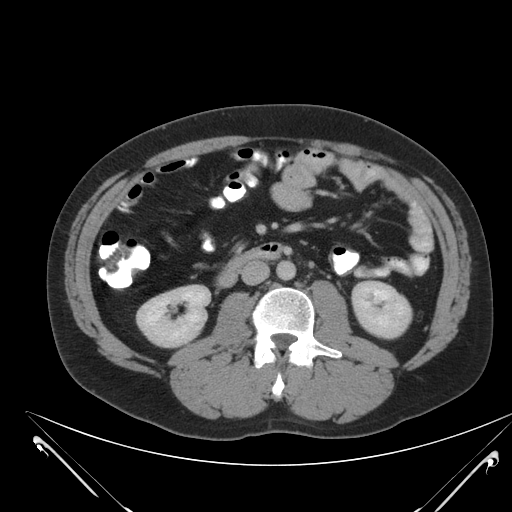
[im 76/100  soft-tissue]
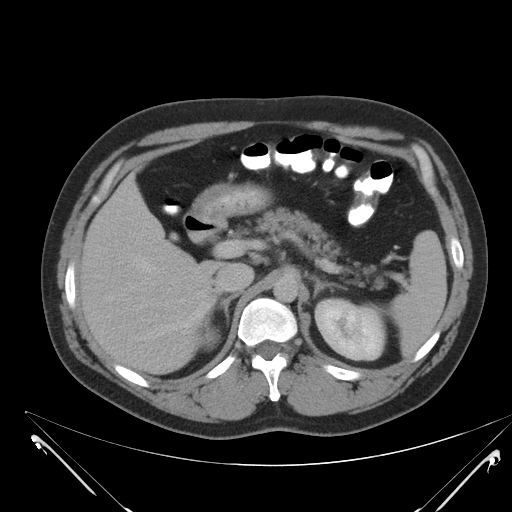
[im 88/100  soft-tissue]
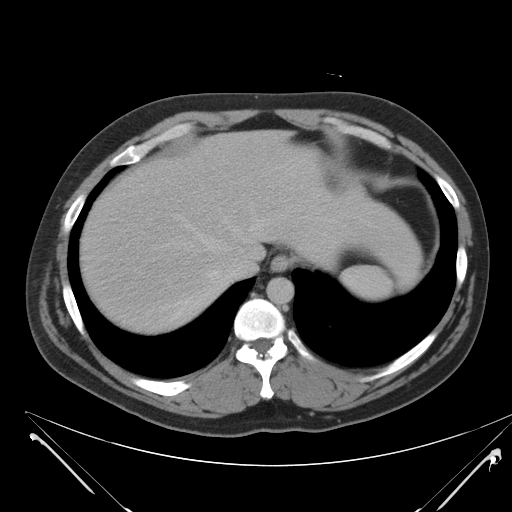

[Series 203: coronals, idose (2) · coronal · 0.45mm/px · 3 of 141 slices shown]
[im 47/141  soft-tissue]
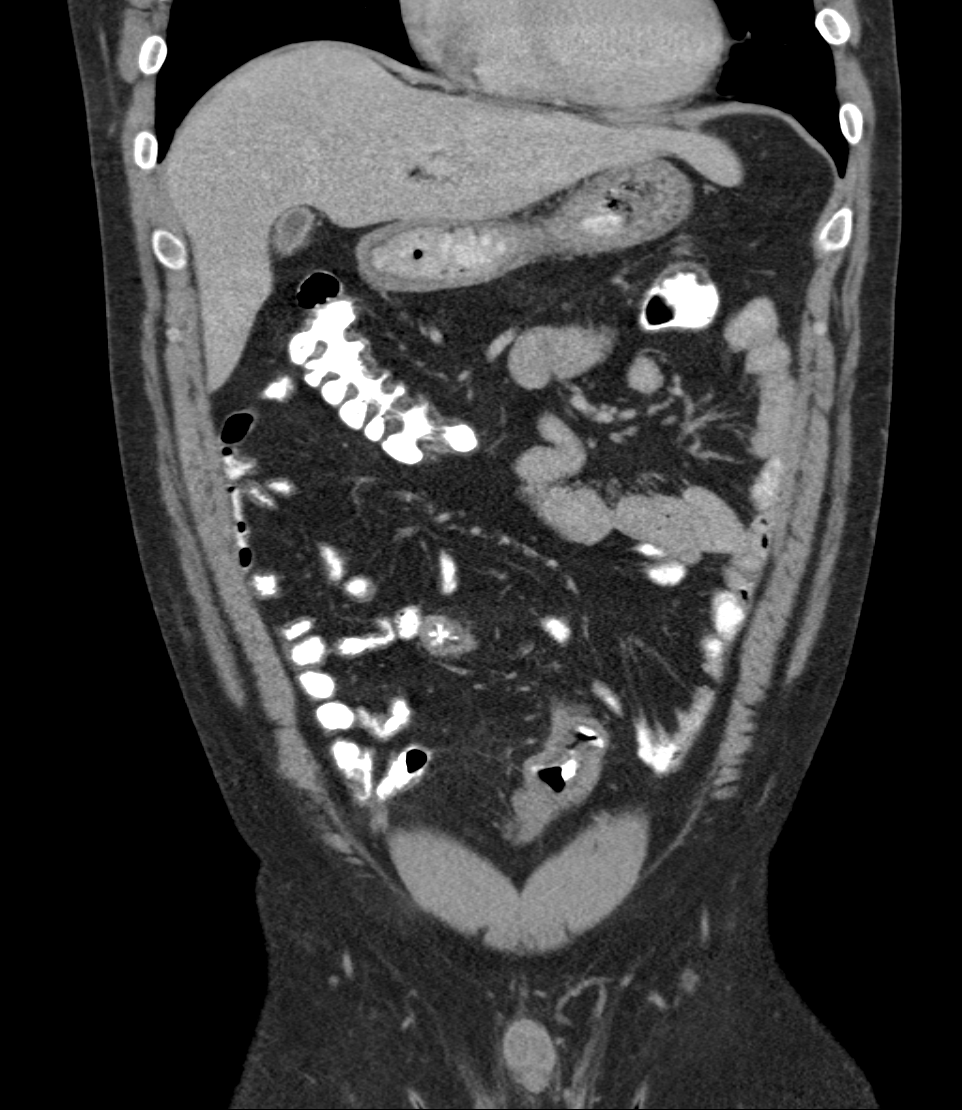
[im 63/141  soft-tissue]
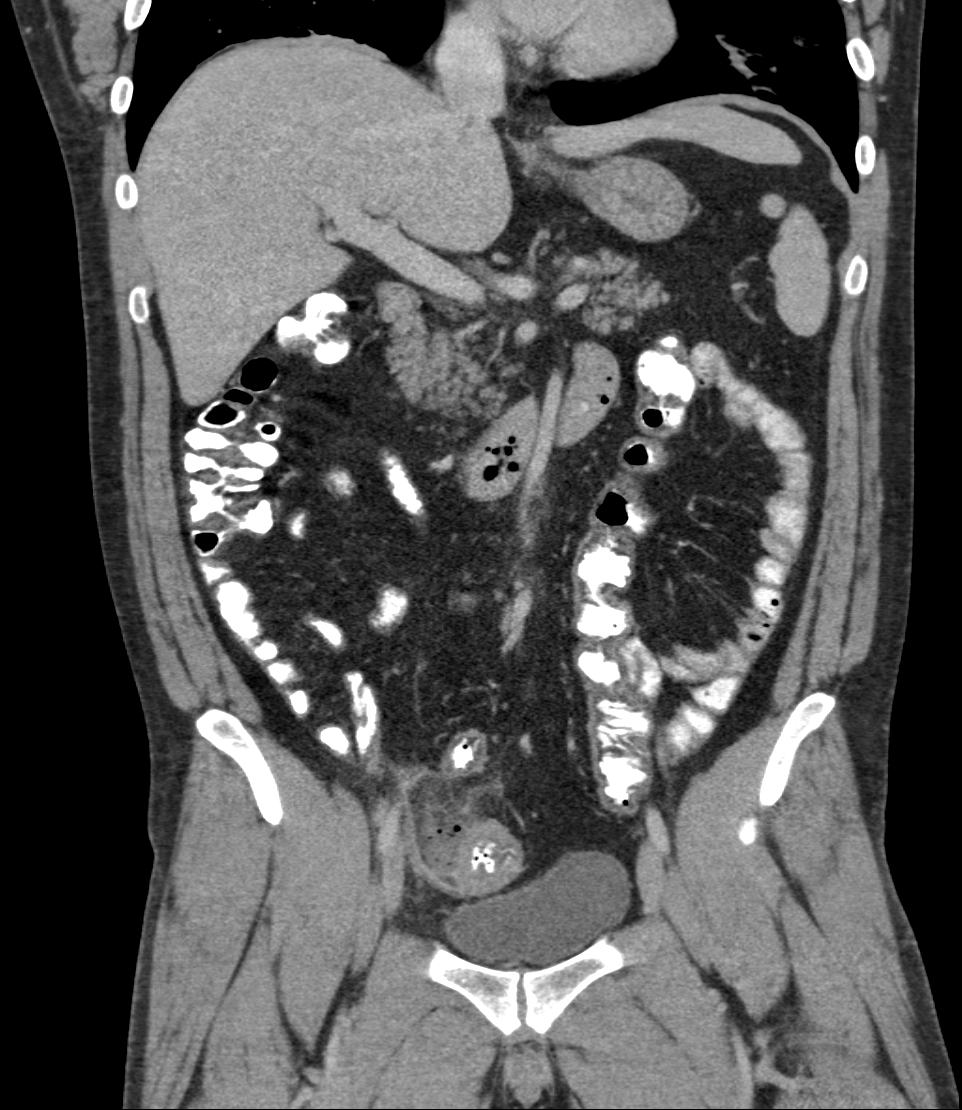
[im 78/141  soft-tissue]
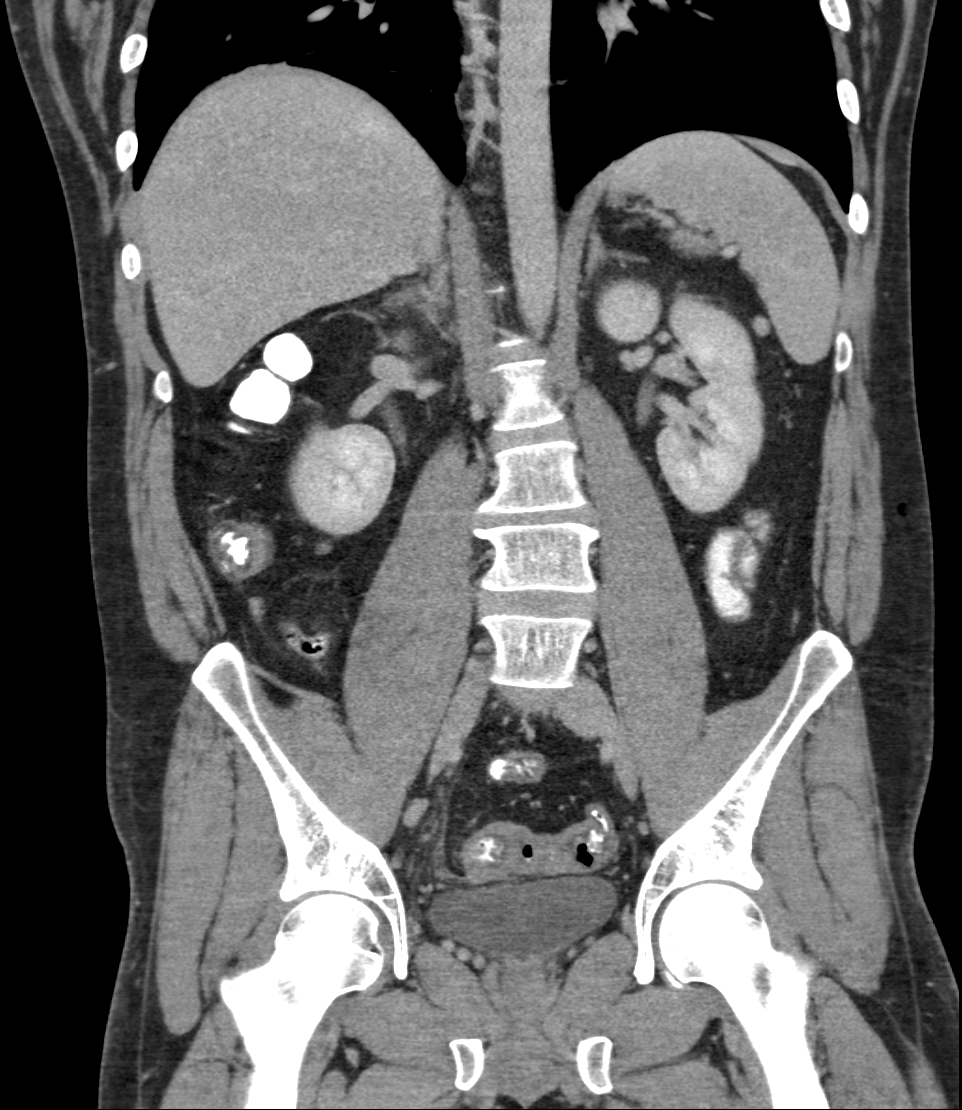

[9 of 46 positions shown; findings below may reference images not displayed]

FINDINGS: Lower chest:  Mild patchy opacity/atelectasis at the left lung base.

Hepatobiliary: Liver is within normal limits. No
suspicious/enhancing hepatic lesions.

Gallbladder is unremarkable. No intrahepatic or extrahepatic ductal
dilatation.

Pancreas: Within normal limits.

Spleen: Within normal limits. Small splenules in the left upper
abdomen.

Adrenals/Urinary Tract: Adrenal glands are within normal limits.

Kidneys are within normal limits.  No hydronephrosis.

Bladder is within normal limits.

Stomach/Bowel: Stomach is within normal limits.

No evidence of bowel obstruction.

Normal appendix.

Sigmoid wall thickening, reflecting sigmoid colitis versus
diverticulitis, with associated localized perforation with scattered
foci of gas in the sigmoid mesocolon (series 21/ image 75),
measuring approximately 1.4 x 1.8 x 2.4 cm in aggregate.

No associated drainable fluid collection/abscess or well-defined rim
to suggest a single contain collection. No free air.

Vascular/Lymphatic: No evidence of abdominal aortic aneurysm.

No suspicious abdominopelvic lymphadenopathy.

Reproductive: Prostate is unremarkable.

Other: No abdominopelvic ascites.

Musculoskeletal: Mild degenerative changes of the lower lumbar
spine.
IMPRESSION: Sigmoid colitis versus diverticulitis.

Associated localized perforation with scattered foci of gas in the
sigmoid mesocolon, as described above.

No drainable fluid collection/abscess.  No free air.

## 2015-03-15 IMAGING — CR DG ABD PORTABLE 2V
2 series · 2 of 2 positions shown · non-contrast
Comparison: CT abdomen pelvis [DATE].

CLINICAL DATA: Right lower abdominal pain for 3 days.  Fever.

EXAM:
PORTABLE ABDOMEN - 2 VIEW

[AP]
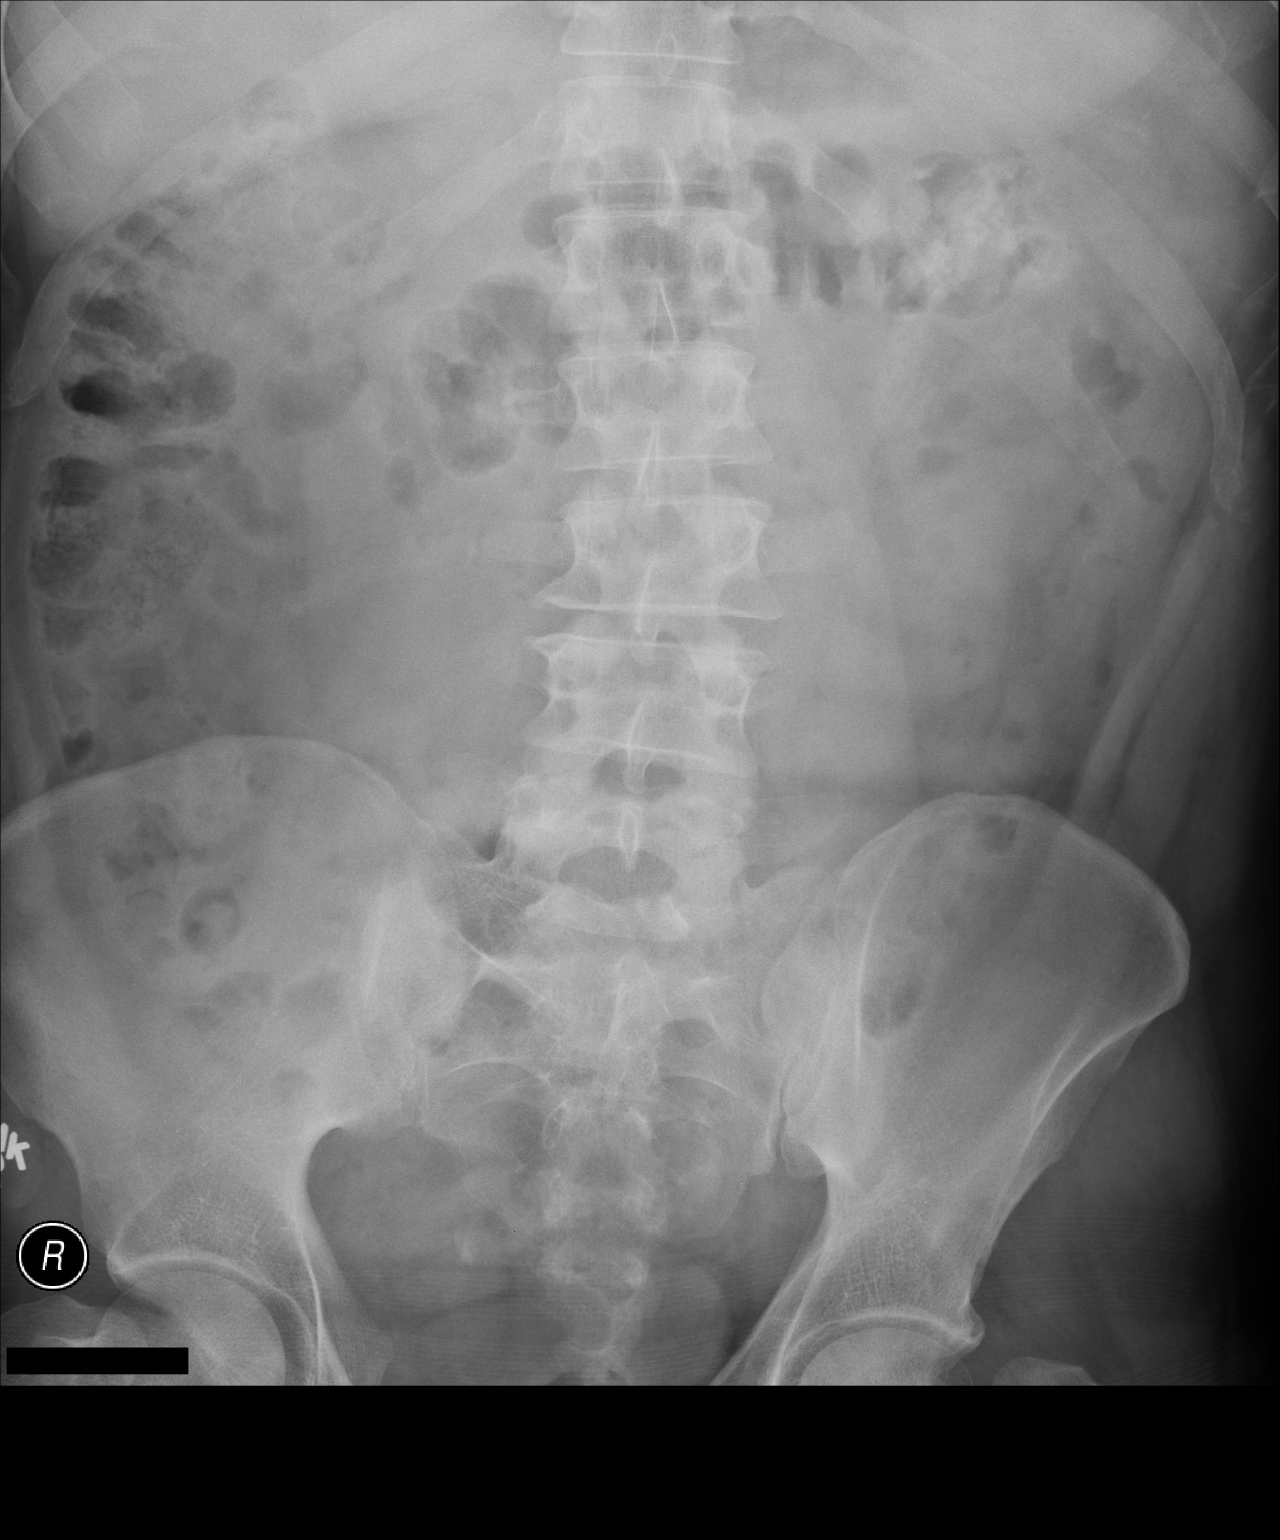

[pa lld]
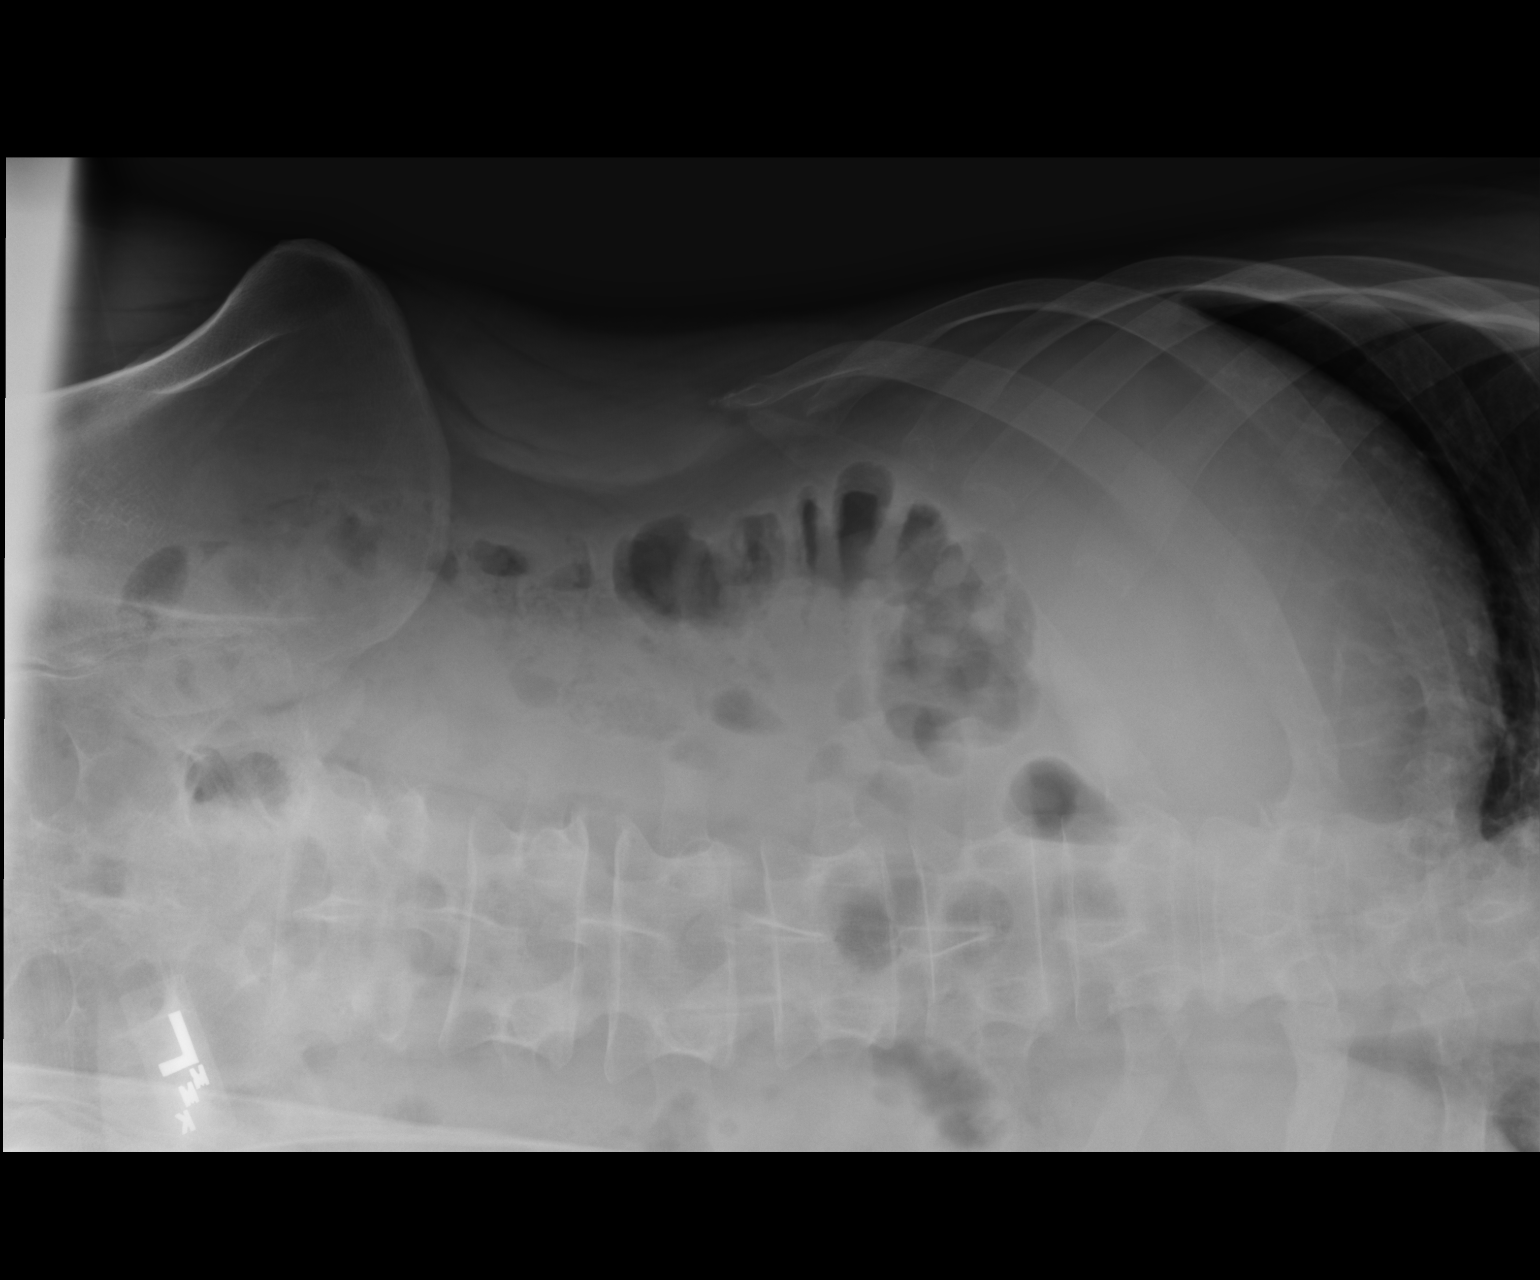

[2 of 2 positions shown; findings below may reference images not displayed]

FINDINGS: Stool is seen in the cecum and ascending colon. No small bowel
dilatation. No unexpected radiopaque calculi. No free air.
IMPRESSION: Stool in the cecum and ascending colon.  No acute findings.

## 2015-03-15 MED ORDER — PIPERACILLIN-TAZOBACTAM 3.375 G IVPB
3.3750 g | Freq: Three times a day (TID) | INTRAVENOUS | Status: DC
  Administered 2015-03-15 – 2015-03-17 (×6): 3.375 g via INTRAVENOUS
  Filled 2015-03-15 (×9): qty 50

## 2015-03-15 MED ORDER — PANTOPRAZOLE SODIUM 40 MG PO TBEC
40.0000 mg | DELAYED_RELEASE_TABLET | Freq: Every day | ORAL | Status: DC
  Administered 2015-03-15: 40 mg via ORAL
  Filled 2015-03-15: qty 1

## 2015-03-15 MED ORDER — ONDANSETRON HCL 4 MG PO TABS: 4.0000 mg | ORAL_TABLET | Freq: Four times a day (QID) | ORAL | Status: DC | PRN

## 2015-03-15 MED ORDER — PROMETHAZINE HCL 25 MG PO TABS
12.5000 mg | ORAL_TABLET | Freq: Four times a day (QID) | ORAL | Status: DC | PRN
Start: 2015-03-15 — End: 2015-03-17

## 2015-03-15 MED ORDER — AMOXICILLIN-POT CLAVULANATE 875-125 MG PO TABS
1.0000 | ORAL_TABLET | Freq: Two times a day (BID) | ORAL | Status: DC
  Administered 2015-03-15: 1 via ORAL
  Filled 2015-03-15: qty 1

## 2015-03-15 MED ORDER — HYDROCODONE-ACETAMINOPHEN 5-325 MG PO TABS
1.0000 | ORAL_TABLET | ORAL | Status: DC | PRN
  Administered 2015-03-15 (×2): 2 via ORAL
  Administered 2015-03-16: 1 via ORAL
  Filled 2015-03-15 (×3): qty 2

## 2015-03-15 MED ORDER — HYDROMORPHONE HCL 1 MG/ML IJ SOLN
1.0000 mg | Freq: Once | INTRAMUSCULAR | Status: AC
  Administered 2015-03-15: 1 mg via INTRAVENOUS
  Filled 2015-03-15: qty 1

## 2015-03-15 MED ORDER — ONDANSETRON HCL 4 MG/2ML IJ SOLN
4.0000 mg | Freq: Four times a day (QID) | INTRAMUSCULAR | Status: DC | PRN
  Administered 2015-03-15: 4 mg via INTRAVENOUS
  Filled 2015-03-15: qty 2

## 2015-03-15 MED ORDER — IOHEXOL 300 MG/ML  SOLN
100.0000 mL | Freq: Once | INTRAMUSCULAR | Status: AC | PRN
  Administered 2015-03-15: 100 mL via INTRAVENOUS

## 2015-03-15 MED ORDER — HYDROMORPHONE HCL 1 MG/ML IJ SOLN
1.0000 mg | INTRAMUSCULAR | Status: DC | PRN
  Administered 2015-03-15 (×3): 1 mg via INTRAVENOUS
  Filled 2015-03-15 (×2): qty 1

## 2015-03-15 NOTE — Progress Notes (Signed)
Patient ID: Eric Peck, male   DOB: 1975-10-26, 39 y.o.   MRN: [id number]  S: complains of worsening pain after eating his lunch at about 1PM.  Associated with chills, sweats and nausea.  He took about 15mg  of dilaudid yesterday.  Had norco today.  Was doing great this morning.  Changed atbx to Augmentin.  He has lost IV access. O: appears in distress.  s1s2 rrr, no murmurs, gallops or rubs.  No tachycardia.+bs, abdomen is soft, tender throughout, more prominent to RLQ.  Voluntary guarding. A/P: stat AXR to r/o free air.  Then a CT of A/P.  NPO, increase, IVF.  Add IV pain meds.  Change back to zosyn.  Layson Bertsch, ANP-BC

## 2015-03-15 NOTE — Progress Notes (Signed)
[facility] Progress Note     Subjective: Pt feels good, no pain or N/V.  Says he is having "gas pains" in the upper abdomen and lots of flatus.  He says he's only needed pain meds for that.  He denies a BM since 03/13/15.  He wants to eat more solid food.  Ambulating well.  Says he's smoking outside and using e-sigs since patch made him nauseated.  Objective: Vital signs in last 24 hours: Temp:  [98.7 F (37.1 C)-98.8 F (37.1 C)] 98.7 F (37.1 C) (06/13 0601) Pulse Rate:  [61-68] 61 (06/13 0601) Resp:  [16-18] 16 (06/13 0601) BP: (117-138)/(65-90) 117/65 mmHg (06/13 0601) SpO2:  [93 %-98 %] 93 % (06/13 0601) Last BM Date: 03/13/15  Intake/Output from previous day: 06/12 0701 - 06/13 0700 In: 1820 [P.O.:720; I.V.:1000; IV Piggyback:100] Out: 900 [Urine:900] Intake/Output this shift:    PE: Gen:  Alert, NAD, pleasant Abd: Soft, NT, mild distension, +BS, no HSM   Lab Results:   Recent Labs  03/14/15 0350 03/15/15 0428  WBC 10.5 8.8  HGB 14.7 14.0  HCT 42.8 42.2  PLT 192 205   BMET  Recent Labs  03/13/15 0332  NA 135  K 3.9  CL 101  CO2 27  GLUCOSE 112*  BUN 7  CREATININE 0.91  CALCIUM 8.8*   PT/INR No results for input(s): LABPROT, INR in the last 72 hours. CMP     Component Value Date/Time   NA 135 03/13/2015 0332   NA 141 02/23/2013 2133   K 3.9 03/13/2015 0332   K 3.5 02/23/2013 2133   CL 101 03/13/2015 0332   CL 109* 02/23/2013 2133   CO2 27 03/13/2015 0332   CO2 29 02/23/2013 2133   GLUCOSE 112* 03/13/2015 0332   GLUCOSE 75 02/23/2013 2133   BUN 7 03/13/2015 0332   BUN 11 02/23/2013 2133   CREATININE 0.91 03/13/2015 0332   CREATININE 0.96 02/23/2013 2133   CALCIUM 8.8* 03/13/2015 0332   CALCIUM 8.6 02/23/2013 2133   PROT 7.1 03/12/2015 0155   PROT 7.0 02/23/2013 2133   ALBUMIN 4.0 03/12/2015 0155   ALBUMIN 3.7 02/23/2013 2133   AST 16 03/12/2015 0155   AST 21 02/23/2013 2133   ALT 18 03/12/2015 0155   ALT 21  02/23/2013 2133   ALKPHOS 66 03/12/2015 0155   ALKPHOS 61 02/23/2013 2133   BILITOT 1.0 03/12/2015 0155   GFRNONAA >60 03/13/2015 0332   GFRNONAA >60 02/23/2013 2133   GFRAA >60 03/13/2015 0332   GFRAA >60 02/23/2013 2133   Lipase     Component Value Date/Time   LIPASE 23 04/27/2009 2050       Studies/Results: No results found.  Anti-infectives: Anti-infectives    Start     Dose/Rate Route Frequency Ordered Stop   03/12/15 1200  piperacillin-tazobactam (ZOSYN) IVPB 3.375 g     3.375 g 12.5 mL/hr over 240 Minutes Intravenous 3 times per day 03/12/15 0619     03/12/15 0330  piperacillin-tazobactam (ZOSYN) IVPB 3.375 g     3.375 g 100 mL/hr over 30 Minutes Intravenous  Once 03/12/15 0328 03/12/15 0506   03/12/15 0330  metroNIDAZOLE (FLAGYL) IVPB 500 mg     500 mg 100 mL/hr over 60 Minutes Intravenous  Once 03/12/15 0328 03/12/15 0506       Assessment/Plan Sigmoid colitis with small localized perforation (?divertic vs colitis) -Continues to improve. No fever. No tachycardia. Wbc now normal. Subjectively feels better.  -Advance to fulls, soft  at dinner if tolerating -IV antibiotics, red. IVF, orals for pain control -Seems to be improving, so he may not require colectomy with colostomy this admission -Ambulate and IS -SCD's and lovenox  -On zosyn Day #4 -Dietitian for low residue diet -D/c home tomorrow to follow up with Dr. Redmond Pulling in 2-3 weeks (will arrange), 1 week abx at discharge, will need CSP in 6-8 weeks  Tobacco abuse -nicotine patch gave him nausea -he's using e-sigs and sigs outside    LOS: 3 days    Eric Peck 03/15/2015, 7:52 AM Pager: [phone]

## 2015-03-16 LAB — BASIC METABOLIC PANEL
Anion gap: 8 (ref 5–15)
BUN: 8 mg/dL (ref 6–20)
CO2: 29 mmol/L (ref 22–32)
Calcium: 9.4 mg/dL (ref 8.9–10.3)
Chloride: 104 mmol/L (ref 101–111)
Creatinine, Ser: 1.03 mg/dL (ref 0.61–1.24)
Glucose, Bld: 92 mg/dL (ref 65–99)
Potassium: 3.9 mmol/L (ref 3.5–5.1)
Sodium: 141 mmol/L (ref 135–145)

## 2015-03-16 LAB — CBC
HEMATOCRIT: 43.2 % (ref 39.0–52.0)
HEMOGLOBIN: 14.7 g/dL (ref 13.0–17.0)
MCH: 29.2 pg (ref 26.0–34.0)
MCHC: 34 g/dL (ref 30.0–36.0)
MCV: 85.7 fL (ref 78.0–100.0)
Platelets: 231 10*3/uL (ref 150–400)
RBC: 5.04 MIL/uL (ref 4.22–5.81)
RDW: 13 % (ref 11.5–15.5)
WBC: 7.5 10*3/uL (ref 4.0–10.5)

## 2015-03-16 MED ORDER — BISACODYL 10 MG RE SUPP
10.0000 mg | Freq: Once | RECTAL | Status: AC
  Administered 2015-03-16: 10 mg via RECTAL
  Filled 2015-03-16: qty 1

## 2015-03-16 MED ORDER — POLYETHYLENE GLYCOL 3350 17 G PO PACK
17.0000 g | PACK | Freq: Every day | ORAL | Status: DC
  Administered 2015-03-16: 17 g via ORAL
  Filled 2015-03-16: qty 1

## 2015-03-16 MED ORDER — DOCUSATE SODIUM 100 MG PO CAPS
100.0000 mg | ORAL_CAPSULE | Freq: Two times a day (BID) | ORAL | Status: DC
  Administered 2015-03-16 (×2): 100 mg via ORAL
  Filled 2015-03-16 (×2): qty 1

## 2015-03-16 NOTE — Progress Notes (Signed)
[facility] Progress Note     Subjective: Pt continues says pain is much better compared to yesterday.  He apparently got his soft diet at lunch which he thinks caused him some stomach upset.  The patient continues to sneak out to smoke cigs and e-cigs.  Ambulating fine.  Having BM's and flatus.  Still has some gas pains.  Objective: Vital signs in last 24 hours: Temp:  [98 F (36.7 C)-99.5 F (37.5 C)] 98.5 F (36.9 C) (06/14 0523) Pulse Rate:  [63-76] 63 (06/14 0523) Resp:  [16-18] 16 (06/14 0523) BP: (112-154)/(66-85) 112/66 mmHg (06/14 0523) SpO2:  [94 %-100 %] 94 % (06/14 0523) Last BM Date: 03/15/15  Intake/Output from previous day: 06/13 0701 - 06/14 0700 In: 1570 [P.O.:720; I.V.:800; IV Piggyback:50] Out: 1200 [Urine:1200] Intake/Output this shift:    PE: Gen:  Alert, NAD, pleasant Abd: Soft, mild distension, tender over lower abdomen, +BS, no HSM  Lab Results:   Recent Labs  03/15/15 0428 03/16/15 0445  WBC 8.8 7.5  HGB 14.0 14.7  HCT 42.2 43.2  PLT 205 231   BMET  Recent Labs  03/16/15 0445  NA 141  K 3.9  CL 104  CO2 29  GLUCOSE 92  BUN 8  CREATININE 1.03  CALCIUM 9.4   PT/INR No results for input(s): LABPROT, INR in the last 72 hours. CMP     Component Value Date/Time   NA 141 03/16/2015 0445   NA 141 02/23/2013 2133   K 3.9 03/16/2015 0445   K 3.5 02/23/2013 2133   CL 104 03/16/2015 0445   CL 109* 02/23/2013 2133   CO2 29 03/16/2015 0445   CO2 29 02/23/2013 2133   GLUCOSE 92 03/16/2015 0445   GLUCOSE 75 02/23/2013 2133   BUN 8 03/16/2015 0445   BUN 11 02/23/2013 2133   CREATININE 1.03 03/16/2015 0445   CREATININE 0.96 02/23/2013 2133   CALCIUM 9.4 03/16/2015 0445   CALCIUM 8.6 02/23/2013 2133   PROT 7.1 03/12/2015 0155   PROT 7.0 02/23/2013 2133   ALBUMIN 4.0 03/12/2015 0155   ALBUMIN 3.7 02/23/2013 2133   AST 16 03/12/2015 0155   AST 21 02/23/2013 2133   ALT 18 03/12/2015 0155   ALT 21 02/23/2013 2133    ALKPHOS 66 03/12/2015 0155   ALKPHOS 61 02/23/2013 2133   BILITOT 1.0 03/12/2015 0155   GFRNONAA >60 03/16/2015 0445   GFRNONAA >60 02/23/2013 2133   GFRAA >60 03/16/2015 0445   GFRAA >60 02/23/2013 2133   Lipase     Component Value Date/Time   LIPASE 23 04/27/2009 2050       Studies/Results: Ct Abdomen Pelvis W Contrast  03/15/2015   CLINICAL DATA:  Postprandial abdominal pain, nausea  EXAM: CT ABDOMEN AND PELVIS WITH CONTRAST  TECHNIQUE: Multidetector CT imaging of the abdomen and pelvis was performed using the standard protocol following bolus administration of intravenous contrast.  CONTRAST:  140mL OMNIPAQUE IOHEXOL 300 MG/ML  SOLN  COMPARISON:  03/12/2015  FINDINGS: Lower chest:  Mild patchy opacity/atelectasis at the left lung base.  Hepatobiliary: Liver is within normal limits. No suspicious/enhancing hepatic lesions.  Gallbladder is unremarkable. No intrahepatic or extrahepatic ductal dilatation.  Pancreas: Within normal limits.  Spleen: Within normal limits. Small splenules in the left upper abdomen.  Adrenals/Urinary Tract: Adrenal glands are within normal limits.  Kidneys are within normal limits.  No hydronephrosis.  Bladder is within normal limits.  Stomach/Bowel: Stomach is within normal limits.  No evidence of bowel  obstruction.  Normal appendix.  Sigmoid wall thickening, reflecting sigmoid colitis versus diverticulitis, with associated localized perforation with scattered foci of gas in the sigmoid mesocolon (series 21/ image 75), measuring approximately 1.4 x 1.8 x 2.4 cm in aggregate.  No associated drainable fluid collection/abscess or well-defined rim to suggest a single contain collection. No free air.  Vascular/Lymphatic: No evidence of abdominal aortic aneurysm.  No suspicious abdominopelvic lymphadenopathy.  Reproductive: Prostate is unremarkable.  Other: No abdominopelvic ascites.  Musculoskeletal: Mild degenerative changes of the lower lumbar spine.  IMPRESSION: Sigmoid  colitis versus diverticulitis.  Associated localized perforation with scattered foci of gas in the sigmoid mesocolon, as described above.  No drainable fluid collection/abscess.  No free air.   Electronically Signed   By: Julian Hy M.D.   On: 03/15/2015 19:52   Dg Abd Portable 2v  03/15/2015   CLINICAL DATA:  Right lower abdominal pain for 3 days.  Fever.  EXAM: PORTABLE ABDOMEN - 2 VIEW  COMPARISON:  CT abdomen pelvis 03/12/2015.  FINDINGS: Stool is seen in the cecum and ascending colon. No small bowel dilatation. No unexpected radiopaque calculi. No free air.  IMPRESSION: Stool in the cecum and ascending colon.  No acute findings.   Electronically Signed   By: Lorin Picket M.D.   On: 03/15/2015 16:02    Anti-infectives: Anti-infectives    Start     Dose/Rate Route Frequency Ordered Stop   03/15/15 1500  piperacillin-tazobactam (ZOSYN) IVPB 3.375 g     3.375 g 12.5 mL/hr over 240 Minutes Intravenous 3 times per day 03/15/15 1450     03/15/15 1000  amoxicillin-clavulanate (AUGMENTIN) 875-125 MG per tablet 1 tablet  Status:  Discontinued     1 tablet Oral Every 12 hours 03/15/15 0931 03/15/15 1450   03/12/15 1200  piperacillin-tazobactam (ZOSYN) IVPB 3.375 g  Status:  Discontinued     3.375 g 12.5 mL/hr over 240 Minutes Intravenous 3 times per day 03/12/15 0619 03/15/15 0931   03/12/15 0330  piperacillin-tazobactam (ZOSYN) IVPB 3.375 g     3.375 g 100 mL/hr over 30 Minutes Intravenous  Once 03/12/15 0328 03/12/15 0506   03/12/15 0330  metroNIDAZOLE (FLAGYL) IVPB 500 mg     500 mg 100 mL/hr over 60 Minutes Intravenous  Once 03/12/15 0328 03/12/15 0506       Assessment/Plan Sigmoid colitis with small localized perforation (?divertic vs colitis) -Continues to improve. No fever. No tachycardia. Wbc now normal. Subjectively feels better.  -re-advance to fulls, soft at dinner if tolerating -IV antibiotics, red. IVF, encouraged orals for pain control -Bowel regimen started -Seems  to be improving, so he may not require colectomy with colostomy this admission -Ambulate and IS -SCD's and lovenox  -On zosyn 4 days, Augmentin day #2 -Dietitian for low residue diet -D/c home tomorrow to follow up if all goes well with Dr. Redmond Pulling in 2-3 weeks (will arrange), 1 week abx at discharge, will need CSP in 6-8 weeks  Tobacco abuse -Nicotine patch gave him nausea -He's using e-sigs and sigs outside -I've discussed the effects of smoking on his intestinal tract as well as the rest of his body, he's not willing to quit at this time, but understands the consequences of his smoking -Discussed cutting back on smoking and eventual smoking cessation    LOS: 4 days    Nat Christen 03/16/2015, 7:35 AM Pager: [phone]

## 2015-03-16 NOTE — Progress Notes (Signed)
Brief Nutrition Note  Attempted diet education x 2, however, pt asleep at time of both visits. Will reattempt tomorrow AM.   Narda Amber A. Jimmye Norman, RD, LDN, CDE Pager: [phone] After hours Pager: [phone]

## 2015-03-17 MED ORDER — AMOXICILLIN-POT CLAVULANATE 875-125 MG PO TABS: 1.0000 | ORAL_TABLET | Freq: Two times a day (BID) | ORAL | Status: DC

## 2015-03-17 MED ORDER — POLYETHYLENE GLYCOL 3350 17 G PO PACK
17.0000 g | PACK | Freq: Every day | ORAL | Status: DC | PRN
Start: 2015-03-17 — End: 2016-04-12

## 2015-03-17 MED ORDER — ACETAMINOPHEN 325 MG PO TABS: 325.0000 mg | ORAL_TABLET | Freq: Four times a day (QID) | ORAL | Status: DC | PRN

## 2015-03-17 MED ORDER — DOCUSATE SODIUM 100 MG PO CAPS: 100.0000 mg | ORAL_CAPSULE | Freq: Two times a day (BID) | ORAL | Status: DC | PRN

## 2015-03-17 NOTE — Progress Notes (Signed)
Discharge paperwork given and reviewed with patient. Work note given and reviewed with patient. No questions verbalized by patient. Patient wants to walk down to valet area to discharge.

## 2015-03-17 NOTE — Discharge Summary (Signed)
[facility] Discharge Summary   Patient ID: Eric Peck MRN: [id number] DOB/AGE: [birth year] 37 y.o.  Admit date: 03/12/2015 Discharge date: 03/17/2015  Admitting Diagnosis: Acute colitis with perforation  Discharge Diagnosis Patient Active Problem List   Diagnosis Date Noted  . Acute colitis with perforation 03/12/2015    Consultants None  Imaging: Ct Abdomen Pelvis W Contrast  03/15/2015   CLINICAL DATA:  Postprandial abdominal pain, nausea  EXAM: CT ABDOMEN AND PELVIS WITH CONTRAST  TECHNIQUE: Multidetector CT imaging of the abdomen and pelvis was performed using the standard protocol following bolus administration of intravenous contrast.  CONTRAST:  114mL OMNIPAQUE IOHEXOL 300 MG/ML  SOLN  COMPARISON:  03/12/2015  FINDINGS: Lower chest:  Mild patchy opacity/atelectasis at the left lung base.  Hepatobiliary: Liver is within normal limits. No suspicious/enhancing hepatic lesions.  Gallbladder is unremarkable. No intrahepatic or extrahepatic ductal dilatation.  Pancreas: Within normal limits.  Spleen: Within normal limits. Small splenules in the left upper abdomen.  Adrenals/Urinary Tract: Adrenal glands are within normal limits.  Kidneys are within normal limits.  No hydronephrosis.  Bladder is within normal limits.  Stomach/Bowel: Stomach is within normal limits.  No evidence of bowel obstruction.  Normal appendix.  Sigmoid wall thickening, reflecting sigmoid colitis versus diverticulitis, with associated localized perforation with scattered foci of gas in the sigmoid mesocolon (series 21/ image 75), measuring approximately 1.4 x 1.8 x 2.4 cm in aggregate.  No associated drainable fluid collection/abscess or well-defined rim to suggest a single contain collection. No free air.  Vascular/Lymphatic: No evidence of abdominal aortic aneurysm.  No suspicious abdominopelvic lymphadenopathy.  Reproductive: Prostate is unremarkable.  Other: No abdominopelvic ascites.   Musculoskeletal: Mild degenerative changes of the lower lumbar spine.  IMPRESSION: Sigmoid colitis versus diverticulitis.  Associated localized perforation with scattered foci of gas in the sigmoid mesocolon, as described above.  No drainable fluid collection/abscess.  No free air.   Electronically Signed   By: Julian Hy M.D.   On: 03/15/2015 19:52   Dg Abd Portable 2v  03/15/2015   CLINICAL DATA:  Right lower abdominal pain for 3 days.  Fever.  EXAM: PORTABLE ABDOMEN - 2 VIEW  COMPARISON:  CT abdomen pelvis 03/12/2015.  FINDINGS: Stool is seen in the cecum and ascending colon. No small bowel dilatation. No unexpected radiopaque calculi. No free air.  IMPRESSION: Stool in the cecum and ascending colon.  No acute findings.   Electronically Signed   By: Lorin Picket M.D.   On: 03/15/2015 16:02    Procedures None  Hospital Course:  Merrill, 39 y/o white male smoker, developed acute onset of lower abdominal pain early yesterday morning. For the past week, he has had fewer bowel movements than normal.  He took some stool softeners as a result. The lower abdominal pain came on acutely and persisted so he came to the emergency department for further evaluation. Workup reveals leukocytosis of 17,900. CT scan of the abdomen and pelvis was performed demonstrating sigmoid colitis with small localized perforation. He has been stable in the emergency department.  Patient was admitted and was transferred to the floor for conservative management of his perforation including bowel rest, IVF, IV antibitotics.  Pain took several days to resolve, but eventually diet was advanced as tolerated.  He was maintained on IV Zosyn and transitioned to Augmentin.  WBC returned to normal as well as vital signs.  On HD #6, the patient was voiding well, tolerating diet, ambulating well, pain well controlled, vital signs  stable, and felt stable for discharge home.  Patient will follow up in our office in 2-3 weeks with Dr.  Redmond Pulling and knows to call with questions or concerns.  He will require a colonoscopy in 6-8 weeks which our office will help arrange this.  He is strongly encouraged to cut back on smoking as this could have contributed to his colitis.  He was educated on a low residue/low fiber diet at discharge by the dietitians.  Physical Exam: General:  Alert, NAD, pleasant, comfortable Abd:  Soft, ND, mild tenderness    Medication List    TAKE these medications        acetaminophen 325 MG tablet  Commonly known as:  TYLENOL  Take 1-2 tablets (325-650 mg total) by mouth every 6 (six) hours as needed for fever, headache, mild pain or moderate pain.     amoxicillin-clavulanate 875-125 MG per tablet  Commonly known as:  AUGMENTIN  Take 1 tablet by mouth 2 (two) times daily.     docusate sodium 100 MG capsule  Commonly known as:  COLACE  Take 1 capsule (100 mg total) by mouth 2 (two) times daily as needed for mild constipation.     ibuprofen 200 MG tablet  Commonly known as:  ADVIL,MOTRIN  Take 800-1,400 mg by mouth every 8 (eight) hours as needed for moderate pain.     polyethylene glycol packet  Commonly known as:  MIRALAX / GLYCOLAX  Take 17 g by mouth daily as needed.         Follow-up Information    Follow up with Gayland Curry, MD. Call in 2 weeks.   Specialty:  General Surgery   Why:  For post-hospital follow up, call the office to confirm date/time   Contact information:   [address] [place] [address] [phone]       Call to follow up.   Why:  **Our office will set you up for a colonoscopy in 6-8 weeks from now.  Call to confirm date/time of this.      Signed: Nat Christen, [facility] [phone]  03/17/2015, 8:39 AM

## 2015-03-17 NOTE — Discharge Instructions (Signed)
Colitis Colitis is inflammation of the colon. Colitis can be a short-term or long-standing (chronic) illness. Crohn's disease and ulcerative colitis are 2 types of colitis which are chronic. They usually require lifelong treatment. CAUSES  There are many different causes of colitis, including:  Viruses.  Germs (bacteria).  Medicine reactions. SYMPTOMS   Diarrhea.  Intestinal bleeding.  Pain.  Fever.  Throwing up (vomiting).  Tiredness (fatigue).  Weight loss.  Bowel blockage. DIAGNOSIS  The diagnosis of colitis is based on examination and stool or blood tests. X-rays, CT scan, and colonoscopy may also be needed. TREATMENT  Treatment may include:  Fluids given through the vein (intravenously).  Bowel rest (nothing to eat or drink for a period of time).  Medicine for pain and diarrhea.  Medicines (antibiotics) that kill germs.  Cortisone medicines.  Surgery. HOME CARE INSTRUCTIONS   Get plenty of rest.  Drink enough water and fluids to keep your urine clear or pale yellow.  Eat a well-balanced diet.  Call your caregiver for follow-up as recommended. SEEK IMMEDIATE MEDICAL CARE IF:   You develop chills.  You have an oral temperature above 102 F (38.9 C), not controlled by medicine.  You have extreme weakness, fainting, or dehydration.  You have repeated vomiting.  You develop severe belly (abdominal) pain or are passing bloody or tarry stools. MAKE SURE YOU:   Understand these instructions.  Will watch your condition.  Will get help right away if you are not doing well or get worse. Document Released: 10/26/2004 Document Revised: 12/11/2011 Document Reviewed: 01/21/2010 [facility] Patient Information 2015 [place], [place]. This information is not intended to replace advice given to you by your health care provider. Make sure you discuss any questions you have with your health care provider.   Low-Fiber Diet Fiber is found in fruits, vegetables,  and whole grains. A low-fiber diet restricts fibrous foods that are not digested in the small intestine. A diet containing about 10-15 grams of fiber per day is considered low fiber. Low-fiber diets may be used to:  Promote healing and rest the bowel during intestinal flare-ups.  Prevent blockage of a partially obstructed or narrowed gastrointestinal tract.  Reduce fecal weight and volume.  Slow the movement of feces. You may be on a low-fiber diet as a transitional diet following surgery, after an injury (trauma), or because of a short (acute) or lifelong (chronic) illness. Your health care provider will determine the length of time you need to stay on this diet.  WHAT DO I NEED TO KNOW ABOUT A LOW-FIBER DIET? Always check the fiber content on the packaging's Nutrition Facts label, especially on foods from the grains list. Ask your dietitian if you have questions about specific foods that are related to your condition, especially if the food is not listed below. In general, a low-fiber food will have less than 2 g of fiber. WHAT FOODS CAN I EAT? Grains All breads and crackers made with white flour. Sweet rolls, doughnuts, waffles, pancakes, [place] toast, bagels. Pretzels, Melba toast, zwieback. Well-cooked cereals, such as cornmeal, farina, or cream cereals. Dry cereals that do not contain whole grains, fruit, or nuts, such as refined corn, wheat, rice, and oat cereals. Potatoes prepared any way without skins, plain pastas and noodles, refined white rice. Use white flour for baking and making sauces. Use allowed list of grains for casseroles, dumplings, and puddings.  Vegetables Strained tomato and vegetable juices. Fresh lettuce, cucumber, spinach. Well-cooked (no skin or pulp) or canned vegetables, such as  asparagus, bean sprouts, beets, carrots, green beans, mushrooms, potatoes, pumpkin, spinach, yellow squash, tomato sauce/puree, turnips, yams, and zucchini. Keep servings limited to  cup.    Fruits All fruit juices except prune juice. Cooked or canned fruits without skin and seeds, such as applesauce, apricots, cherries, fruit cocktail, grapefruit, grapes, mandarin oranges, melons, peaches, pears, pineapple, and plums. Fresh fruits without skin, such as apricots, avocados, bananas, melons, pineapple, nectarines, and peaches. Keep servings limited to  cup or 1 piece.  Meat and Other Protein Sources Ground or well-cooked tender beef, ham, veal, lamb, pork, or poultry. Eggs, plain cheese. Fish, oysters, shrimp, lobster, and other seafood. Liver, organ meats. Smooth nut butters. Dairy All milk products and alternative dairy substitutes, such as soy, rice, almond, and coconut, not containing added whole nuts, seeds, or added fruit. Beverages Decaf coffee, fruit, and vegetable juices or smoothies (small amounts, with no pulp or skins, and with fruits from allowed list), sports drinks, herbal tea. Condiments Ketchup, mustard, vinegar, cream sauce, cheese sauce, cocoa powder. Spices in moderation, such as allspice, basil, bay leaves, celery powder or leaves, cinnamon, cumin powder, curry powder, ginger, mace, marjoram, onion or garlic powder, oregano, paprika, parsley flakes, ground pepper, rosemary, sage, savory, tarragon, thyme, and turmeric. Sweets and Desserts Plain cakes and cookies, pie made with allowed fruit, pudding, custard, cream pie. Gelatin, fruit, ice, sherbet, frozen ice pops. Ice cream, ice milk without nuts. Plain hard candy, honey, jelly, molasses, syrup, sugar, chocolate syrup, gumdrops, marshmallows. Limit overall sugar intake.  Fats and Oil Margarine, butter, cream, mayonnaise, salad oils, plain salad dressings made from allowed foods. Choose healthy fats such as olive oil, canola oil, and omega-3 fatty acids (such as found in salmon or tuna) when possible.  Other Bouillon, broth, or cream soups made from allowed foods. Any strained soup. Casseroles or mixed dishes made  with allowed foods. The items listed above may not be a complete list of recommended foods or beverages. Contact your dietitian for more options.  WHAT FOODS ARE NOT RECOMMENDED? Grains All whole wheat and whole grain breads and crackers. Multigrains, rye, bran seeds, nuts, or coconut. Cereals containing whole grains, multigrains, bran, coconut, nuts, raisins. Cooked or dry oatmeal, steel-cut oats. Coarse wheat cereals, granola. Cereals advertised as high fiber. Potato skins. Whole grain pasta, wild or brown rice. Popcorn. Coconut flour. Bran, buckwheat, corn bread, multigrains, rye, wheat germ.  Vegetables Fresh, cooked or canned vegetables, such as artichokes, asparagus, beet greens, broccoli, Brussels sprouts, cabbage, celery, cauliflower, corn, eggplant, kale, legumes or beans, okra, peas, and tomatoes. Avoid large servings of any vegetables, especially raw vegetables.  Fruits Fresh fruits, such as apples with or without skin, berries, cherries, figs, grapes, grapefruit, guavas, kiwis, mangoes, oranges, papayas, pears, persimmons, pineapple, and pomegranate. Prune juice and juices with pulp, stewed or dried prunes. Dried fruits, dates, raisins. Fruit seeds or skins. Avoid large servings of all fresh fruits. Meats and Other Protein Sources Tough, fibrous meats with gristle. Chunky nut butter. Cheese made with seeds, nuts, or other foods not recommended. Nuts, seeds, legumes (beans, including baked beans), dried peas, beans, lentils.  Dairy Yogurt or cheese that contains nuts, seeds, or added fruit.  Beverages Fruit juices with high pulp, prune juice. Caffeinated coffee and teas.  Condiments Coconut, maple syrup, pickles, olives. Sweets and Desserts Desserts, cookies, or candies that contain nuts or coconut, chunky peanut butter, dried fruits. Jams, preserves with seeds, marmalade. Large amounts of sugar and sweets. Any other dessert made with fruits from the  not recommended list.  Other Soups  made from vegetables that are not recommended or that contain other foods not recommended.  The items listed above may not be a complete list of foods and beverages to avoid. Contact your dietitian for more information. Document Released: 03/10/2002 Document Revised: 09/23/2013 Document Reviewed: 08/11/2013 [facility] Patient Information 2015 [place], [place]. This information is not intended to replace advice given to you by your health care provider. Make sure you discuss any questions you have with your health care provider.

## 2015-04-19 ENCOUNTER — Other Ambulatory Visit: Payer: Self-pay | Admitting: General Surgery

## 2015-04-19 NOTE — H&P (Signed)
Eric Peck 04/19/2015 10:49 AM Location: [facility] Patient #: [id number] DOB: [birth year] Single / Language: Eric Peck / Race: White Male History of Present Illness [name] MD; 0/16/0109 11:49 AM) The patient is a 39 year old male who presents to discuss consultation. Pt was admitted to our service on June 10 for sigmoid colitis with a microperforation. He was discharged on June 15. He was medically managed. It was not entirely clear if his sigmoid colitis was due to underlying diverticular disease since there was not many diverticuli noted on his CT scan. He completed his outpatient antibiotics. He denies any fever or chills. He is having daily bowel movements. He has occasional pain with bending. He denies any lower abdominal pain like he had in the hospital. He denies any melena, hematochezia or dysuria. He has never had a colonoscopy before. He does have a family history of Crohn's disease in a couple cousins. He denies any family history of colon cancer or otehr GI malignancies. Other Problems [name], CMA; 04/19/2015 10:52 AM) Anxiety Disorder Arthritis Back Pain Gastroesophageal Reflux Disease Migraine Headache  Diagnostic Studies History [name], CMA; 04/19/2015 10:52 AM) Colonoscopy never  Allergies [name], CMA; 04/19/2015 10:52 AM) No Known Drug Allergies 04/01/2015  Medication History [name], CMA; 04/19/2015 10:52 AM) Ibuprofen (200MG  Capsule, Oral) Active. MiraLax (Oral) Active. Colace (100MG  Capsule, Oral) Active. Medications Reconciled  Social History [name], [place]; 04/19/2015 10:52 AM) Alcohol use Occasional alcohol use. Caffeine use Carbonated beverages.  Family History [name], [place]; 04/19/2015 10:52 AM) Alcohol Abuse Mother. Cervical Cancer Mother. Diabetes Mellitus Father. Heart Disease Father. Heart disease in male family member before age 2 Kidney Disease Father. Ovarian Cancer  Mother.     Review of Systems [name] CMA; 04/19/2015 10:52 AM) General Present- Fatigue. Not Present- Appetite Loss, Chills, Fever, Night Sweats, Weight Gain and Weight Loss. HEENT Present- Seasonal Allergies. Not Present- Earache, Hearing Loss, Hoarseness, Nose Bleed, Oral Ulcers, Ringing in the Ears, Sinus Pain, Sore Throat, Visual Disturbances, Wears glasses/contact lenses and Yellow Eyes. Respiratory Not Present- Bloody sputum, Chronic Cough, Difficulty Breathing, Snoring and Wheezing. Cardiovascular Not Present- Chest Pain, Difficulty Breathing Lying Down, Leg Cramps, Palpitations, Rapid Heart Rate, Shortness of Breath and Swelling of Extremities. Gastrointestinal Present- Bloating. Not Present- Abdominal Pain, Bloody Stool, Change in Bowel Habits, Chronic diarrhea, Constipation, Difficulty Swallowing, Excessive gas, Gets full quickly at meals, Hemorrhoids, Indigestion, Nausea, Rectal Pain and Vomiting. Male Genitourinary Present- Change in Urinary Stream. Not Present- Blood in Urine, Frequency, Impotence, Nocturia, Painful Urination, Urgency and Urine Leakage. Neurological Present- Tingling. Not Present- Decreased Memory, Fainting, Headaches, Numbness, Seizures, Tremor, Trouble walking and Weakness. Endocrine Present- Cold Intolerance. Not Present- Excessive Hunger, Hair Changes, Heat Intolerance, Hot flashes and New Diabetes.  Vitals [name] CMA; 04/19/2015 10:52 AM) 04/19/2015 10:52 AM Weight: 239 lb Height: 72in Body Surface Area: 2.35 m Body Mass Index: 32.41 kg/m Temp.: 98.79F(Oral)  Pulse: 77 (Regular)  BP: 138/78 (Sitting, Left Arm, Standard)     Assessment & Plan [name] MD; 12/23/5571 11:10 AM)  COLITIS PRESUMED INFECTIOUS (009.1  A09) Impression: 39 year old male who presents to the office approximately 5 weeks after being hospitalized for colitis with microperforation. He was seen in follow-up by Dr. Redmond Pulling who has requested a colonoscopy to  evaluate his colon further. It was unclear if this was diverticulitis or colitis from another source. I agree with Dr. Redmond Pulling that he will need a colonoscopy to complete his follow-up. We discussed the risk of  this in detail which include bleeding and perforation. The patient has a somewhat torturous sigmoid colon on CT scan and we discussed possible implications of this and possible need to abort the procedure. I believe he understands these risk and has agreed to proceed with colonoscopy.

## 2015-04-23 ENCOUNTER — Ambulatory Visit (HOSPITAL_COMMUNITY)
Admission: RE | Admit: 2015-04-23 | Discharge: 2015-04-23 | Disposition: A | Discharge status: Home / Self Care | Payer: Medicaid Other | Source: Ambulatory Visit | Attending: General Surgery | Admitting: General Surgery

## 2015-04-23 ENCOUNTER — Encounter (HOSPITAL_COMMUNITY): Payer: Self-pay | Admitting: *Deleted

## 2015-04-23 ENCOUNTER — Encounter (HOSPITAL_COMMUNITY)
Admission: RE | Disposition: A | Discharge status: Home / Self Care | Payer: Self-pay | Source: Ambulatory Visit | Attending: General Surgery

## 2015-04-23 DIAGNOSIS — Z7982 Long term (current) use of aspirin: Secondary | ICD-10-CM | POA: Insufficient documentation

## 2015-04-23 DIAGNOSIS — F419 Anxiety disorder, unspecified: Secondary | ICD-10-CM | POA: Insufficient documentation

## 2015-04-23 DIAGNOSIS — M1990 Unspecified osteoarthritis, unspecified site: Secondary | ICD-10-CM | POA: Insufficient documentation

## 2015-04-23 DIAGNOSIS — K5720 Diverticulitis of large intestine with perforation and abscess without bleeding: Secondary | ICD-10-CM | POA: Diagnosis present

## 2015-04-23 DIAGNOSIS — K219 Gastro-esophageal reflux disease without esophagitis: Secondary | ICD-10-CM | POA: Insufficient documentation

## 2015-04-23 DIAGNOSIS — Z79899 Other long term (current) drug therapy: Secondary | ICD-10-CM | POA: Diagnosis not present

## 2015-04-23 DIAGNOSIS — G43909 Migraine, unspecified, not intractable, without status migrainosus: Secondary | ICD-10-CM | POA: Diagnosis not present

## 2015-04-23 DIAGNOSIS — Z8379 Family history of other diseases of the digestive system: Secondary | ICD-10-CM | POA: Diagnosis not present

## 2015-04-23 HISTORY — PX: COLONOSCOPY: SHX5424

## 2015-04-23 SURGERY — COLONOSCOPY
Anesthesia: Moderate Sedation

## 2015-04-23 MED ORDER — DIPHENHYDRAMINE HCL 50 MG/ML IJ SOLN
INTRAMUSCULAR | Status: AC
  Filled 2015-04-23: qty 1

## 2015-04-23 MED ORDER — FENTANYL CITRATE (PF) 100 MCG/2ML IJ SOLN
INTRAMUSCULAR | Status: DC | PRN
  Administered 2015-04-23 (×4): 25 ug via INTRAVENOUS

## 2015-04-23 MED ORDER — SODIUM CHLORIDE 0.9 % IV SOLN
INTRAVENOUS | Status: DC
  Administered 2015-04-23: 500 mL via INTRAVENOUS

## 2015-04-23 MED ORDER — DIPHENHYDRAMINE HCL 50 MG/ML IJ SOLN
INTRAMUSCULAR | Status: DC | PRN
  Administered 2015-04-23: 25 mg via INTRAVENOUS

## 2015-04-23 MED ORDER — MIDAZOLAM HCL 5 MG/ML IJ SOLN
INTRAMUSCULAR | Status: AC
  Filled 2015-04-23: qty 2

## 2015-04-23 MED ORDER — MIDAZOLAM HCL 10 MG/2ML IJ SOLN
INTRAMUSCULAR | Status: DC | PRN
  Administered 2015-04-23 (×5): 2 mg via INTRAVENOUS

## 2015-04-23 MED ORDER — FENTANYL CITRATE (PF) 100 MCG/2ML IJ SOLN
INTRAMUSCULAR | Status: AC
  Filled 2015-04-23: qty 2

## 2015-04-23 NOTE — Interval H&P Note (Signed)
History and Physical Interval Note:  04/23/2015 8:48 AM  Eric Peck  has presented today for surgery, with the diagnosis of history of colitis with microperforation  The various methods of treatment have been discussed with the patient and family. After consideration of risks, benefits and other options for treatment, the patient has consented to  Procedure(s): DIAGNOSTIC COLONOSCOPY (N/A) as a surgical intervention .  The patient's history has been reviewed, patient examined, no change in status, stable for surgery.  I have reviewed the patient's chart and labs.  Questions were answered to the patient's satisfaction.     Rosario Adie, MD  [facility]

## 2015-04-23 NOTE — Discharge Instructions (Signed)

## 2015-04-23 NOTE — Op Note (Signed)
[facility] [place], [address]   COLONOSCOPY PROCEDURE REPORT  PATIENT: Eric Peck, Eric Peck  MR#: [id number] BIRTHDATE: [birth year] , 39  yrs. old GENDER: male ENDOSCOPIST: Rosario Adie, MD REFERRED IZ:TIWP Redmond Pulling, M.D. PROCEDURE DATE:  04/23/2015 PROCEDURE:   Colonoscopy, diagnostic ASA CLASS:   Class II INDICATIONS:follow up for previously diagnosed diverticulitis. MEDICATIONS: Benadryl 25 mg IV, Fentanyl 100 mcg IV, and Versed 10 mg IV  DESCRIPTION OF PROCEDURE:   After the risks benefits and alternatives of the procedure were thoroughly explained, informed consent was obtained.  revealed no abnormalities of the rectum. The EC-[id number] ([id number])  endoscope was introduced through the anus and advanced to the cecum, which was identified by the ileocecal valve. No adverse events experienced.   The quality of the prep was good.  The instrument was then slowly withdrawn as the colon was fully examined. Estimated blood loss is zero unless otherwise noted in this procedure report.    Findings:   COLON FINDINGS: A circumferential diffuse patch of abnormal mucosa was found in the sigmoid colon.  The mucosa was edematous and congested.  Retroflexed views revealed no abnormalities.  Insertion time was 22 minutes.  Withdrawal time was 8 minutes.  The scope was withdrawn and the procedure completed.  COMPLICATIONS: There were no immediate complications.  ENDOSCOPIC IMPRESSION:     Circumferential diffuse abnormal mucosa was found in the sigmoid colon; The mucosa was edematous and congested  RECOMMENDATIONS:     Repeat Colonoscopy at age 53.  eSigned:  Rosario Adie, MD 82/50/5397 9:49 AM   cc: Greer Pickerel MD  CPT CODES:     [phone] Colonoscopy, flexible, proximal to splenic flexure; diagnostic, with or without collection of specimen(s) by brushing or washing, with or without colon decompression (separate procedure) ICD CODES:     K57.20  Diverticulitis of large intestine with perforation and abscess without bleeding K59.8 Other specified functional intestinal disorders  The ICD and CPT codes recommended by this software are interpretations from the data that the clinical staff has captured with the software.  The verification of the translation of this report to the ICD and CPT codes and modifiers is the sole responsibility of the health care institution and practicing physician where this report was generated.  [employer]. will not be held responsible for the validity of the ICD and CPT codes included on this report.  AMA assumes no liability for data contained or not contained herein. CPT is a Designer, television/film set of the [employer].

## 2015-04-23 NOTE — H&P (View-Only) (Signed)
Eric Peck 04/19/2015 10:49 AM Location: [facility] Patient #: [id number] DOB: [birth year] Single / Language: [name] / Race: White Male History of Present Illness Eric Ruff MD; 6/72/0947 11:49 AM) The patient is a 39 year old male who presents to discuss consultation. Pt was admitted to our service on June 10 for sigmoid colitis with a microperforation. He was discharged on June 15. He was medically managed. It was not entirely clear if his sigmoid colitis was due to underlying diverticular disease since there was not many diverticuli noted on his CT scan. He completed his outpatient antibiotics. He denies any fever or chills. He is having daily bowel movements. He has occasional pain with bending. He denies any lower abdominal pain like he had in the hospital. He denies any melena, hematochezia or dysuria. He has never had a colonoscopy before. He does have a family history of Crohn's disease in a couple cousins. He denies any family history of colon cancer or otehr GI malignancies. Other Problems [name], CMA; 04/19/2015 10:52 AM) Anxiety Disorder Arthritis Back Pain Gastroesophageal Reflux Disease Migraine Headache  Diagnostic Studies History [name], CMA; 04/19/2015 10:52 AM) Colonoscopy never  Allergies [name], CMA; 04/19/2015 10:52 AM) No Known Drug Allergies 04/01/2015  Medication History [name], CMA; 04/19/2015 10:52 AM) Ibuprofen (200MG  Capsule, Oral) Active. MiraLax (Oral) Active. Colace (100MG  Capsule, Oral) Active. Medications Reconciled  Social History [name], [place]; 04/19/2015 10:52 AM) Alcohol use Occasional alcohol use. Caffeine use Carbonated beverages.  Family History [name], [place]; 04/19/2015 10:52 AM) Alcohol Abuse Mother. Cervical Cancer Mother. Diabetes Mellitus Father. Heart Disease Father. Heart disease in male family member before age 64 Kidney Disease Father. Ovarian Cancer  Mother.     Review of Systems [name] CMA; 04/19/2015 10:52 AM) General Present- Fatigue. Not Present- Appetite Loss, Chills, Fever, Night Sweats, Weight Gain and Weight Loss. HEENT Present- Seasonal Allergies. Not Present- Earache, Hearing Loss, Hoarseness, Nose Bleed, Oral Ulcers, Ringing in the Ears, Sinus Pain, Sore Throat, Visual Disturbances, Wears glasses/contact lenses and Yellow Eyes. Respiratory Not Present- Bloody sputum, Chronic Cough, Difficulty Breathing, Snoring and Wheezing. Cardiovascular Not Present- Chest Pain, Difficulty Breathing Lying Down, Leg Cramps, Palpitations, Rapid Heart Rate, Shortness of Breath and Swelling of Extremities. Gastrointestinal Present- Bloating. Not Present- Abdominal Pain, Bloody Stool, Change in Bowel Habits, Chronic diarrhea, Constipation, Difficulty Swallowing, Excessive gas, Gets full quickly at meals, Hemorrhoids, Indigestion, Nausea, Rectal Pain and Vomiting. Male Genitourinary Present- Change in Urinary Stream. Not Present- Blood in Urine, Frequency, Impotence, Nocturia, Painful Urination, Urgency and Urine Leakage. Neurological Present- Tingling. Not Present- Decreased Memory, Fainting, Headaches, Numbness, Seizures, Tremor, Trouble walking and Weakness. Endocrine Present- Cold Intolerance. Not Present- Excessive Hunger, Hair Changes, Heat Intolerance, Hot flashes and New Diabetes.  Vitals [name] CMA; 04/19/2015 10:52 AM) 04/19/2015 10:52 AM Weight: 239 lb Height: 72in Body Surface Area: 2.35 m Body Mass Index: 32.41 kg/m Temp.: 98.75F(Oral)  Pulse: 77 (Regular)  BP: 138/78 (Sitting, Left Arm, Standard)     Assessment & Plan Eric Ruff MD; 0/96/2836 11:10 AM)  COLITIS PRESUMED INFECTIOUS (009.1  A09) Impression: 39 year old male who presents to the office approximately 5 weeks after being hospitalized for colitis with microperforation. He was seen in follow-up by Dr. Redmond Pulling who has requested a colonoscopy to  evaluate his colon further. It was unclear if this was diverticulitis or colitis from another source. I agree with Dr. Redmond Pulling that he will need a colonoscopy to complete his follow-up. We discussed the risk of  this in detail which include bleeding and perforation. The patient has a somewhat torturous sigmoid colon on CT scan and we discussed possible implications of this and possible need to abort the procedure. I believe he understands these risk and has agreed to proceed with colonoscopy.

## 2015-04-27 ENCOUNTER — Encounter (HOSPITAL_COMMUNITY): Payer: Self-pay | Admitting: General Surgery

## 2015-04-29 ENCOUNTER — Encounter (HOSPITAL_COMMUNITY): Payer: Self-pay

## 2015-04-29 ENCOUNTER — Emergency Department (HOSPITAL_COMMUNITY)
Admission: EM | Admit: 2015-04-29 | Discharge: 2015-04-29 | Disposition: A | Discharge status: Home / Self Care | Payer: Medicaid Other | Attending: Emergency Medicine | Admitting: Emergency Medicine

## 2015-04-29 ENCOUNTER — Emergency Department (HOSPITAL_COMMUNITY): Payer: Medicaid Other

## 2015-04-29 DIAGNOSIS — Z720 Tobacco use: Secondary | ICD-10-CM | POA: Diagnosis not present

## 2015-04-29 DIAGNOSIS — Z8669 Personal history of other diseases of the nervous system and sense organs: Secondary | ICD-10-CM | POA: Diagnosis not present

## 2015-04-29 DIAGNOSIS — R112 Nausea with vomiting, unspecified: Secondary | ICD-10-CM | POA: Diagnosis not present

## 2015-04-29 DIAGNOSIS — Z8719 Personal history of other diseases of the digestive system: Secondary | ICD-10-CM | POA: Diagnosis not present

## 2015-04-29 DIAGNOSIS — R1084 Generalized abdominal pain: Secondary | ICD-10-CM | POA: Insufficient documentation

## 2015-04-29 DIAGNOSIS — R109 Unspecified abdominal pain: Secondary | ICD-10-CM

## 2015-04-29 DIAGNOSIS — M1990 Unspecified osteoarthritis, unspecified site: Secondary | ICD-10-CM | POA: Insufficient documentation

## 2015-04-29 DIAGNOSIS — R1031 Right lower quadrant pain: Secondary | ICD-10-CM | POA: Insufficient documentation

## 2015-04-29 LAB — COMPREHENSIVE METABOLIC PANEL
ALBUMIN: 4.1 g/dL (ref 3.5–5.0)
ALT: 18 U/L (ref 17–63)
AST: 19 U/L (ref 15–41)
Alkaline Phosphatase: 61 U/L (ref 38–126)
Anion gap: 6 (ref 5–15)
BUN: <5 mg/dL — ABNORMAL LOW (ref 6–20)
CO2: 22 mmol/L (ref 22–32)
CREATININE: 0.87 mg/dL (ref 0.61–1.24)
Calcium: 9.2 mg/dL (ref 8.9–10.3)
Chloride: 111 mmol/L (ref 101–111)
GFR calc Af Amer: >60 mL/min (ref >60)
GFR calc non Af Amer: >60 mL/min (ref >60)
GLUCOSE: 115 mg/dL — AB (ref 65–99)
Potassium: 3.6 mmol/L (ref 3.5–5.1)
Sodium: 139 mmol/L (ref 135–145)
Total Bilirubin: 0.5 mg/dL (ref 0.3–1.2)
Total Protein: 6.8 g/dL (ref 6.5–8.1)

## 2015-04-29 LAB — CBC WITH DIFFERENTIAL/PLATELET
Basophils Absolute: 0 10*3/uL (ref 0.0–0.1)
Basophils Relative: 1 % (ref 0–1)
EOS PCT: 6 % — AB (ref 0–5)
Eosinophils Absolute: 0.4 10*3/uL (ref 0.0–0.7)
HCT: 45.9 % (ref 39.0–52.0)
Hemoglobin: 16.1 g/dL (ref 13.0–17.0)
LYMPHS ABS: 2 10*3/uL (ref 0.7–4.0)
LYMPHS PCT: 34 % (ref 12–46)
MCH: 29.9 pg (ref 26.0–34.0)
MCHC: 35.1 g/dL (ref 30.0–36.0)
MCV: 85.3 fL (ref 78.0–100.0)
MONO ABS: 0.5 10*3/uL (ref 0.1–1.0)
MONOS PCT: 8 % (ref 3–12)
NEUTROS PCT: 51 % (ref 43–77)
Neutro Abs: 3.1 10*3/uL (ref 1.7–7.7)
Platelets: 202 10*3/uL (ref 150–400)
RBC: 5.38 MIL/uL (ref 4.22–5.81)
RDW: 13.4 % (ref 11.5–15.5)
WBC: 6 10*3/uL (ref 4.0–10.5)

## 2015-04-29 LAB — URINALYSIS, ROUTINE W REFLEX MICROSCOPIC
BILIRUBIN URINE: NEGATIVE
Glucose, UA: NEGATIVE mg/dL
Hgb urine dipstick: NEGATIVE
KETONES UR: 15 mg/dL — AB
Nitrite: NEGATIVE
Protein, ur: NEGATIVE mg/dL
SPECIFIC GRAVITY, URINE: 1.027 (ref 1.005–1.030)
Urobilinogen, UA: 1 mg/dL (ref 0.0–1.0)
pH: 8 (ref 5.0–8.0)

## 2015-04-29 LAB — URINE MICROSCOPIC-ADD ON

## 2015-04-29 LAB — LIPASE, BLOOD: LIPASE: 28 U/L (ref 22–51)

## 2015-04-29 IMAGING — CT CT ABD-PELV W/ CM
2 of 4 series · 8 of 46 positions shown, 9 images · IV contrast (Iodine)
Comparison: CT abdomen pelvis dated [DATE].

CLINICAL DATA: Right-sided abdominal pain starting yesterday
morning with nausea and vomiting. History of diverticulitis 3 weeks
ago.

EXAM:
CT ABDOMEN AND PELVIS WITH CONTRAST
TECHNIQUE: Multidetector CT imaging of the abdomen and pelvis was performed
using the standard protocol following bolus administration of
intravenous contrast.
CONTRAST:  100mL OMNIPAQUE IOHEXOL 300 MG/ML  SOLN

[Series 201: routine, idose (2) · axial · 0.85mm/px · z∈[+160,+555]mm · 5 of 109 slices shown, 6 images]
[im 15/109  soft-tissue]
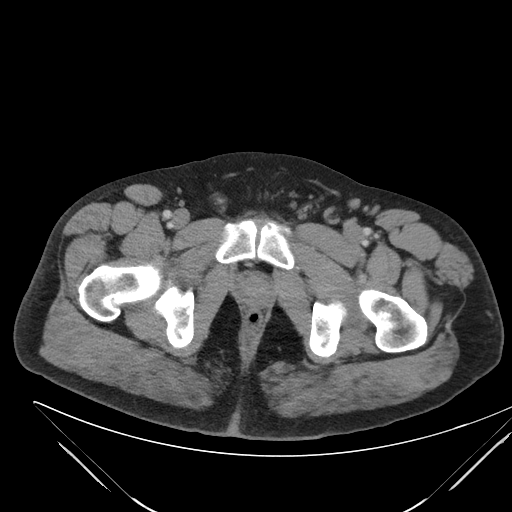
[im 15/109  bone]
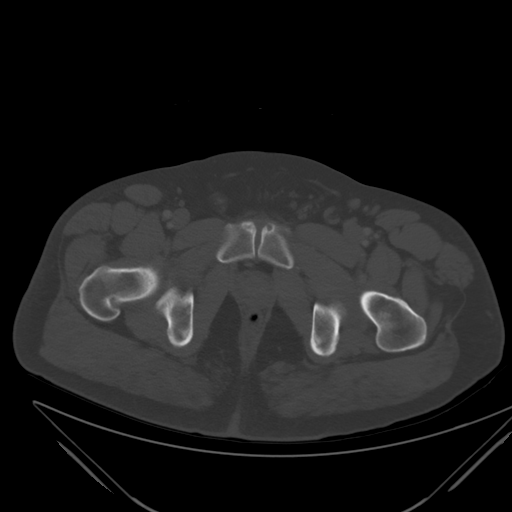
[im 33/109  soft-tissue]
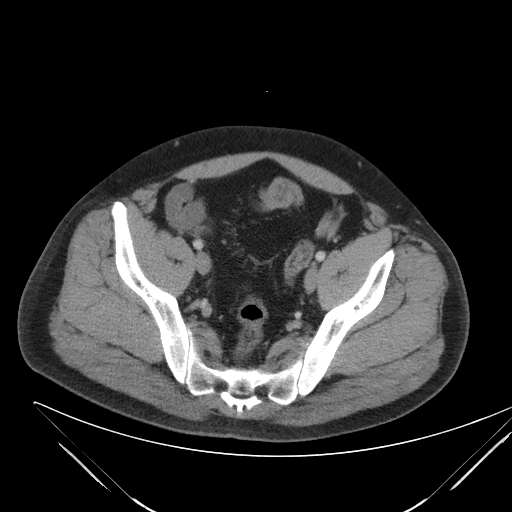
[im 57/109  soft-tissue]
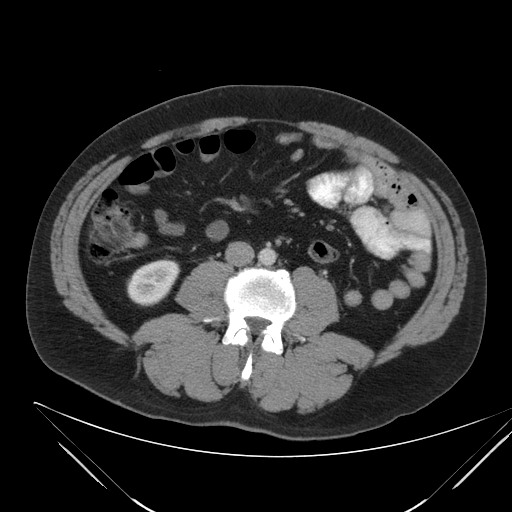
[im 76/109  soft-tissue]
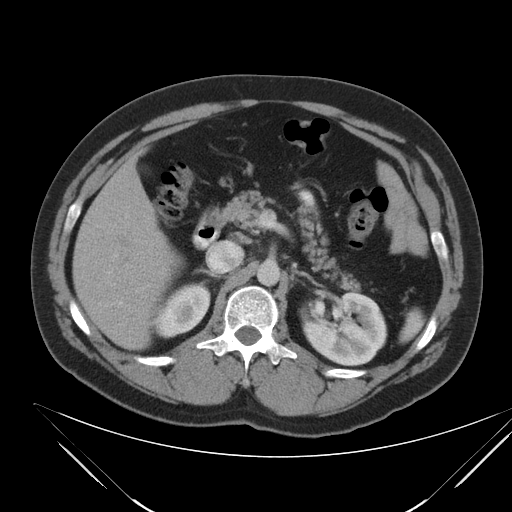
[im 94/109  soft-tissue]
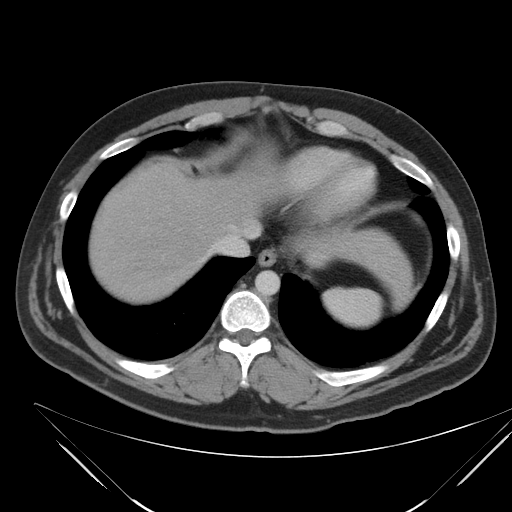

[Series 203: coronals, idose (2) · coronal · 0.45mm/px · 3 of 123 slices shown]
[im 41/123  soft-tissue]
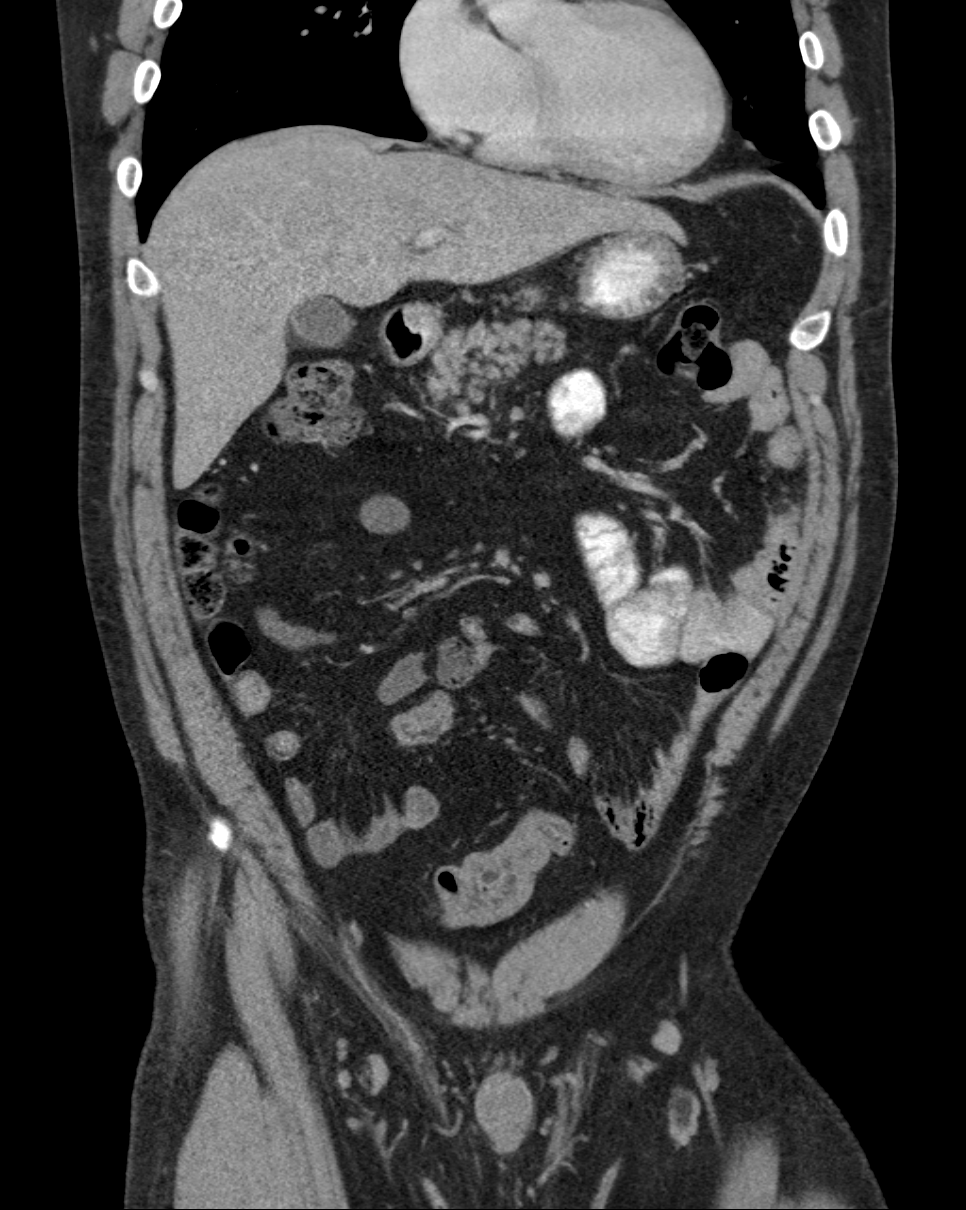
[im 55/123  soft-tissue]
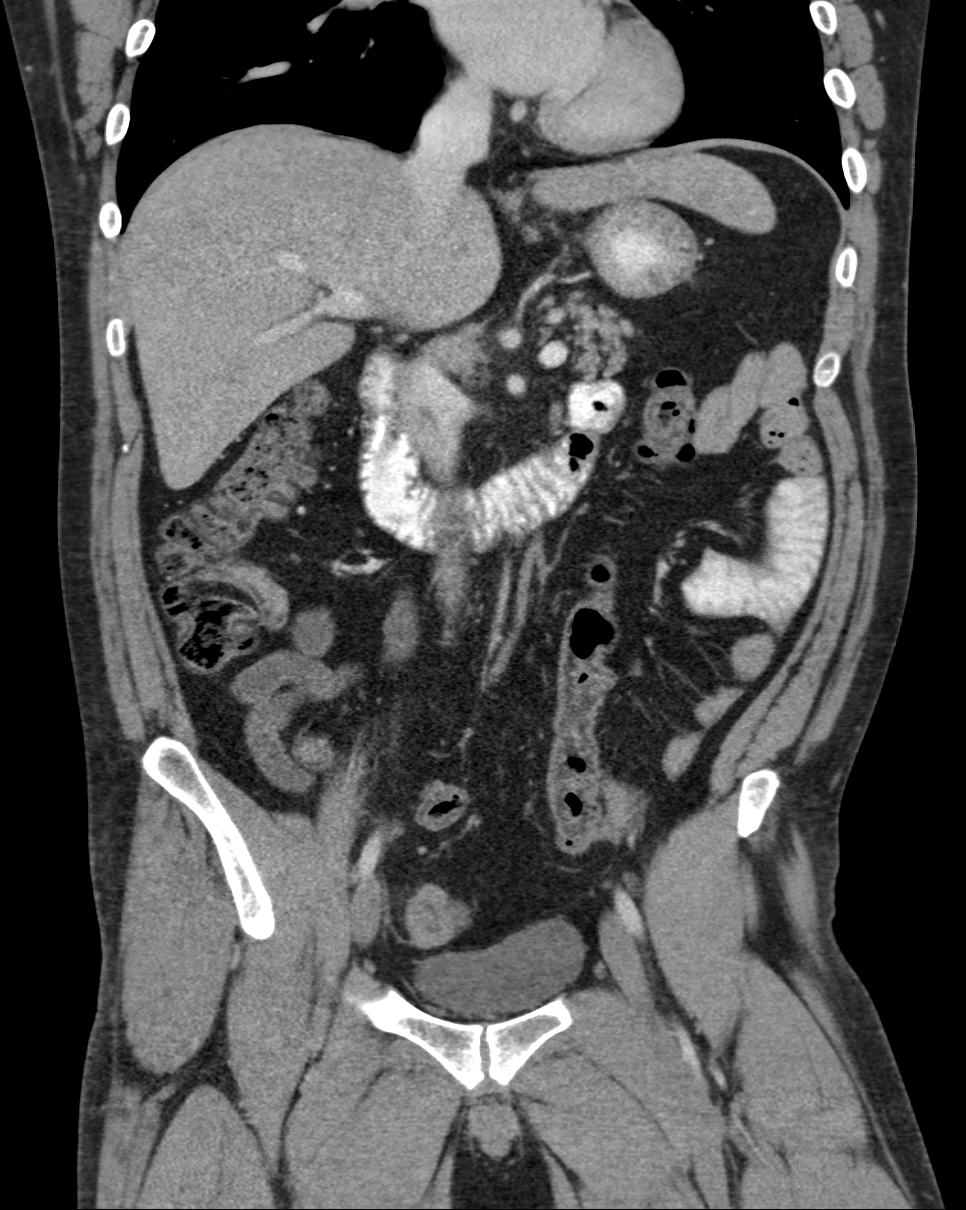
[im 68/123  soft-tissue]
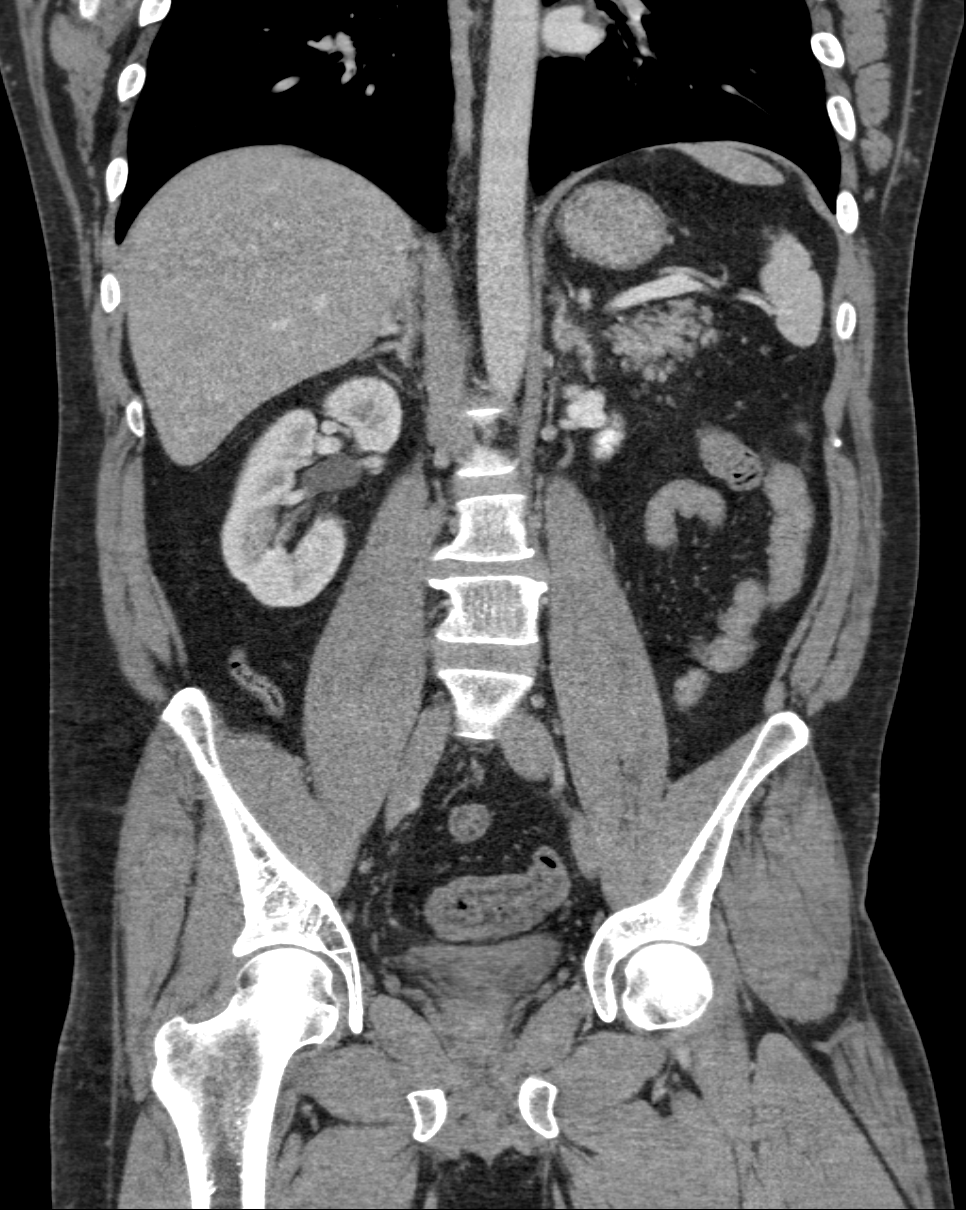

[8 of 46 positions shown; findings below may reference images not displayed]

FINDINGS: Scattered diverticulosis is again seen within the sigmoid colon. The
sigmoid colon diverticulitis identified on CT of [DATE] has
resolved in the interval. The bowel wall thickening in this area is
significantly improved and there is no residual paracolic
inflammation or paracolic gas. Bowel is normal in caliber. No free
fluid or abscess collection identified. No free intraperitoneal air.
Appendix is normal.

Liver, spleen, pancreas, and adrenal glands appear normal. Suspect
mild thickening and enhancement of the walls of the gallbladder but
not convincing. No bile duct dilatation. Kidneys appear normal
without stone or hydronephrosis. Abdominal aorta is normal in
caliber. No enlarged lymph nodes identified in the abdomen or
pelvis. Scattered small and moderate-sized lymph nodes noted within
each inguinal region.

Mild scarring/atelectasis at each lung base. Minimal degenerative
change noted within the lumbar spine but no acute osseous
abnormality.
IMPRESSION: 1. Resolved sigmoid colon diverticulitis. No residual paracolic
inflammation.
2. I suspect mild gallbladder wall thickening and/or pericholecystic
edema with associated gallbladder wall enhancement, but not
convincing. Given the history of right-sided abdominal pain, would
consider right upper quadrant ultrasound to exclude an acute
cholecystitis.

## 2015-04-29 MED ORDER — IOHEXOL 300 MG/ML  SOLN: 25.0000 mL | Freq: Once | INTRAMUSCULAR | Status: DC | PRN

## 2015-04-29 MED ORDER — OXYCODONE-ACETAMINOPHEN 5-325 MG PO TABS
1.0000 | ORAL_TABLET | Freq: Once | ORAL | Status: AC
  Administered 2015-04-29: 1 via ORAL

## 2015-04-29 MED ORDER — ONDANSETRON 4 MG PREPACK (~~LOC~~): 1.0000 | ORAL_TABLET | Freq: Three times a day (TID) | ORAL | Status: DC | PRN

## 2015-04-29 MED ORDER — HYDROMORPHONE HCL 1 MG/ML IJ SOLN
1.0000 mg | Freq: Once | INTRAMUSCULAR | Status: AC
  Administered 2015-04-29: 1 mg via INTRAVENOUS
  Filled 2015-04-29: qty 1

## 2015-04-29 MED ORDER — HYDROCODONE-ACETAMINOPHEN 5-325 MG PO TABS: 1.0000 | ORAL_TABLET | ORAL | Status: DC | PRN

## 2015-04-29 MED ORDER — MORPHINE SULFATE 4 MG/ML IJ SOLN
4.0000 mg | Freq: Once | INTRAMUSCULAR | Status: AC
  Administered 2015-04-29: 4 mg via INTRAVENOUS
  Filled 2015-04-29: qty 1

## 2015-04-29 MED ORDER — HYDROCODONE-ACETAMINOPHEN 5-325 MG PO TABS: 2.0000 | ORAL_TABLET | ORAL | Status: DC | PRN

## 2015-04-29 MED ORDER — OXYCODONE-ACETAMINOPHEN 5-325 MG PO TABS
ORAL_TABLET | ORAL | Status: AC
  Filled 2015-04-29: qty 1

## 2015-04-29 MED ORDER — ONDANSETRON HCL 4 MG/2ML IJ SOLN
4.0000 mg | Freq: Once | INTRAMUSCULAR | Status: AC
  Administered 2015-04-29: 4 mg via INTRAVENOUS
  Filled 2015-04-29: qty 2

## 2015-04-29 MED ORDER — ONDANSETRON HCL 4 MG PO TABS: 4.0000 mg | ORAL_TABLET | Freq: Four times a day (QID) | ORAL | Status: DC

## 2015-04-29 MED ORDER — IOHEXOL 300 MG/ML  SOLN
100.0000 mL | Freq: Once | INTRAMUSCULAR | Status: AC | PRN
  Administered 2015-04-29: 100 mL via INTRAVENOUS

## 2015-04-29 NOTE — ED Notes (Addendum)
Pt presents with 3 day h/o R sided abdominal pain.  Pt reports colonoscopy on Friday, is to follow up Dr. Redmond Pulling on 8/18.  Pt reports h/o diverticulitis with today's symptoms similar.  Pt reports heartburn x 4 days; +nausea, vomiting; reports black stools today.  Pt also reports R testicular pain, has difficulty with beginning stream, denies any swelling to testicle.

## 2015-04-29 NOTE — ED Provider Notes (Signed)
Care transferred to me from [facility], PA-C at 1600.  Pt is a 39 year old male with a history of colitis, vasectomy, and previous bowel perforation presenting for abdominal pain. Please see PA-C Carter's note for full history of present illness, ROS, physical exam.  CT abdomen performed showing no acute abnormalities to indicate the abdominal pain except for possible gallbladder wall thickening. Subsequently right upper quadrant ultrasound performed. Right upper quadrant ultrasound displayed no concerns for cholecystitis at this time. Patient continued to have mild pain in the emergency department but reported it was tolerable. Patient was given red flag return precautions which he understood and given multiple contacts including GI and general surgery to attempt to gain quicker access. Patient subsequently discharged and advised to follow-up as soon as possible with either GI or general surgery.  If performed, labs, EKGs, and imaging were reviewed/interpreted by myself and my attending and incorporated into medical decision making.  Discussed pertinent finding with patient or caregiver prior to discharge with no further questions.  Immediate return precautions given and pt or caregiver reports understanding.  Pt care supervised by my attending Dr. Wyvonnia Dusky.   Geronimo Boot, MD PGY-2  Emergency Medicine   Geronimo Boot, MD 04/30/15 [id number]  Ezequiel Essex, MD 04/30/15 [id number]

## 2015-04-29 NOTE — ED Notes (Signed)
Pt requesting something for the pain. RN informed

## 2015-04-29 NOTE — Discharge Instructions (Signed)

## 2015-04-29 NOTE — ED Provider Notes (Signed)
CSN: [id number]     Arrival date & time 04/29/15  1218 History   First MD Initiated Contact with Patient 04/29/15 1419     No chief complaint on file.    (Consider location/radiation/quality/duration/timing/severity/associated sxs/prior Treatment) HPI Eric Peck is a 39 y.o. male with a history of colitis, vasectomy, comes in for evaluation of abdominal pain. Patient states he was in the hospital a few weeks ago for diverticulitis.. States he had a colonoscopy on Friday and was told that there is still a small hole in his colon. He reports onset of pain yesterday similar to his pain he had with diverticulitis, however he says it is sometimes exacerbated with eating and sometimes radiates into his testicle. Nothing seems to make it better or worse. He reports associated nausea and vomiting. Reports difficulties starting urine stream, but denies any dysuria or hematuria. Denies any scrotal swelling or redness. No fevers or chills. Rates discomfort now has a 8/10. Past Medical History  Diagnosis Date  . CTS (carpal tunnel syndrome)   . Arthritis   . Colitis    Past Surgical History  Procedure Laterality Date  . Vasectomy    . Colonoscopy N/A 04/23/2015    Procedure: DIAGNOSTIC COLONOSCOPY;  Surgeon: [name], MD;  Location: [facility];  Service: Endoscopy;  Laterality: N/A;   Family History  Problem Relation Age of Onset  . COPD Mother   . Drug abuse Mother     EtOH  . Diabetes Father   . Kidney disease Father   . Eczema Brother   . Mental retardation Brother     Down's Syndrome   History  Substance Use Topics  . Smoking status: Current Every Day Smoker -- 1.00 packs/day    Types: Cigarettes  . Smokeless tobacco: Never Used  . Alcohol Use: No    Review of Systems A 10 point review of systems was completed and was negative except for pertinent positives and negatives as mentioned in the history of present illness     Allergies  Review of patient's allergies  indicates no known allergies.  Home Medications   Prior to Admission medications   Medication Sig Start Date End Date Taking? Authorizing Provider  acetaminophen (TYLENOL) 325 MG tablet Take 1-2 tablets (325-650 mg total) by mouth every 6 (six) hours as needed for fever, headache, mild pain or moderate pain. 03/17/15   Nat Christen, PA-C  docusate sodium (COLACE) 100 MG capsule Take 1 capsule (100 mg total) by mouth 2 (two) times daily as needed for mild constipation. 03/17/15   Nat Christen, PA-C  ibuprofen (ADVIL,MOTRIN) 200 MG tablet Take 800-1,400 mg by mouth every 8 (eight) hours as needed for moderate pain.    Historical Provider, MD  polyethylene glycol (MIRALAX / GLYCOLAX) packet Take 17 g by mouth daily as needed. 03/17/15   Megan N Baird, PA-C   BP 131/77 mmHg  Pulse 59  Temp(Src) 98.8 F (37.1 C) (Oral)  Resp 18  Ht 6' (1.829 m)  Wt 220 lb (99.791 kg)  BMI 29.83 kg/m2  SpO2 97% Physical Exam  Constitutional: He is oriented to person, place, and time. He appears well-developed and well-nourished.  Patient appears uncomfortable  HENT:  Head: Normocephalic and atraumatic.  Mouth/Throat: Oropharynx is clear and moist.  Eyes: Conjunctivae are normal. Pupils are equal, round, and reactive to light. Right eye exhibits no discharge. Left eye exhibits no discharge. No scleral icterus.  Neck: Neck supple.  Cardiovascular: Normal rate, regular rhythm and  normal heart sounds.   Pulmonary/Chest: Effort normal and breath sounds normal. No respiratory distress. He has no wheezes. He has no rales.  Abdominal: Soft.  Diffuse abdominal tenderness, worse in right lower quadrant.  Musculoskeletal: He exhibits no tenderness.  Neurological: He is alert and oriented to person, place, and time.  Cranial Nerves II-XII grossly intact  Skin: Skin is warm and dry. No rash noted.  Psychiatric: He has a normal mood and affect.  Nursing note and vitals reviewed.   ED Course  Procedures (including  critical care time) Labs Review Labs Reviewed  URINALYSIS, ROUTINE W REFLEX MICROSCOPIC (NOT AT [facility]) - Abnormal; Notable for the following:    Color, Urine AMBER (*)    APPearance HAZY (*)    Ketones, ur 15 (*)    Leukocytes, UA SMALL (*)    All other components within normal limits  CBC WITH DIFFERENTIAL/PLATELET - Abnormal; Notable for the following:    Eosinophils Relative 6 (*)    All other components within normal limits  COMPREHENSIVE METABOLIC PANEL - Abnormal; Notable for the following:    Glucose, Bld 115 (*)    BUN <5 (*)    All other components within normal limits  LIPASE, BLOOD  URINE MICROSCOPIC-ADD ON  POC OCCULT BLOOD, ED  TYPE AND SCREEN    Imaging Review No results found.   EKG Interpretation None     Meds given in ED:  Medications  oxyCODONE-acetaminophen (PERCOCET/ROXICET) 5-325 MG per tablet (not administered)  iohexol (OMNIPAQUE) 300 MG/ML solution 25 mL (not administered)  oxyCODONE-acetaminophen (PERCOCET/ROXICET) 5-325 MG per tablet 1 tablet (1 tablet Oral Given 04/29/15 1245)  morphine 4 MG/ML injection 4 mg (4 mg Intravenous Given 04/29/15 1458)  ondansetron (ZOFRAN) injection 4 mg (4 mg Intravenous Given 04/29/15 1458)  HYDROmorphone (DILAUDID) injection 1 mg (1 mg Intravenous Given 04/29/15 1612)  iohexol (OMNIPAQUE) 300 MG/ML solution 100 mL (100 mLs Intravenous Contrast Given 04/29/15 1545)    New Prescriptions   No medications on file   Filed Vitals:   04/29/15 1222 04/29/15 1415 04/29/15 1430  BP: 139/75 145/71 131/77  Pulse: 72 62 59  Temp: 98.8 F (37.1 C)    TempSrc: Oral    Resp: 18    Height: 6' (1.829 m)    Weight: 220 lb (99.791 kg)    SpO2: 97% 96% 97%    MDM  Patient recently discharged on 6/10 for acute colitis with perforation. Colonoscopy later on 7/22 shows Circumferential diffuse abnormal mucosa was found in the sigmoid colon; The mucosa was edematous and congested Vitals stable - WNL -afebrile Pt resting  comfortably in ED. PE--diffuse abdominal tenderness worse in right lower quadrant and suprapubically. Labwork-labs are grossly noncontributory. Normal LFTs and alkaline phosphatase, lipase, white count. Imaging-pending CT abdomen with contrast.  Patient care signed out at shift change to Harvest Forest, MD, for follow-up on CT abdomen imaging, reassessment of patient and subsequent disposition. I feel if there are no new objective findings, patient is stable to be discharged home to follow up with PCP/GI for further evaluation and management of symptoms.  Final diagnoses:  None        Comer Locket, PA-C 04/29/15 1615  Charlesetta Shanks, MD 05/10/15 1435

## 2015-04-29 NOTE — ED Notes (Signed)
Patient transported to Ultrasound 

## 2015-10-14 ENCOUNTER — Encounter: Payer: Self-pay | Admitting: Emergency Medicine

## 2015-10-14 ENCOUNTER — Emergency Department: Payer: Medicaid Other

## 2015-10-14 ENCOUNTER — Emergency Department
Admission: EM | Admit: 2015-10-14 | Discharge: 2015-10-14 | Disposition: A | Discharge status: Home / Self Care | Payer: Medicaid Other | Attending: Emergency Medicine | Admitting: Emergency Medicine

## 2015-10-14 DIAGNOSIS — R109 Unspecified abdominal pain: Secondary | ICD-10-CM | POA: Diagnosis present

## 2015-10-14 DIAGNOSIS — F17210 Nicotine dependence, cigarettes, uncomplicated: Secondary | ICD-10-CM | POA: Diagnosis not present

## 2015-10-14 DIAGNOSIS — Z79899 Other long term (current) drug therapy: Secondary | ICD-10-CM | POA: Insufficient documentation

## 2015-10-14 LAB — COMPREHENSIVE METABOLIC PANEL
ALBUMIN: 4.7 g/dL (ref 3.5–5.0)
ALK PHOS: 55 U/L (ref 38–126)
ALT: 22 U/L (ref 17–63)
AST: 21 U/L (ref 15–41)
Anion gap: 4 — ABNORMAL LOW (ref 5–15)
BUN: 16 mg/dL (ref 6–20)
CALCIUM: 9.6 mg/dL (ref 8.9–10.3)
CO2: 23 mmol/L (ref 22–32)
Chloride: 109 mmol/L (ref 101–111)
Creatinine, Ser: 0.98 mg/dL (ref 0.61–1.24)
GFR calc Af Amer: >60 mL/min (ref >60)
GFR calc non Af Amer: >60 mL/min (ref >60)
GLUCOSE: 106 mg/dL — AB (ref 65–99)
Potassium: 3.5 mmol/L (ref 3.5–5.1)
SODIUM: 136 mmol/L (ref 135–145)
Total Bilirubin: 1.1 mg/dL (ref 0.3–1.2)
Total Protein: 8.2 g/dL — ABNORMAL HIGH (ref 6.5–8.1)

## 2015-10-14 LAB — LIPASE, BLOOD: Lipase: 23 U/L (ref 11–51)

## 2015-10-14 LAB — URINALYSIS COMPLETE WITH MICROSCOPIC (ARMC ONLY)
Bacteria, UA: NONE SEEN
Bilirubin Urine: NEGATIVE
Glucose, UA: NEGATIVE mg/dL
Hgb urine dipstick: NEGATIVE
KETONES UR: NEGATIVE mg/dL
Leukocytes, UA: NEGATIVE
NITRITE: NEGATIVE
PH: 9 — AB (ref 5.0–8.0)
Protein, ur: 30 mg/dL — AB
SPECIFIC GRAVITY, URINE: 1.019 (ref 1.005–1.030)
Squamous Epithelial / LPF: NONE SEEN

## 2015-10-14 LAB — CBC
HEMATOCRIT: 49.8 % (ref 40.0–52.0)
Hemoglobin: 16.9 g/dL (ref 13.0–18.0)
MCH: 28.8 pg (ref 26.0–34.0)
MCHC: 33.9 g/dL (ref 32.0–36.0)
MCV: 85 fL (ref 80.0–100.0)
PLATELETS: 230 10*3/uL (ref 150–440)
RBC: 5.86 MIL/uL (ref 4.40–5.90)
RDW: 13.8 % (ref 11.5–14.5)
WBC: 6.7 10*3/uL (ref 3.8–10.6)

## 2015-10-14 IMAGING — CT CT ABD-PELV W/ CM
2 of 5 series · 15 of 46 positions shown, 17 images · IV contrast (omnipaque)
Comparison: [DATE] and [DATE], as well as an earlier
study [DATE]

CLINICAL DATA: Right lower quadrant pain radiating into right groin
region for 2 days. History of diverticulitis. Nausea peer

EXAM:
CT ABDOMEN AND PELVIS WITH CONTRAST
TECHNIQUE: Multidetector CT imaging of the abdomen and pelvis was performed
using the standard protocol following bolus administration of
intravenous contrast. Oral contrast was also administered.
CONTRAST:  100mL OMNIPAQUE IOHEXOL 350 MG/ML SOLN

[Series 2: routine abd pel with · axial · 0.76mm/px · z∈[-706,-270]mm · 12 of 99 slices shown, 14 images]
[im 6/99  soft-tissue]
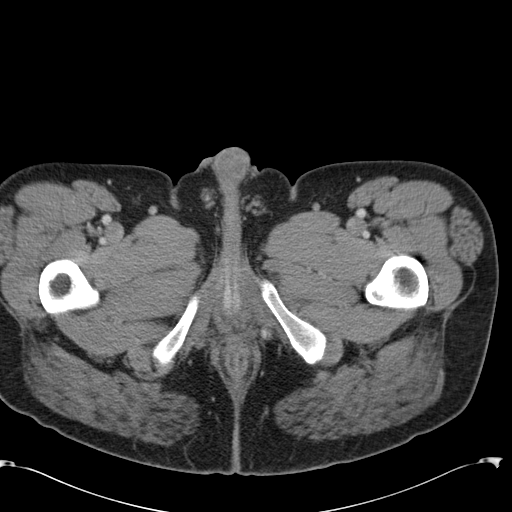
[im 6/99  bone]
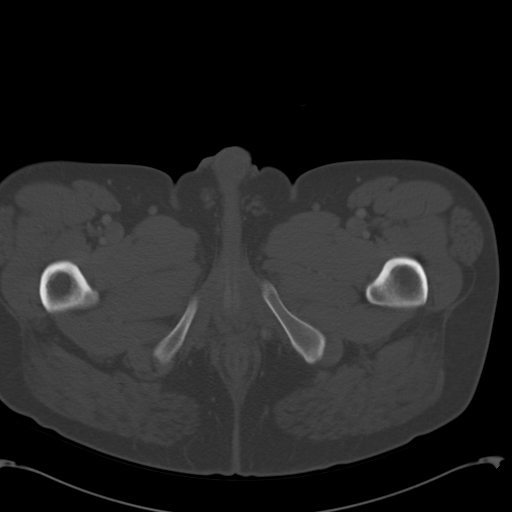
[im 16/99  soft-tissue]
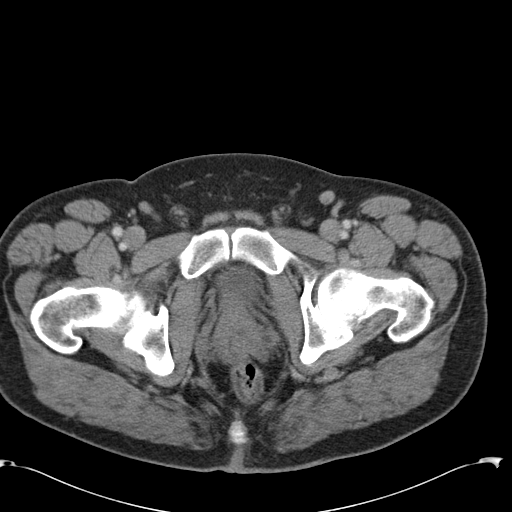
[im 21/99  soft-tissue]
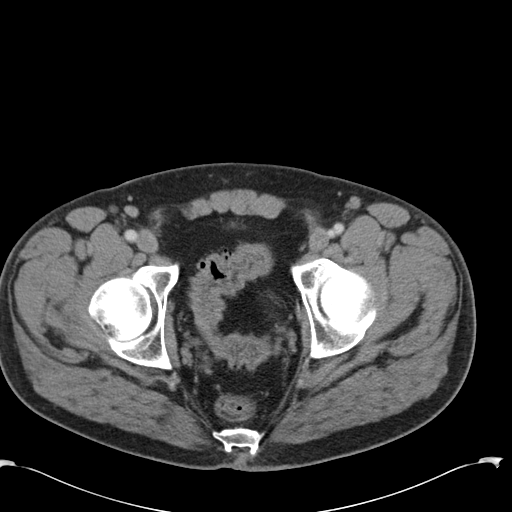
[im 31/99  soft-tissue]
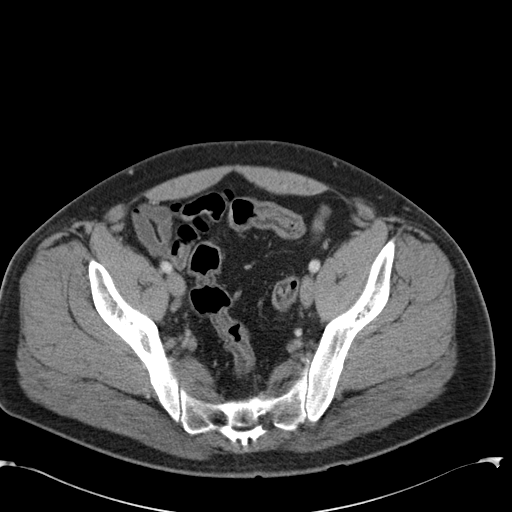
[im 37/99  soft-tissue]
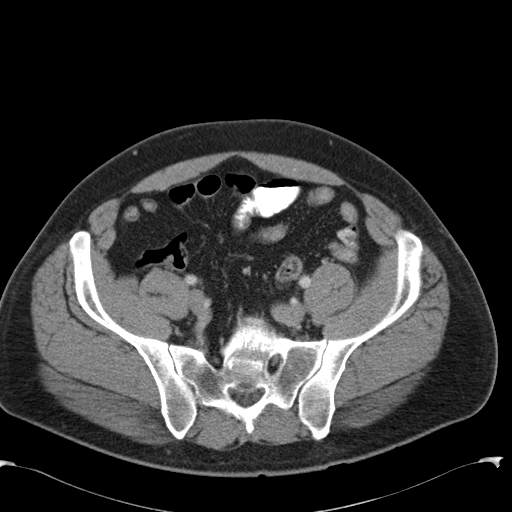
[im 47/99  soft-tissue]
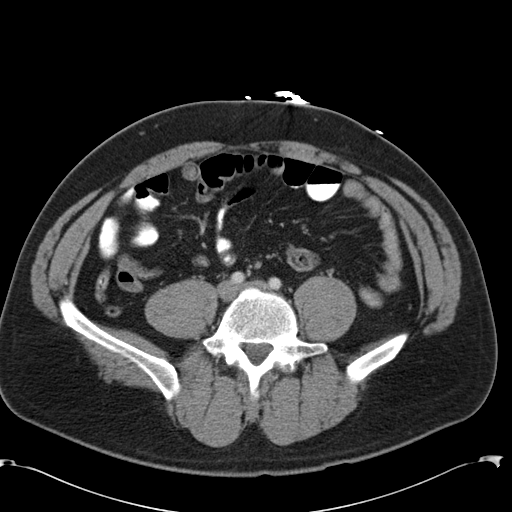
[im 52/99  soft-tissue]
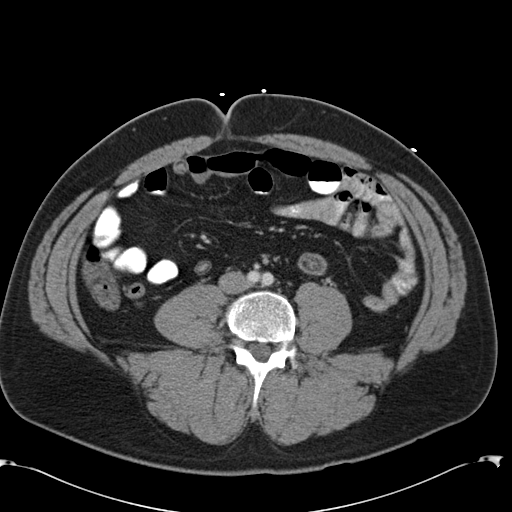
[im 62/99  soft-tissue]
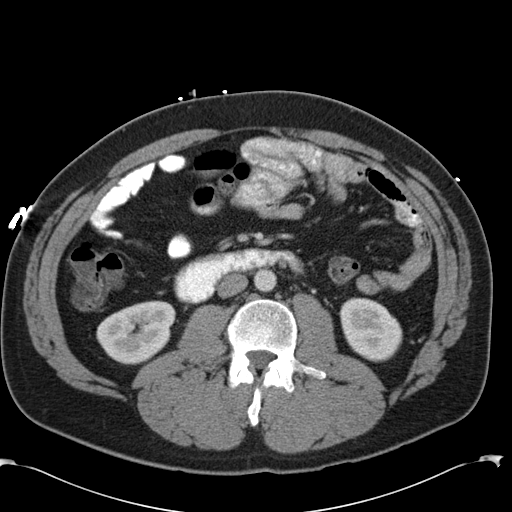
[im 68/99  soft-tissue]
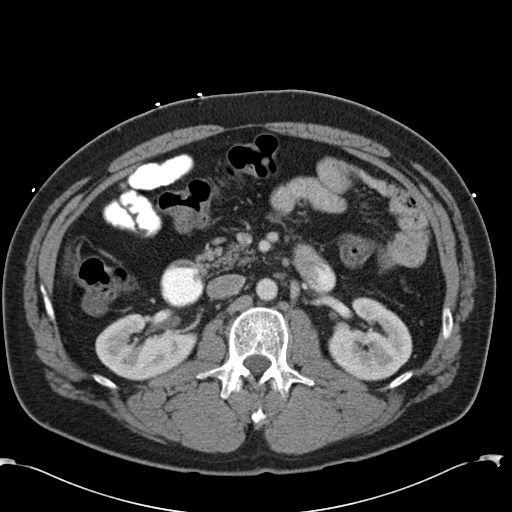
[im 68/99  bone]
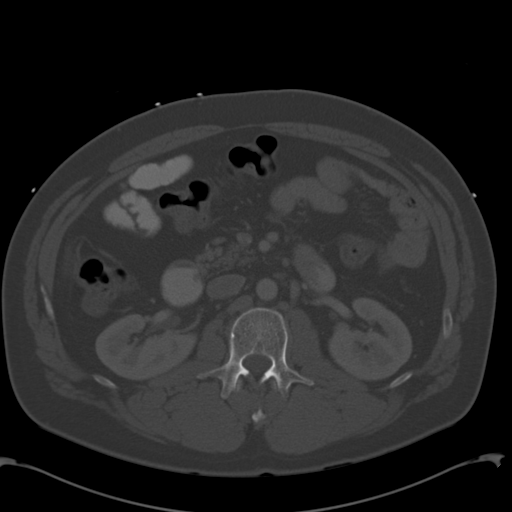
[im 78/99  soft-tissue]
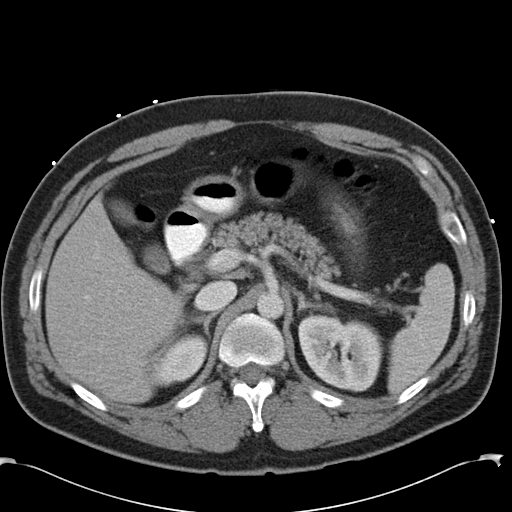
[im 83/99  soft-tissue]
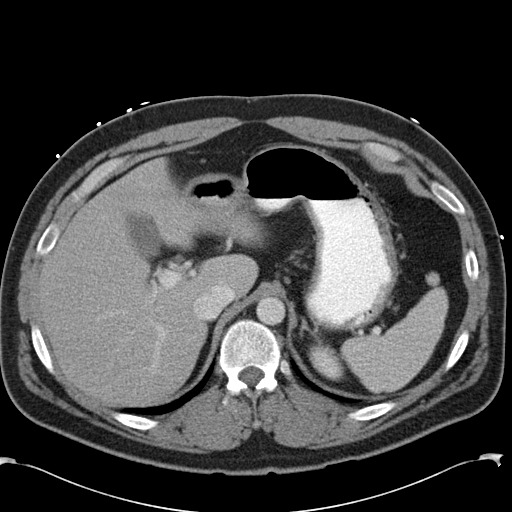
[im 93/99  soft-tissue]
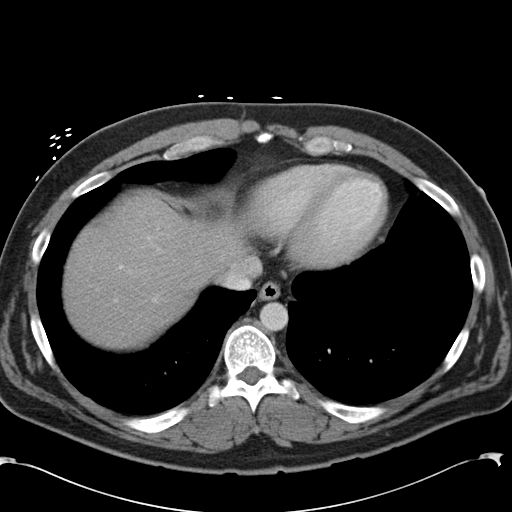

[Series 5: cor routine abd pel with · coronal · 0.78mm/px · 3 of 156 slices shown]
[im 52/156  soft-tissue]
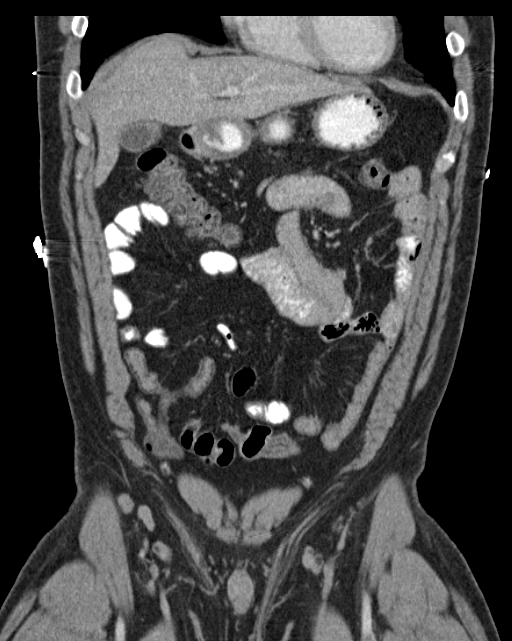
[im 69/156  soft-tissue]
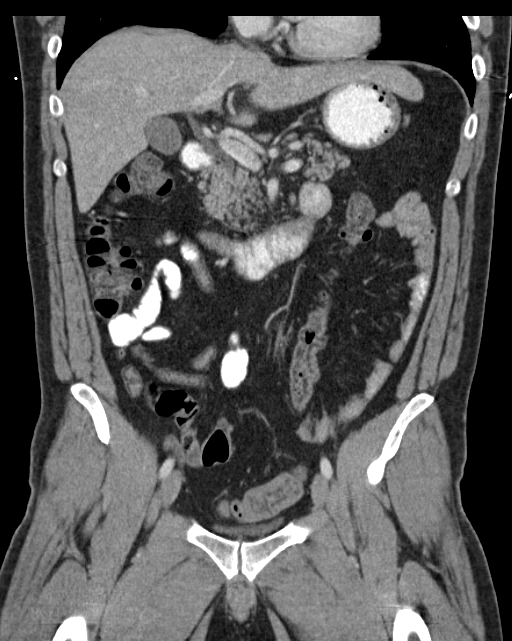
[im 87/156  soft-tissue]
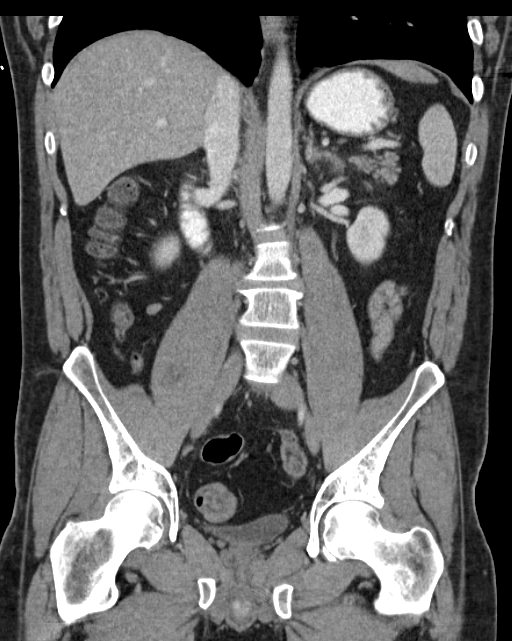

[15 of 46 positions shown; findings below may reference images not displayed]

FINDINGS: Lower chest:  Lung bases are clear.

Hepatobiliary: No focal liver lesions are identified. Gallbladder
wall is not thickened. There is no biliary duct dilatation.

Pancreas: There is no pancreatic mass or inflammatory focus.

Spleen: No splenic lesions are identified. There are tiny splenules
adjacent to the spleen.

Adrenals/Urinary Tract: Adrenals appear normal bilaterally. There is
no renal mass or hydronephrosis on either side. There is no renal or
ureteral calculus on either side. Urinary bladder is midline with
wall thickness within normal limits.

Stomach/Bowel: There is wall thickening in the sigmoid colon with
multiple diverticula. There is no surrounding mesenteric thickening
or evidence of abscess or perforation in this area. The patient has
had previous diverticulitis in this region. The appearance currently
is most suggestive of muscular hypertrophy with diverticulosis as
opposed to frank diverticulitis. There is no other bowel wall
thickening elsewhere. On axial images, there is an apparent filling
defect in the second portion of the duodenum which on coronal and
sagittal reformats appears due to an acute turn in the wall as
opposed to an actual mass lesion. The wall of the bowel does not
appear appreciably thickened in this area. There is no bowel
obstruction. No free air or portal venous air.

Vascular/Lymphatic: There is no abdominal aortic aneurysm. No
vascular lesions are appreciable. The major mesenteric vessels
appear patent. No adenopathy is appreciable in the abdomen or
pelvis. There are small inguinal lymph nodes which appear stable and
likely of reactive type etiology.

Reproductive: Prostate and seminal vesicles appear normal. There is
no pelvic mass or pelvic fluid collection.

Other: The appendix appears normal. No abscess or ascites in the
abdomen or pelvis.

Musculoskeletal: There is a stable sclerotic focus in the
intertrochanteric region of the left proximal femur measuring 3.0 x
3.0 cm. No other focal bone lesions appreciable. No intramuscular or
abdominal wall lesion.
IMPRESSION: Findings felt to represent muscular hypertrophy from diverticulosis
in the sigmoid colon. No frank diverticulitis.

Localized defect in the contrast in the second portion of the
duodenum which on axial images appear somewhat masslike but on
coronal and sagittal images appear secondary to an acute turn in the
wall. This area is not felt to have pathologic significance based on
the coronal and sagittal reformatted images.

No bowel obstruction.  No abscess.  Appendix appears normal.

No renal or ureteral calculus.  No hydronephrosis.

Stable benign-appearing 3 x 3 cm sclerotic lesion in the
intertrochanteric region of the proximal left femur.

## 2015-10-14 MED ORDER — SODIUM CHLORIDE 0.9 % IV BOLUS (SEPSIS)
1000.0000 mL | Freq: Once | INTRAVENOUS | Status: AC
  Administered 2015-10-14: 1000 mL via INTRAVENOUS

## 2015-10-14 MED ORDER — HYDROMORPHONE HCL 1 MG/ML IJ SOLN
INTRAMUSCULAR | Status: AC
  Administered 2015-10-14: 1 mg via INTRAVENOUS
  Filled 2015-10-14: qty 1

## 2015-10-14 MED ORDER — RANITIDINE HCL 150 MG PO CAPS: 150.0000 mg | ORAL_CAPSULE | Freq: Two times a day (BID) | ORAL | Status: DC

## 2015-10-14 MED ORDER — ONDANSETRON HCL 4 MG/2ML IJ SOLN
4.0000 mg | Freq: Once | INTRAMUSCULAR | Status: DC | PRN
Start: 2015-10-14 — End: 2015-10-14
  Filled 2015-10-14: qty 2

## 2015-10-14 MED ORDER — FENTANYL CITRATE (PF) 100 MCG/2ML IJ SOLN: 50.0000 ug | Freq: Once | INTRAMUSCULAR | Status: DC

## 2015-10-14 MED ORDER — IOHEXOL 350 MG/ML SOLN
100.0000 mL | Freq: Once | INTRAVENOUS | Status: AC | PRN
  Administered 2015-10-14: 100 mL via INTRAVENOUS

## 2015-10-14 MED ORDER — GI COCKTAIL ~~LOC~~
30.0000 mL | ORAL | Status: AC
  Administered 2015-10-14: 30 mL via ORAL
  Filled 2015-10-14: qty 30

## 2015-10-14 MED ORDER — HYDROMORPHONE HCL 1 MG/ML IJ SOLN
1.0000 mg | Freq: Once | INTRAMUSCULAR | Status: AC
  Administered 2015-10-14: 1 mg via INTRAVENOUS

## 2015-10-14 MED ORDER — ONDANSETRON HCL 4 MG/2ML IJ SOLN
4.0000 mg | Freq: Once | INTRAMUSCULAR | Status: AC
  Administered 2015-10-14: 4 mg via INTRAVENOUS

## 2015-10-14 MED ORDER — SUCRALFATE 1 G PO TABS: 1.0000 g | ORAL_TABLET | Freq: Four times a day (QID) | ORAL | Status: DC

## 2015-10-14 MED ORDER — ONDANSETRON 8 MG PO TBDP: 8.0000 mg | ORAL_TABLET | Freq: Three times a day (TID) | ORAL | Status: DC | PRN

## 2015-10-14 MED ORDER — FAMOTIDINE 20 MG PO TABS
40.0000 mg | ORAL_TABLET | Freq: Once | ORAL | Status: AC
  Administered 2015-10-14: 40 mg via ORAL
  Filled 2015-10-14: qty 2

## 2015-10-14 MED ORDER — IOHEXOL 240 MG/ML SOLN
25.0000 mL | Freq: Once | INTRAMUSCULAR | Status: AC | PRN
  Administered 2015-10-14: 25 mL via ORAL

## 2015-10-14 NOTE — ED Notes (Signed)
Patient complaining of right sided abd. Pain  With numbness or right abd. Pain began 2 days ago, worse this AM.  History of perforated colon in July of 2016. History of diverticulits. Patient states it feels like he's trying to "give birth".  Nausea with dry heaves.

## 2015-10-14 NOTE — ED Provider Notes (Signed)
[facility] Emergency Department Provider Note  ____________________________________________  Time seen: 8:15 AM  I have reviewed the triage vital signs and the nursing notes.   HISTORY  Chief Complaint Abdominal Pain    HPI Eric Peck is a 40 y.o. male who complains of right-sided abdominal pain that began today 2 days ago, constant waxing and waning and worsening. Feels crampy. Reports a history of diverticulitis with perforation that was medically managed back in July. Denies any chest pain shortness of breath fever or vomiting but does complain of nausea. No diarrhea. Bowel movements have been regular.Pain is severe and nonradiating without any aggravating or alleviating factors   Past Medical History  Diagnosis Date  . CTS (carpal tunnel syndrome)   . Arthritis   . Colitis      Patient Active Problem List   Diagnosis Date Noted  . Acute colitis 03/12/2015     Past Surgical History  Procedure Laterality Date  . Vasectomy    . Colonoscopy N/A 04/23/2015    Procedure: DIAGNOSTIC COLONOSCOPY;  Surgeon: [name], MD;  Location: [facility] ENDOSCOPY;  Service: Endoscopy;  Laterality: N/A;     Current Outpatient Rx  Name  Route  Sig  Dispense  Refill  . acetaminophen (TYLENOL) 325 MG tablet   Oral   Take 1-2 tablets (325-650 mg total) by mouth every 6 (six) hours as needed for fever, headache, mild pain or moderate pain. Patient not taking: Reported on 04/29/2015         . docusate sodium (COLACE) 100 MG capsule   Oral   Take 1 capsule (100 mg total) by mouth 2 (two) times daily as needed for mild constipation. Patient taking differently: Take 100 mg by mouth 2 (two) times daily as needed for mild constipation or moderate constipation.    10 capsule   0   . HYDROcodone-acetaminophen (NORCO/VICODIN) 5-325 MG per tablet   Oral   Take 1-2 tablets by mouth every 4 (four) hours as needed.   6 tablet   0   . ibuprofen (ADVIL,MOTRIN) 200  MG tablet   Oral   Take 600 mg by mouth every 8 (eight) hours as needed for headache, mild pain or moderate pain.          Marland Kitchen ondansetron (ZOFRAN ODT) 8 MG disintegrating tablet   Oral   Take 1 tablet (8 mg total) by mouth every 8 (eight) hours as needed for nausea or vomiting.   20 tablet   0   . ondansetron (ZOFRAN) 4 MG tablet   Oral   Take 1 tablet (4 mg total) by mouth every 6 (six) hours.   8 tablet   0   . polyethylene glycol (MIRALAX / GLYCOLAX) packet   Oral   Take 17 g by mouth daily as needed. Patient taking differently: Take 17 g by mouth daily as needed for mild constipation or moderate constipation.    14 each   0   . ranitidine (ZANTAC) 150 MG capsule   Oral   Take 1 capsule (150 mg total) by mouth 2 (two) times daily.   28 capsule   0   . sucralfate (CARAFATE) 1 g tablet   Oral   Take 1 tablet (1 g total) by mouth 4 (four) times daily.   120 tablet   1      Allergies Review of patient's allergies indicates no known allergies.   Family History  Problem Relation Age of Onset  . COPD Mother   .  Drug abuse Mother     EtOH  . Diabetes Father   . Kidney disease Father   . Eczema Brother   . Mental retardation Brother     Down's Syndrome    Social History Social History  Substance Use Topics  . Smoking status: Current Every Day Smoker -- 1.00 packs/day    Types: Cigarettes  . Smokeless tobacco: Never Used  . Alcohol Use: No    Review of Systems  Constitutional:   No fever or chills. No weight changes Eyes:   No blurry vision or double vision.  ENT:   No sore throat. Cardiovascular:   No chest pain. Respiratory:   No dyspnea or cough. Gastrointestinal:   Positive abdominal pain as above without vomiting or diarrhea.  No BRBPR or melena. Genitourinary:   Negative for dysuria, urinary retention, bloody urine, or difficulty urinating. Musculoskeletal:   Negative for back pain. No joint swelling or pain. Skin:   Negative for  rash. Neurological:   Negative for headaches, focal weakness or numbness. Psychiatric:  No anxiety or depression.   Endocrine:  No hot/cold intolerance, changes in energy, or sleep difficulty.  10-point ROS otherwise negative.  ____________________________________________   PHYSICAL EXAM:  VITAL SIGNS: ED Triage Vitals  Enc Vitals Group     BP 10/14/15 0811 130/78 mmHg     Pulse Rate 10/14/15 0811 75     Resp 10/14/15 0833 25     Temp 10/14/15 0811 98.7 F (37.1 C)     Temp Source 10/14/15 0811 Oral     SpO2 10/14/15 0811 100 %     Weight 10/14/15 0812 225 lb (102.059 kg)     Height 10/14/15 0812 5\' 10"  (1.778 m)     Head Cir --      Peak Flow --      Pain Score 10/14/15 0812 8     Pain Loc --      Pain Edu? --      Excl. in [place]? --     Vital signs reviewed, nursing assessments reviewed.   Constitutional:   Alert and orimild distress due to painyes:   No scleral icterus. No conjunctival pallor. PERRL. EOMI ENT   Head:   Normocephalic and atraumatic.   Nose:   No congestion/rhinnorhea. No septal hematoma   Mouth/Throat:   MMM, no pharyngeal erythema. No peritonsillar mass. No uvula shift.   Neck:   No stridor. No SubQ emphysema. No meningismus. Hematological/Lymphatic/Immunilogical:   No cervical lymphadenopathy. Cardiovascular:   RRR. Normal and symmetric distal pulses are present in all extremities. No murmurs, rubs, or gallops. Respiratory:   Normal respiratory effort without tachypnea nor retractions. Breath sounds are clear and equal bilaterally. No wheezes/rales/rhonchi. Gastrointestinal:   SofWith diffuse right sided tenderness.o distention. There is no CVA tenderness.  No rebound, rigidity, or guarding. Genitourinary:   deferred Musculoskeletal:   Nontender with normal range of motion in all extremities. No joint effusions.  No lower extremity tenderness.  No edema. Neurologic:   Normal speech and language.  CN 2-10 normal. Motor grossly  intact. No pronator drift.  Normal gait. No gross focal neurologic deficits are appreciated.  Skin:    Skin is warm, dry and intact. No rash noted.  No petechiae, purpura, or bullae. Psychiatric:   Mood and affect are normal. Speech and behavior are normal. Patient exhibits appropriate insight and judgment.  ____________________________________________    LABS (pertinent positives/negatives) (all labs ordered are listed, but only abnormal results are displayed) Labs  Reviewed  COMPREHENSIVE METABOLIC PANEL - Abnormal; Notable for the following:    Glucose, Bld 106 (*)    Total Protein 8.2 (*)    Anion gap 4 (*)    All other components within normal limits  URINALYSIS COMPLETEWITH MICROSCOPIC ([facility] ONLY) - Abnormal; Notable for the following:    Color, Urine YELLOW (*)    APPearance CLEAR (*)    pH 9.0 (*)    Protein, ur 30 (*)    All other components within normal limits  LIPASE, BLOOD  CBC   ____________________________________________   EKGInterpreted by me  Date: 10/14/2015  Rate: 63  Rhythm: normal sinus rhythm  QRS Axis: normal  Intervals: normal  ST/T Wave abnormalities: normal  Conduction Disutrbances: none  Narrative Interpretation: unremarkable      ____________________________________________    Radiology  CT abdomen and pelvis essentially unremarkable  ____________________________________________   PROCEDURES   ____________________________________________   INITIAL IMPRESSION / ASSESSMENT AND PLAN / ED COURSE  Pertinent labs & imaging results that were available during my care of the patient were reviewed by me and considered in my medical decision making (see chart for details).  Patient presents with severe abdominal pain in the setting of history of complicated diverticulitis. We'll give IV fluids along with Zofran while getting labs and a CT.  ----------------------------------------- 10:41 AM on  10/14/2015 -----------------------------------------  Patient calm and comfortable, well-appearing no distress. Vital signs remain stable and normal. Workup including urinalysis, blood work, and CT is unremarkable. We'll discharge the patient home with antacids and anti-medics and have him follow up with primary care. He has previously been evaluated by gastroenterology and he should continue their management.     ____________________________________________   FINAL CLINICAL IMPRESSION(S) / ED DIAGNOSES  Final diagnoses:  Right-sided abdominal pain of unknown cause      Carrie Mew, MD 10/14/15 1042

## 2015-10-14 NOTE — Discharge Instructions (Signed)

## 2015-10-14 NOTE — ED Notes (Signed)
Patient tolerating gingerale and crackers.  States that GI cocktail has helped tremendously.

## 2015-12-21 ENCOUNTER — Emergency Department (HOSPITAL_COMMUNITY): Payer: Medicaid Other

## 2015-12-21 ENCOUNTER — Emergency Department (HOSPITAL_COMMUNITY)
Admission: EM | Admit: 2015-12-21 | Discharge: 2015-12-21 | Disposition: A | Discharge status: Home / Self Care | Payer: Medicaid Other | Attending: Emergency Medicine | Admitting: Emergency Medicine

## 2015-12-21 ENCOUNTER — Encounter (HOSPITAL_COMMUNITY): Payer: Self-pay | Admitting: Emergency Medicine

## 2015-12-21 DIAGNOSIS — Z79899 Other long term (current) drug therapy: Secondary | ICD-10-CM | POA: Insufficient documentation

## 2015-12-21 DIAGNOSIS — K5730 Diverticulosis of large intestine without perforation or abscess without bleeding: Secondary | ICD-10-CM | POA: Diagnosis not present

## 2015-12-21 DIAGNOSIS — Z8669 Personal history of other diseases of the nervous system and sense organs: Secondary | ICD-10-CM | POA: Insufficient documentation

## 2015-12-21 DIAGNOSIS — M1990 Unspecified osteoarthritis, unspecified site: Secondary | ICD-10-CM | POA: Insufficient documentation

## 2015-12-21 DIAGNOSIS — F17210 Nicotine dependence, cigarettes, uncomplicated: Secondary | ICD-10-CM | POA: Insufficient documentation

## 2015-12-21 DIAGNOSIS — R062 Wheezing: Secondary | ICD-10-CM | POA: Diagnosis not present

## 2015-12-21 DIAGNOSIS — R1031 Right lower quadrant pain: Secondary | ICD-10-CM | POA: Diagnosis present

## 2015-12-21 DIAGNOSIS — E669 Obesity, unspecified: Secondary | ICD-10-CM | POA: Insufficient documentation

## 2015-12-21 DIAGNOSIS — R1084 Generalized abdominal pain: Secondary | ICD-10-CM

## 2015-12-21 LAB — CBC
HEMATOCRIT: 44.7 % (ref 39.0–52.0)
HEMOGLOBIN: 15.7 g/dL (ref 13.0–17.0)
MCH: 29.4 pg (ref 26.0–34.0)
MCHC: 35.1 g/dL (ref 30.0–36.0)
MCV: 83.7 fL (ref 78.0–100.0)
Platelets: 262 10*3/uL (ref 150–400)
RBC: 5.34 MIL/uL (ref 4.22–5.81)
RDW: 13 % (ref 11.5–15.5)
WBC: 6.7 10*3/uL (ref 4.0–10.5)

## 2015-12-21 LAB — COMPREHENSIVE METABOLIC PANEL
ALBUMIN: 4.1 g/dL (ref 3.5–5.0)
ALK PHOS: 56 U/L (ref 38–126)
ALT: 17 U/L (ref 17–63)
ANION GAP: 13 (ref 5–15)
AST: 23 U/L (ref 15–41)
BILIRUBIN TOTAL: 0.8 mg/dL (ref 0.3–1.2)
BUN: 9 mg/dL (ref 6–20)
CALCIUM: 9.6 mg/dL (ref 8.9–10.3)
CO2: 21 mmol/L — AB (ref 22–32)
Chloride: 107 mmol/L (ref 101–111)
Creatinine, Ser: 0.99 mg/dL (ref 0.61–1.24)
GFR calc Af Amer: >60 mL/min (ref >60)
GLUCOSE: 97 mg/dL (ref 65–99)
Potassium: 3.4 mmol/L — ABNORMAL LOW (ref 3.5–5.1)
Sodium: 141 mmol/L (ref 135–145)
Total Protein: 6.8 g/dL (ref 6.5–8.1)

## 2015-12-21 LAB — LIPASE, BLOOD: Lipase: 36 U/L (ref 11–51)

## 2015-12-21 IMAGING — CT CT ABD-PELV W/ CM
2 of 4 series · 16 of 46 positions shown, 18 images · IV contrast (Omni 300)
Comparison: CT abdomen and pelvis [DATE] and [DATE].

CLINICAL DATA: Abdominal pain and nausea for 4-5 months. No known
injury. Subsequent encounter.

EXAM:
CT ABDOMEN AND PELVIS WITH CONTRAST
TECHNIQUE: Multidetector CT imaging of the abdomen and pelvis was performed
using the standard protocol following bolus administration of
intravenous contrast.
CONTRAST:  100 mL OMNIPAQUE IOHEXOL 300 MG/ML  SOLN

[Series 2: a/p w/ 5mm · axial · 0.91mm/px · z∈[-519,-39]mm · 13 of 106 slices shown, 15 images]
[im 5/106  soft-tissue]
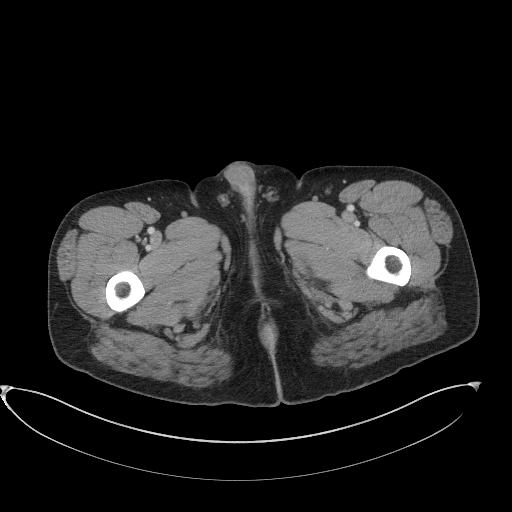
[im 5/106  bone]
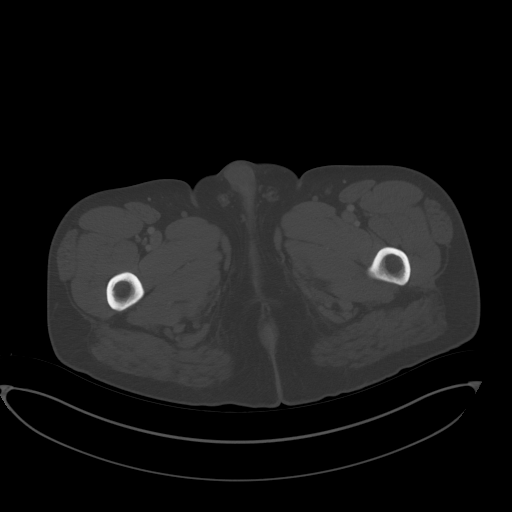
[im 15/106  soft-tissue]
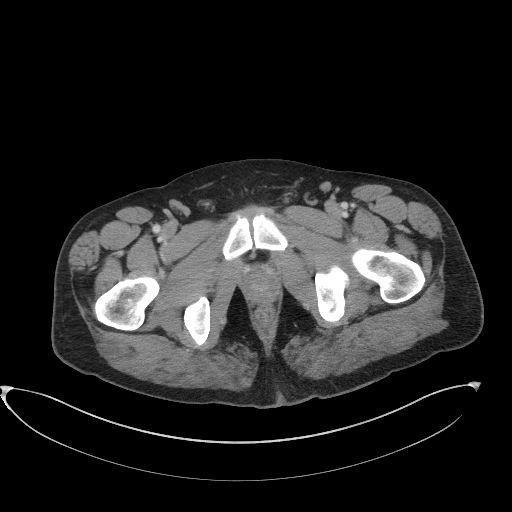
[im 24/106  soft-tissue]
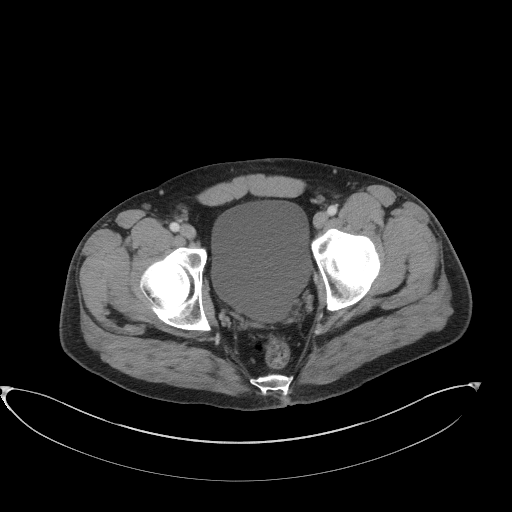
[im 29/106  soft-tissue]
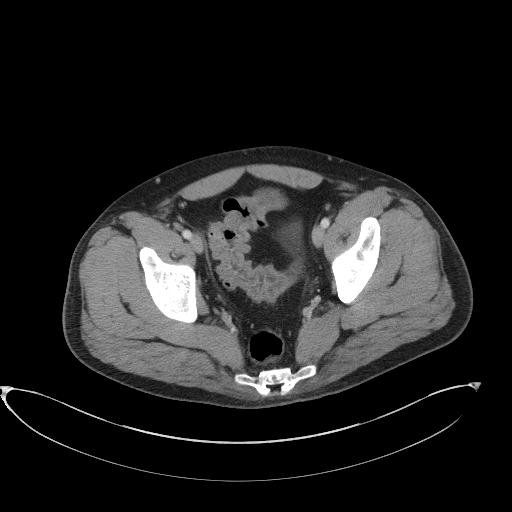
[im 39/106  soft-tissue]
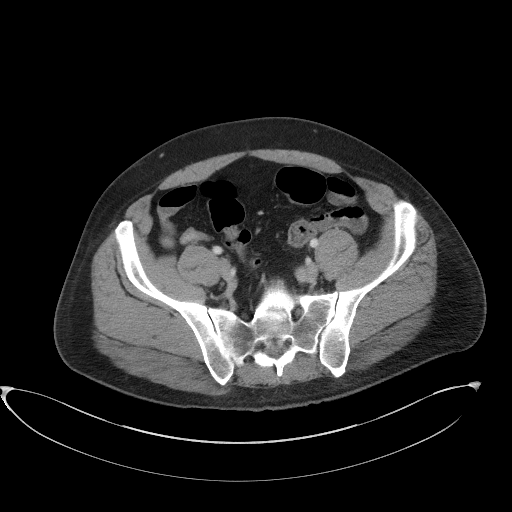
[im 43/106  soft-tissue]
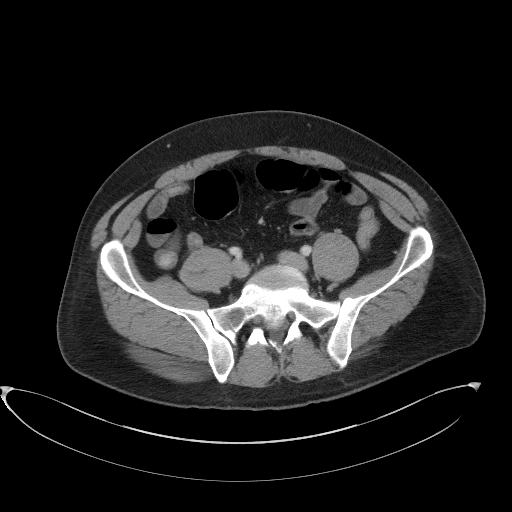
[im 53/106  soft-tissue]
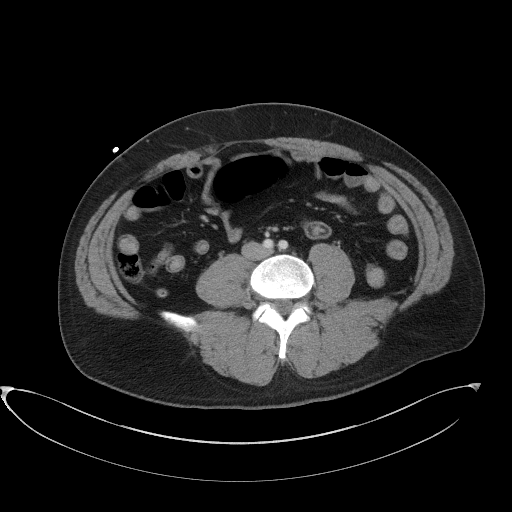
[im 63/106  soft-tissue]
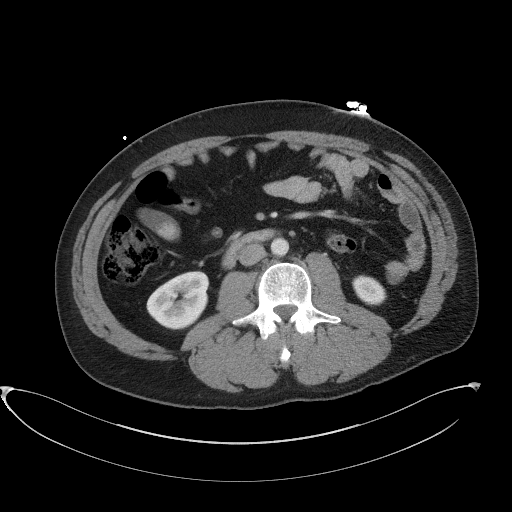
[im 67/106  soft-tissue]
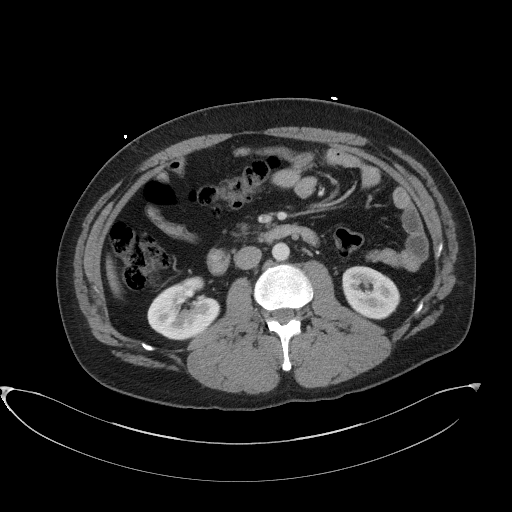
[im 67/106  bone]
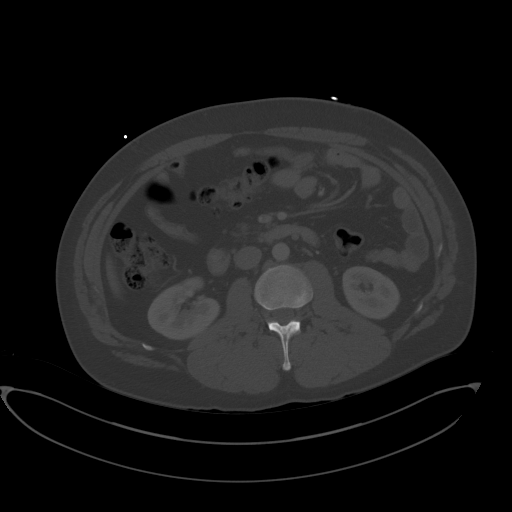
[im 77/106  soft-tissue]
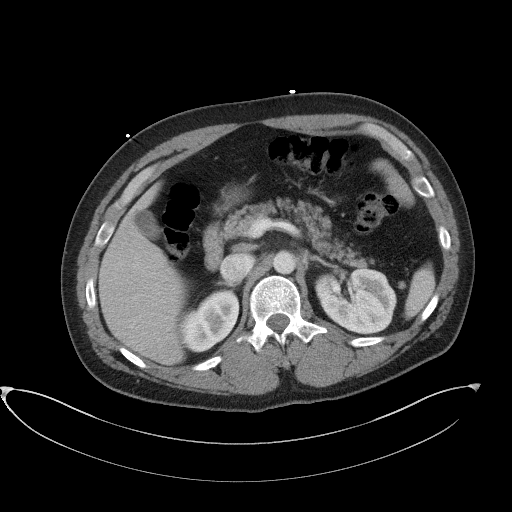
[im 82/106  soft-tissue]
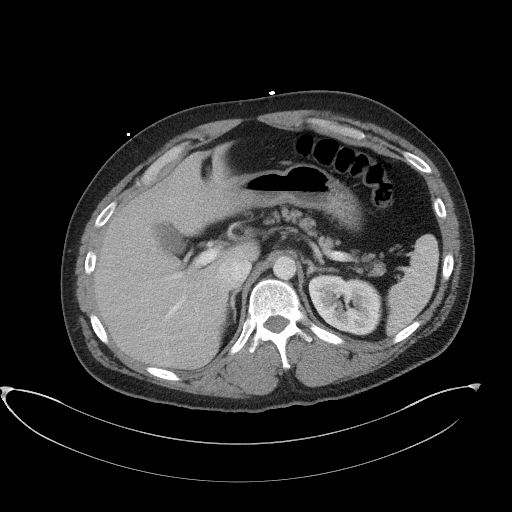
[im 91/106  soft-tissue]
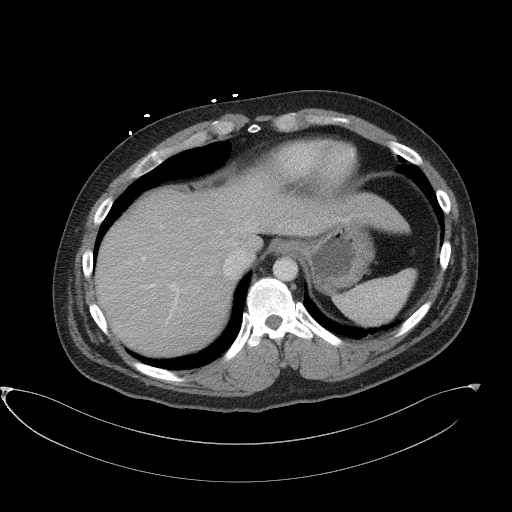
[im 101/106  soft-tissue]
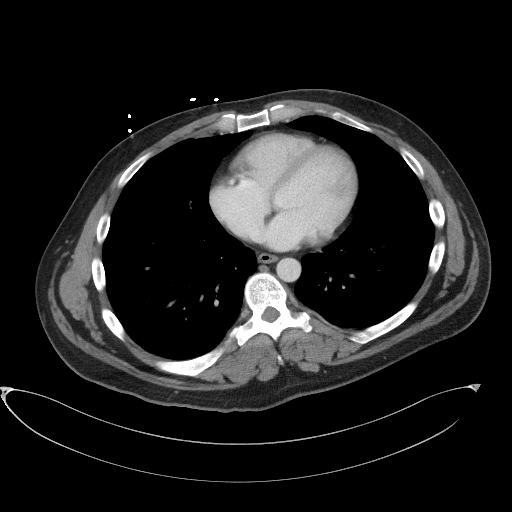

[Series 5: a/p w/ cor · coronal · 0.80mm/px · 3 of 151 slices shown]
[im 51/151  soft-tissue]
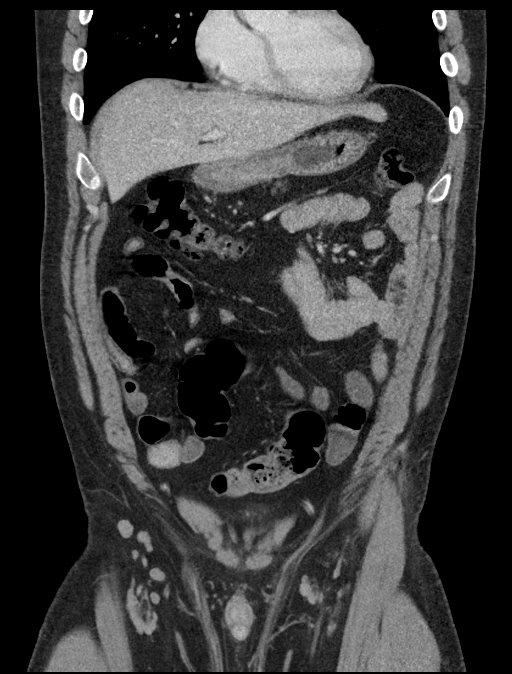
[im 67/151  soft-tissue]
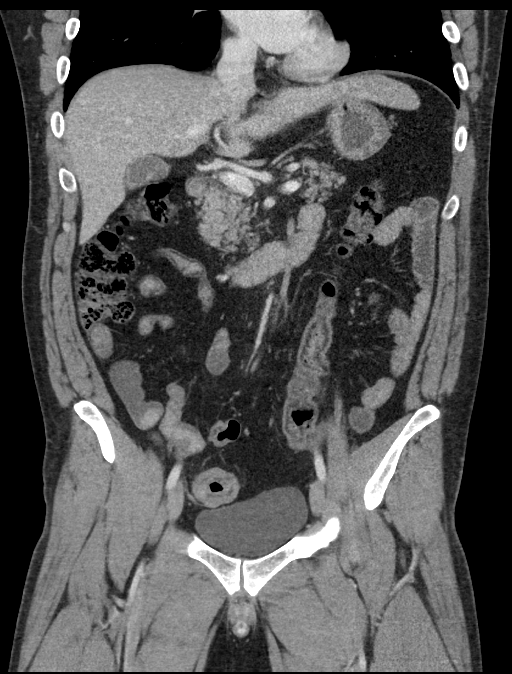
[im 84/151  soft-tissue]
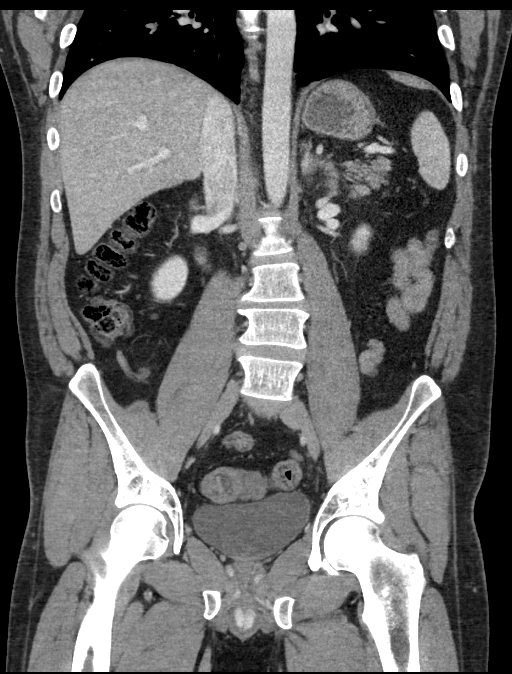

[16 of 46 positions shown; findings below may reference images not displayed]

FINDINGS: Heart size is normal. No pleural or pericardial effusion. Lung bases
are clear.

The gallbladder, liver, spleen, kidneys, adrenal glands, pancreas
and biliary tree are unremarkable. There is some sigmoid
diverticulosis without diverticulitis. The colon is otherwise
unremarkable. The stomach, appendix and small bowel appear normal.
No lymphadenopathy or fluid is identified. Urinary bladder, prostate
gland and seminal vesicles appear normal.

No focal bony abnormality is identified.
IMPRESSION: Diverticulosis without diverticulitis. The exam is otherwise
negative.

## 2015-12-21 MED ORDER — DICYCLOMINE HCL 10 MG PO CAPS
10.0000 mg | ORAL_CAPSULE | Freq: Once | ORAL | Status: AC
  Administered 2015-12-21: 10 mg via ORAL
  Filled 2015-12-21: qty 1

## 2015-12-21 MED ORDER — SODIUM CHLORIDE 0.9 % IV SOLN: INTRAVENOUS | Status: DC

## 2015-12-21 MED ORDER — IOHEXOL 300 MG/ML  SOLN
100.0000 mL | Freq: Once | INTRAMUSCULAR | Status: AC | PRN
  Administered 2015-12-21: 100 mL via INTRAVENOUS

## 2015-12-21 MED ORDER — GI COCKTAIL ~~LOC~~
30.0000 mL | Freq: Once | ORAL | Status: AC
  Administered 2015-12-21: 30 mL via ORAL
  Filled 2015-12-21: qty 30

## 2015-12-21 MED ORDER — HYDROMORPHONE HCL 1 MG/ML IJ SOLN
1.0000 mg | Freq: Once | INTRAMUSCULAR | Status: AC
Start: 2015-12-21 — End: 2015-12-21
  Administered 2015-12-21: 1 mg via INTRAVENOUS
  Filled 2015-12-21: qty 1

## 2015-12-21 MED ORDER — ONDANSETRON 4 MG PO TBDP: 4.0000 mg | ORAL_TABLET | ORAL | Status: DC | PRN

## 2015-12-21 MED ORDER — DICYCLOMINE HCL 20 MG PO TABS: 20.0000 mg | ORAL_TABLET | Freq: Two times a day (BID) | ORAL | Status: DC

## 2015-12-21 MED ORDER — PROMETHAZINE HCL 25 MG/ML IJ SOLN
25.0000 mg | Freq: Once | INTRAMUSCULAR | Status: AC
  Administered 2015-12-21: 25 mg via INTRAVENOUS
  Filled 2015-12-21: qty 1

## 2015-12-21 NOTE — Discharge Instructions (Signed)
Abdominal Pain, Adult Many things can cause abdominal pain. Usually, abdominal pain is not caused by a disease and will improve without treatment. It can often be observed and treated at home. Your health care provider will do a physical exam and possibly order blood tests and X-rays to help determine the seriousness of your pain. However, in many cases, more time must pass before a clear cause of the pain can be found. Before that point, your health care provider may not know if you need more testing or further treatment. HOME CARE INSTRUCTIONS Monitor your abdominal pain for any changes. The following actions may help to alleviate any discomfort you are experiencing:  Only take over-the-counter or prescription medicines as directed by your health care provider.  Do not take laxatives unless directed to do so by your health care provider.  Try a clear liquid diet (broth, tea, or water) as directed by your health care provider. Slowly move to a bland diet as tolerated. SEEK MEDICAL CARE IF:  You have unexplained abdominal pain.  You have abdominal pain associated with nausea or diarrhea.  You have pain when you urinate or have a bowel movement.  You experience abdominal pain that wakes you in the night.  You have abdominal pain that is worsened or improved by eating food.  You have abdominal pain that is worsened with eating fatty foods.  You have a fever. SEEK IMMEDIATE MEDICAL CARE IF:  Your pain does not go away within 2 hours.  You keep throwing up (vomiting).  Your pain is felt only in portions of the abdomen, such as the right side or the left lower portion of the abdomen.  You pass bloody or black tarry stools. MAKE SURE YOU:  Understand these instructions.  Will watch your condition.  Will get help right away if you are not doing well or get worse.   This information is not intended to replace advice given to you by your health care provider. Make sure you discuss  any questions you have with your health care provider.   Document Released: 06/28/2005 Document Revised: 06/09/2015 Document Reviewed: 05/28/2013 Elsevier Interactive Patient Education 2016 [employer].  Diverticulosis Diverticulosis is the condition that develops when small pouches (diverticula) form in the wall of your colon. Your colon, or large intestine, is where water is absorbed and stool is formed. The pouches form when the inside layer of your colon pushes through weak spots in the outer layers of your colon. CAUSES  No one knows exactly what causes diverticulosis. RISK FACTORS  Being older than 52. Your risk for this condition increases with age. Diverticulosis is rare in people younger than 40 years. By age 73, almost everyone has it.  Eating a low-fiber diet.  Being frequently constipated.  Being overweight.  Not getting enough exercise.  Smoking.  Taking over-the-counter pain medicines, like aspirin and ibuprofen. SYMPTOMS  Most people with diverticulosis do not have symptoms. DIAGNOSIS  Because diverticulosis often has no symptoms, health care providers often discover the condition during an exam for other colon problems. In many cases, a health care provider will diagnose diverticulosis while using a flexible scope to examine the colon (colonoscopy). TREATMENT  If you have never developed an infection related to diverticulosis, you may not need treatment. If you have had an infection before, treatment may include:  Eating more fruits, vegetables, and grains.  Taking a fiber supplement.  Taking a live bacteria supplement (probiotic).  Taking medicine to relax your colon. HOME CARE  INSTRUCTIONS   Drink at least 6-8 glasses of water each day to prevent constipation.  Try not to strain when you have a bowel movement.  Keep all follow-up appointments. If you have had an infection before:  Increase the fiber in your diet as directed by your health care  provider or dietitian.  Take a dietary fiber supplement if your health care provider approves.  Only take medicines as directed by your health care provider. SEEK MEDICAL CARE IF:   You have abdominal pain.  You have bloating.  You have cramps.  You have not gone to the bathroom in 3 days. SEEK IMMEDIATE MEDICAL CARE IF:   Your pain gets worse.  Yourbloating becomes very bad.  You have a fever or chills, and your symptoms suddenly get worse.  You begin vomiting.  You have bowel movements that are bloody or black. MAKE SURE YOU:  Understand these instructions.  Will watch your condition.  Will get help right away if you are not doing well or get worse.   This information is not intended to replace advice given to you by your health care provider. Make sure you discuss any questions you have with your health care provider.   Document Released: 06/15/2004 Document Revised: 09/23/2013 Document Reviewed: 08/13/2013 Elsevier Interactive Patient Education [employer].

## 2015-12-21 NOTE — ED Notes (Signed)
Per [facility], pt admitted a few months ago for bowel perferation and bacterial infection. Pt c/o pain ever since, but today it got worse. Pain 10/10, pt moaning in pain. VSS per ems. AAOX4.

## 2015-12-21 NOTE — ED Provider Notes (Signed)
CSN: [id number]     Arrival date & time 12/21/15  8 History   First MD Initiated Contact with Patient 12/21/15 1450     Chief Complaint  Patient presents with  . Abdominal Pain     (Consider location/radiation/quality/duration/timing/severity/associated sxs/prior Treatment) HPI Patient states he been having severe pain in his right lower quadrant. It became excruciating this morning. It's sharp. He reports that the pain does now radiate out to much of his abdomen. Patient had a history of a bowel per secondary to colitis last summer. That was treated conservatively. Patient reports he's had problems with pain ever since that episode. He states he is chronically nauseated and often has some baseline pain that sometimes gets somewhat worse. He reports that today however the severity increased significantly. He reports he has had weight loss since this first episode. He reports losing approximately 40 pounds. He reports his stool is often somewhat loose and he sees undigested food pass. Patient denies even having other medical problems. He reports he feels extremely nauseated but has not been vomiting. Past Medical History  Diagnosis Date  . CTS (carpal tunnel syndrome)   . Arthritis   . Colitis    Past Surgical History  Procedure Laterality Date  . Vasectomy    . Colonoscopy N/A 04/23/2015    Procedure: DIAGNOSTIC COLONOSCOPY;  Surgeon: [name], MD;  Location: [facility];  Service: Endoscopy;  Laterality: N/A;   Family History  Problem Relation Age of Onset  . COPD Mother   . Drug abuse Mother     EtOH  . Diabetes Father   . Kidney disease Father   . Eczema Brother   . Mental retardation Brother     Down's Syndrome   Social History  Substance Use Topics  . Smoking status: Current Every Day Smoker -- 1.00 packs/day    Types: Cigarettes  . Smokeless tobacco: Never Used  . Alcohol Use: No    Review of Systems  10 Systems reviewed and are negative for acute change  except as noted in the HPI.   Allergies  Review of patient's allergies indicates no known allergies.  Home Medications   Prior to Admission medications   Medication Sig Start Date End Date Taking? Authorizing Provider  alum & mag hydroxide-simeth (MAALOX MAX) 400-400-40 MG/5ML suspension Take 10 mLs by mouth every 6 (six) hours as needed for indigestion.   Yes Historical Provider, MD  bisacodyl (DULCOLAX) 5 MG EC tablet Take 5 mg by mouth daily as needed for mild constipation or moderate constipation.   Yes Historical Provider, MD  HYDROcodone-acetaminophen (NORCO/VICODIN) 5-325 MG per tablet Take 1-2 tablets by mouth every 4 (four) hours as needed. 04/29/15  Yes Geronimo Boot, MD  ibuprofen (ADVIL,MOTRIN) 200 MG tablet Take 600 mg by mouth every 8 (eight) hours as needed for headache, mild pain or moderate pain.    Yes Historical Provider, MD  ondansetron (ZOFRAN ODT) 8 MG disintegrating tablet Take 1 tablet (8 mg total) by mouth every 8 (eight) hours as needed for nausea or vomiting. 10/14/15  Yes Carrie Mew, MD  ondansetron (ZOFRAN) 4 MG tablet Take 1 tablet (4 mg total) by mouth every 6 (six) hours. 04/29/15  Yes Geronimo Boot, MD  ranitidine (ZANTAC) 150 MG capsule Take 1 capsule (150 mg total) by mouth 2 (two) times daily. 10/14/15  Yes Carrie Mew, MD  acetaminophen (TYLENOL) 325 MG tablet Take 1-2 tablets (325-650 mg total) by mouth every 6 (six) hours as needed for fever, headache,  mild pain or moderate pain. Patient not taking: Reported on 04/29/2015 03/17/15   Nat Christen, PA-C  dicyclomine (BENTYL) 20 MG tablet Take 1 tablet (20 mg total) by mouth 2 (two) times daily. 12/21/15   Charlesetta Shanks, MD  docusate sodium (COLACE) 100 MG capsule Take 1 capsule (100 mg total) by mouth 2 (two) times daily as needed for mild constipation. Patient taking differently: Take 100 mg by mouth 2 (two) times daily as needed for mild constipation or moderate constipation.  03/17/15   Nat Christen, PA-C  ondansetron (ZOFRAN ODT) 4 MG disintegrating tablet Take 1 tablet (4 mg total) by mouth every 4 (four) hours as needed for nausea or vomiting. 12/21/15   Charlesetta Shanks, MD  polyethylene glycol (MIRALAX / GLYCOLAX) packet Take 17 g by mouth daily as needed. Patient taking differently: Take 17 g by mouth daily as needed for mild constipation or moderate constipation.  03/17/15   Nat Christen, PA-C  sucralfate (CARAFATE) 1 g tablet Take 1 tablet (1 g total) by mouth 4 (four) times daily. 10/14/15   Carrie Mew, MD   BP 133/75 mmHg  Pulse 58  Temp(Src) 98.9 F (37.2 C) (Axillary)  Resp 16  SpO2 96% Physical Exam  Constitutional: He is oriented to person, place, and time.  Patient is mildly obese and deconditioned. He appears to be in severe pain. No respiratory distress. Patient is nontoxic.  HENT:  Head: Normocephalic and atraumatic.  Cardiovascular: Normal rate, regular rhythm, normal heart sounds and intact distal pulses.   Pulmonary/Chest: Effort normal. He has wheezes.  Occasional expiratory wheeze on the left.  Abdominal: Soft. There is tenderness.  Right lower quadrant tenderness.  Musculoskeletal: He exhibits no edema or tenderness.  Neurological: He is alert and oriented to person, place, and time. He exhibits normal muscle tone. Coordination normal.  Skin: Skin is warm and dry.    ED Course  Procedures (including critical care time) Labs Review Labs Reviewed  COMPREHENSIVE METABOLIC PANEL - Abnormal; Notable for the following:    Potassium 3.4 (*)    CO2 21 (*)    All other components within normal limits  LIPASE, BLOOD  CBC    Imaging Review Ct Abdomen Pelvis W Contrast  12/21/2015  CLINICAL DATA:  Abdominal pain and nausea for 4-5 months. No known injury. Subsequent encounter. EXAM: CT ABDOMEN AND PELVIS WITH CONTRAST TECHNIQUE: Multidetector CT imaging of the abdomen and pelvis was performed using the standard protocol following bolus administration  of intravenous contrast. CONTRAST:  100 mL OMNIPAQUE IOHEXOL 300 MG/ML  SOLN COMPARISON:  CT abdomen and pelvis 04/29/2015 and 10/14/2015. FINDINGS: Heart size is normal. No pleural or pericardial effusion. Lung bases are clear. The gallbladder, liver, spleen, kidneys, adrenal glands, pancreas and biliary tree are unremarkable. There is some sigmoid diverticulosis without diverticulitis. The colon is otherwise unremarkable. The stomach, appendix and small bowel appear normal. No lymphadenopathy or fluid is identified. Urinary bladder, prostate gland and seminal vesicles appear normal. No focal bony abnormality is identified. IMPRESSION: Diverticulosis without diverticulitis. The exam is otherwise negative. Electronically Signed   By: Inge Rise M.D.   On: 12/21/2015 16:26   I have personally reviewed and evaluated these images and lab results as part of my medical decision-making.   EKG Interpretation None      MDM   Final diagnoses:  Generalized abdominal pain  Diverticulosis of large intestine without hemorrhage  Diagnostic workup is negative at this time. Patient's vital signs are stable.  He has had chronic pain since his episode of diverticulitis. At this time he is counseled to follow up with gastric urology and given prescriptions for Bentyl and Zofran for symptomatic treatment. Low fat diet is reviewed with liquids for the next 24 hours.    Charlesetta Shanks, MD 12/21/15 1655

## 2016-02-19 ENCOUNTER — Encounter (HOSPITAL_COMMUNITY): Payer: Self-pay | Admitting: Emergency Medicine

## 2016-02-19 ENCOUNTER — Emergency Department (HOSPITAL_COMMUNITY)
Admission: EM | Admit: 2016-02-19 | Discharge: 2016-02-19 | Disposition: A | Discharge status: Home / Self Care | Payer: Medicaid Other | Attending: Emergency Medicine | Admitting: Emergency Medicine

## 2016-02-19 ENCOUNTER — Emergency Department (HOSPITAL_COMMUNITY): Payer: Medicaid Other

## 2016-02-19 DIAGNOSIS — F17210 Nicotine dependence, cigarettes, uncomplicated: Secondary | ICD-10-CM | POA: Diagnosis not present

## 2016-02-19 DIAGNOSIS — R112 Nausea with vomiting, unspecified: Secondary | ICD-10-CM | POA: Diagnosis not present

## 2016-02-19 DIAGNOSIS — R1031 Right lower quadrant pain: Secondary | ICD-10-CM | POA: Insufficient documentation

## 2016-02-19 DIAGNOSIS — Z79899 Other long term (current) drug therapy: Secondary | ICD-10-CM | POA: Insufficient documentation

## 2016-02-19 DIAGNOSIS — M1990 Unspecified osteoarthritis, unspecified site: Secondary | ICD-10-CM | POA: Diagnosis not present

## 2016-02-19 LAB — COMPREHENSIVE METABOLIC PANEL
ALT: 25 U/L (ref 17–63)
AST: 22 U/L (ref 15–41)
Albumin: 4.7 g/dL (ref 3.5–5.0)
Alkaline Phosphatase: 56 U/L (ref 38–126)
Anion gap: 10 (ref 5–15)
BUN: 13 mg/dL (ref 6–20)
CHLORIDE: 109 mmol/L (ref 101–111)
CO2: 20 mmol/L — AB (ref 22–32)
Calcium: 10.1 mg/dL (ref 8.9–10.3)
Creatinine, Ser: 0.95 mg/dL (ref 0.61–1.24)
GFR calc Af Amer: >60 mL/min (ref >60)
Glucose, Bld: 86 mg/dL (ref 65–99)
POTASSIUM: 3.4 mmol/L — AB (ref 3.5–5.1)
Sodium: 139 mmol/L (ref 135–145)
Total Bilirubin: 0.9 mg/dL (ref 0.3–1.2)
Total Protein: 7.4 g/dL (ref 6.5–8.1)

## 2016-02-19 LAB — CBC WITH DIFFERENTIAL/PLATELET
BASOS ABS: 0.1 10*3/uL (ref 0.0–0.1)
BASOS PCT: 1 %
Eosinophils Absolute: 0.4 10*3/uL (ref 0.0–0.7)
Eosinophils Relative: 4 %
HCT: 45.9 % (ref 39.0–52.0)
Hemoglobin: 16.5 g/dL (ref 13.0–17.0)
LYMPHS PCT: 27 %
Lymphs Abs: 2.6 10*3/uL (ref 0.7–4.0)
MCH: 30.1 pg (ref 26.0–34.0)
MCHC: 35.9 g/dL (ref 30.0–36.0)
MCV: 83.8 fL (ref 78.0–100.0)
MONO ABS: 0.6 10*3/uL (ref 0.1–1.0)
Monocytes Relative: 6 %
Neutro Abs: 6 10*3/uL (ref 1.7–7.7)
Neutrophils Relative %: 62 %
PLATELETS: 226 10*3/uL (ref 150–400)
RBC: 5.48 MIL/uL (ref 4.22–5.81)
RDW: 13.2 % (ref 11.5–15.5)
WBC: 9.6 10*3/uL (ref 4.0–10.5)

## 2016-02-19 LAB — URINALYSIS, ROUTINE W REFLEX MICROSCOPIC
Bilirubin Urine: NEGATIVE
GLUCOSE, UA: NEGATIVE mg/dL
Hgb urine dipstick: NEGATIVE
Ketones, ur: NEGATIVE mg/dL
LEUKOCYTES UA: NEGATIVE
Nitrite: NEGATIVE
Protein, ur: NEGATIVE mg/dL
Specific Gravity, Urine: 1.012 (ref 1.005–1.030)
pH: 8.5 — ABNORMAL HIGH (ref 5.0–8.0)

## 2016-02-19 LAB — LIPASE, BLOOD: LIPASE: 22 U/L (ref 11–51)

## 2016-02-19 IMAGING — CR DG ABDOMEN ACUTE W/ 1V CHEST
3 series · 3 of 3 positions shown · non-contrast
Comparison: CT [DATE]

CLINICAL DATA: 40-year-old male with a history of abdominal pain

EXAM:
DG ABDOMEN ACUTE W/ 1V CHEST

[w chest pa]
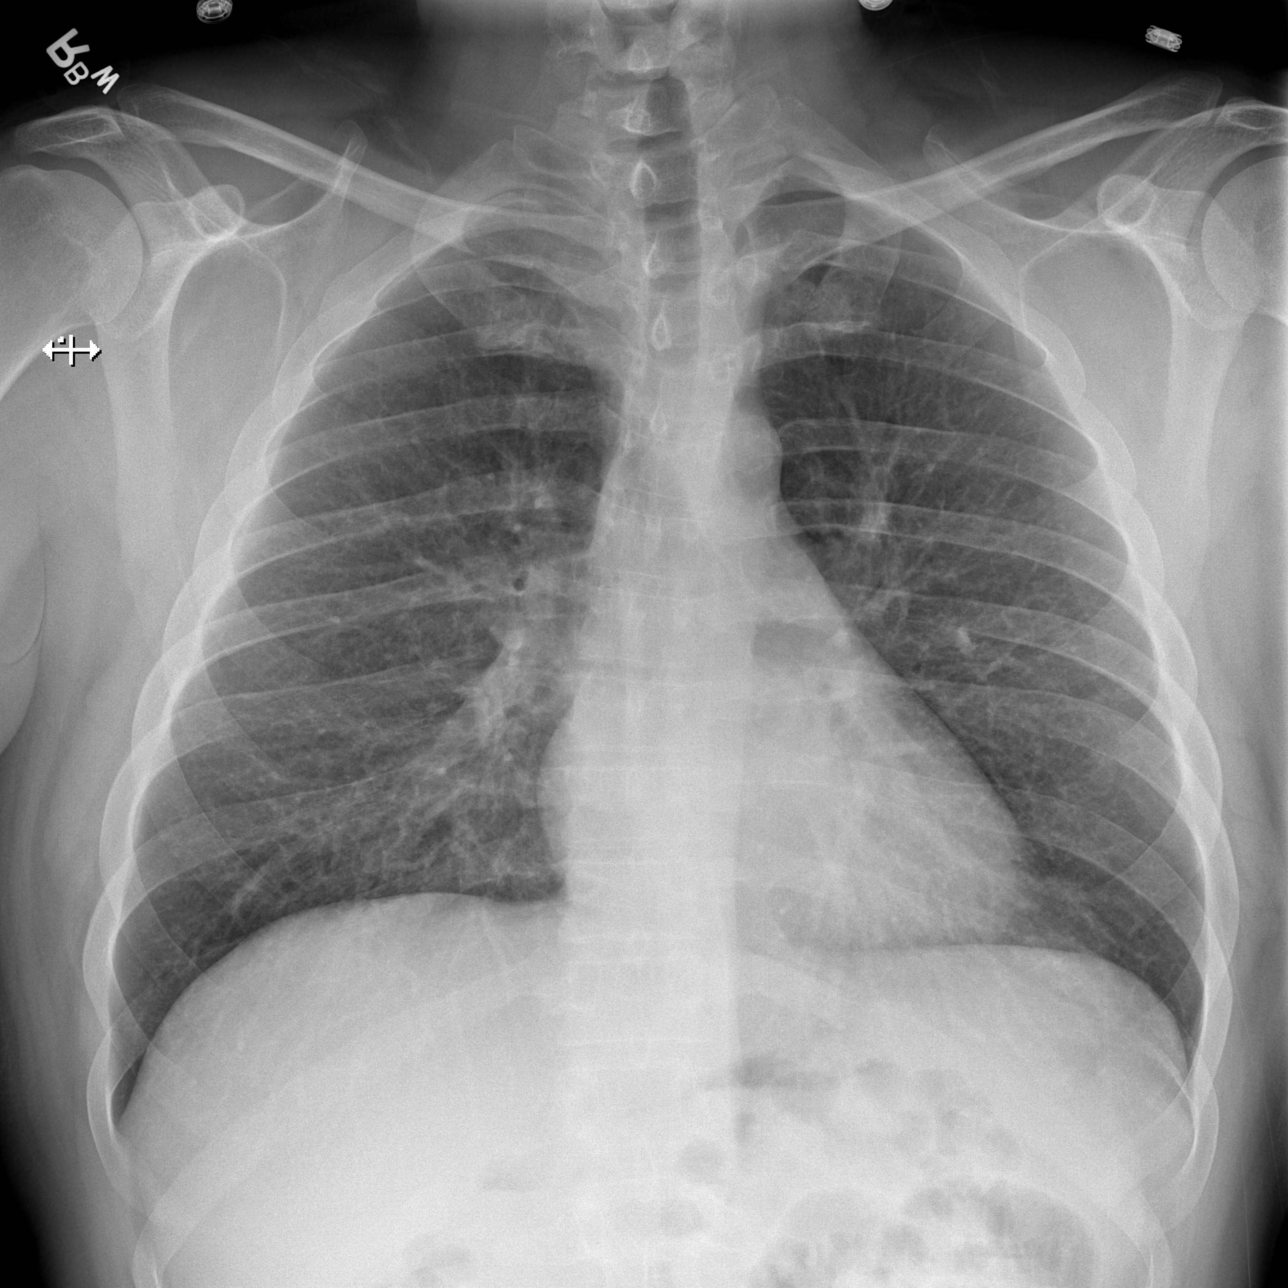

[w abdomen upright]
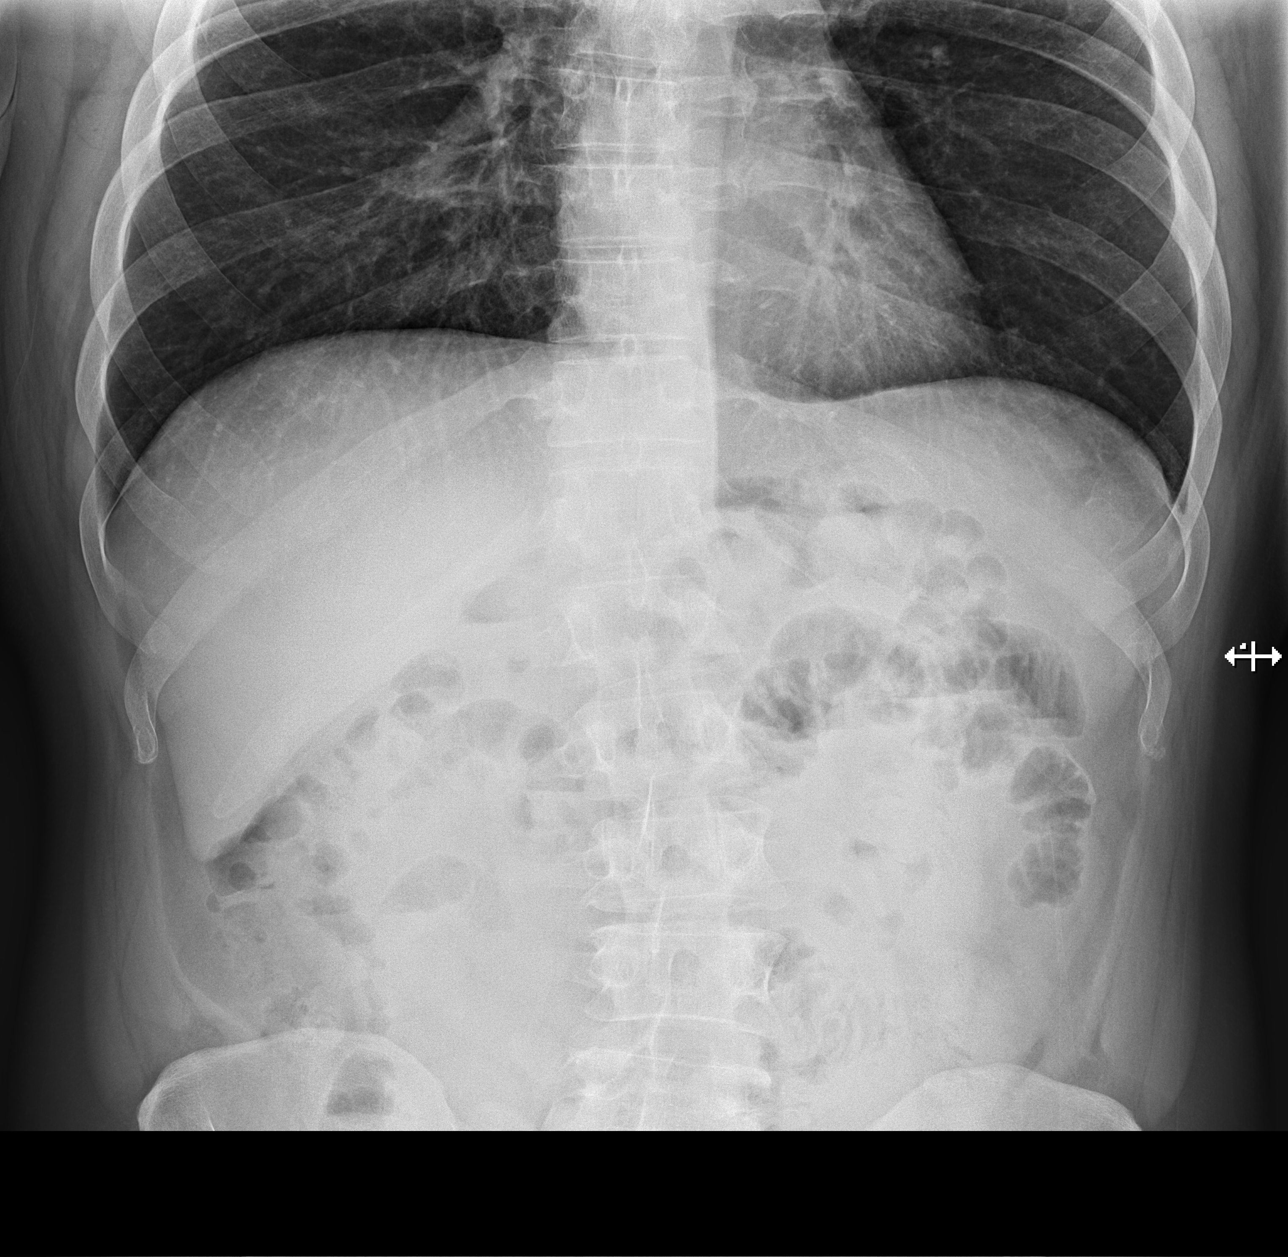

[t abdomen supine]
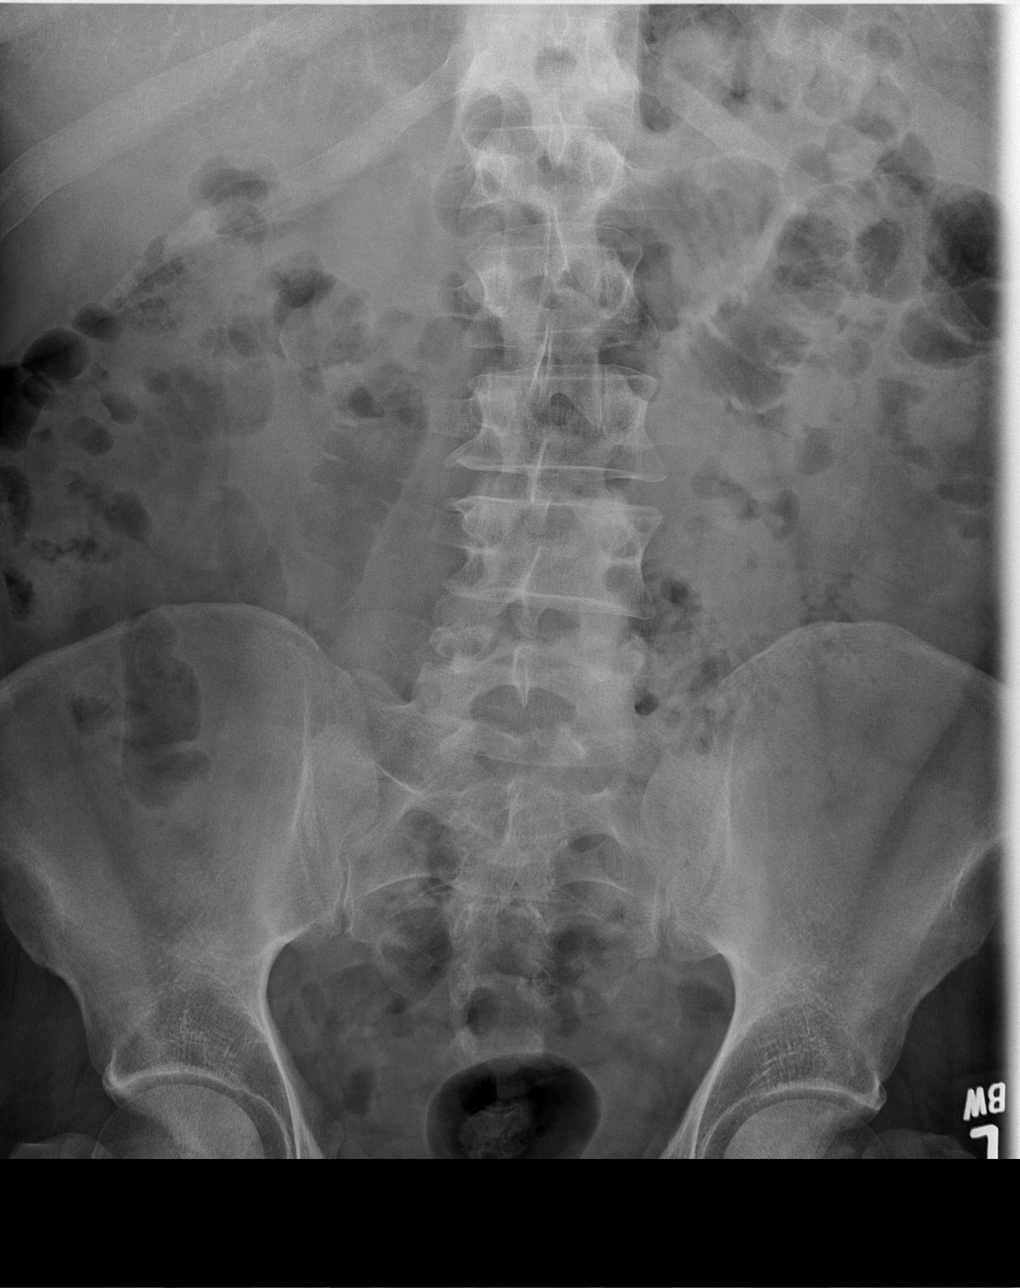

[3 of 3 positions shown; findings below may reference images not displayed]

FINDINGS: Chest:

Cardiomediastinal silhouette within normal limits.

No pneumothorax or pleural effusion.  No confluent airspace disease.

Abdomen:

Gas within stomach, small bowel, colon.  No colonic distention.

Borderline dilated small bowel loops in the left upper quadrant with
air-fluid levels.

No radiopaque foreign body.  No unexpected calcifications.

No displaced fracture.
IMPRESSION: Chest:

No radiographic evidence of acute cardiopulmonary disease.

Abdomen:

Borderline dilated small bowel loops of the left upper quadrant, may
reflect developing bowel obstruction. If there is concern for acute
intra-abdominal abnormality, further evaluation with abdominal CT
recommended, with intravenous contrast only.

## 2016-02-19 MED ORDER — KETOROLAC TROMETHAMINE 30 MG/ML IJ SOLN
30.0000 mg | Freq: Once | INTRAMUSCULAR | Status: AC
  Administered 2016-02-19: 30 mg via INTRAVENOUS
  Filled 2016-02-19: qty 1

## 2016-02-19 MED ORDER — HYDROMORPHONE HCL 1 MG/ML IJ SOLN
1.0000 mg | Freq: Once | INTRAMUSCULAR | Status: AC
  Administered 2016-02-19: 1 mg via INTRAVENOUS
  Filled 2016-02-19: qty 1

## 2016-02-19 MED ORDER — PROMETHAZINE HCL 25 MG RE SUPP: 25.0000 mg | Freq: Four times a day (QID) | RECTAL | Status: DC | PRN

## 2016-02-19 MED ORDER — LORAZEPAM 2 MG/ML IJ SOLN
1.0000 mg | Freq: Once | INTRAMUSCULAR | Status: AC
  Administered 2016-02-19: 1 mg via INTRAVENOUS
  Filled 2016-02-19: qty 1

## 2016-02-19 MED ORDER — ONDANSETRON HCL 4 MG/2ML IJ SOLN
4.0000 mg | Freq: Once | INTRAMUSCULAR | Status: AC
  Administered 2016-02-19: 4 mg via INTRAVENOUS
  Filled 2016-02-19: qty 2

## 2016-02-19 MED ORDER — SODIUM CHLORIDE 0.9 % IV BOLUS (SEPSIS)
2000.0000 mL | Freq: Once | INTRAVENOUS | Status: AC
  Administered 2016-02-19: 2000 mL via INTRAVENOUS

## 2016-02-19 MED ORDER — SODIUM CHLORIDE 0.9 % IV SOLN
INTRAVENOUS | Status: DC
Start: 2016-02-19 — End: 2016-02-19
  Administered 2016-02-19: 13:00:00 via INTRAVENOUS

## 2016-02-19 NOTE — ED Notes (Addendum)
Pt reports intermittent abd pain and emesis for the past 5 months. Pt being worked up for cause of abd pain by GI.  Today pt having abd pain and emesis when he began having bilateral foot and hand tingling at 1100. Sensation to feet has returned.

## 2016-02-19 NOTE — Discharge Instructions (Signed)

## 2016-02-19 NOTE — ED Provider Notes (Signed)
CSN: [id number]     Arrival date & time 02/19/16  1211 History   First MD Initiated Contact with Patient 02/19/16 1228     Chief Complaint  Patient presents with  . Emesis  . Abdominal Pain  . Hand Problem     (Consider location/radiation/quality/duration/timing/severity/associated sxs/prior Treatment) HPI Comments: Patient here complaining of 5 month history of intermittent right mid and lower quadrant abdominal discomfort. Current symptoms are similar to his prior episodes. He has had nausea but no vomiting. No change in his bowel habits. No fever or chills. Denies any urinary symptoms. Symptoms are persistent and nothing makes them better or worse. Has used his home medications without relief. Denies any rashes to his flank.  Patient is a 40 y.o. male presenting with vomiting and abdominal pain. The history is provided by the patient.  Emesis Associated symptoms: abdominal pain   Abdominal Pain Associated symptoms: vomiting     Past Medical History  Diagnosis Date  . CTS (carpal tunnel syndrome)   . Arthritis   . Colitis    Past Surgical History  Procedure Laterality Date  . Vasectomy    . Colonoscopy N/A 04/23/2015    Procedure: DIAGNOSTIC COLONOSCOPY;  Surgeon: [name], MD;  Location: [facility];  Service: Endoscopy;  Laterality: N/A;   Family History  Problem Relation Age of Onset  . COPD Mother   . Drug abuse Mother     EtOH  . Diabetes Father   . Kidney disease Father   . Eczema Brother   . Mental retardation Brother     Down's Syndrome   Social History  Substance Use Topics  . Smoking status: Current Every Day Smoker -- 1.00 packs/day    Types: Cigarettes  . Smokeless tobacco: Never Used  . Alcohol Use: No    Review of Systems  Gastrointestinal: Positive for vomiting and abdominal pain.  All other systems reviewed and are negative.     Allergies  Review of patient's allergies indicates no known allergies.  Home Medications   Prior to  Admission medications   Medication Sig Start Date End Date Taking? Authorizing Provider  acetaminophen (TYLENOL) 325 MG tablet Take 1-2 tablets (325-650 mg total) by mouth every 6 (six) hours as needed for fever, headache, mild pain or moderate pain. Patient not taking: Reported on 04/29/2015 03/17/15   Nat Christen, PA-C  alum & mag hydroxide-simeth (MAALOX MAX) 400-400-40 MG/5ML suspension Take 10 mLs by mouth every 6 (six) hours as needed for indigestion.    Historical Provider, MD  bisacodyl (DULCOLAX) 5 MG EC tablet Take 5 mg by mouth daily as needed for mild constipation or moderate constipation.    Historical Provider, MD  dicyclomine (BENTYL) 20 MG tablet Take 1 tablet (20 mg total) by mouth 2 (two) times daily. 12/21/15   Charlesetta Shanks, MD  docusate sodium (COLACE) 100 MG capsule Take 1 capsule (100 mg total) by mouth 2 (two) times daily as needed for mild constipation. Patient taking differently: Take 100 mg by mouth 2 (two) times daily as needed for mild constipation or moderate constipation.  03/17/15   Nat Christen, PA-C  HYDROcodone-acetaminophen (NORCO/VICODIN) 5-325 MG per tablet Take 1-2 tablets by mouth every 4 (four) hours as needed. 04/29/15   Geronimo Boot, MD  ibuprofen (ADVIL,MOTRIN) 200 MG tablet Take 600 mg by mouth every 8 (eight) hours as needed for headache, mild pain or moderate pain.     Historical Provider, MD  ondansetron (ZOFRAN ODT) 4 MG disintegrating  tablet Take 1 tablet (4 mg total) by mouth every 4 (four) hours as needed for nausea or vomiting. 12/21/15   Charlesetta Shanks, MD  ondansetron (ZOFRAN ODT) 8 MG disintegrating tablet Take 1 tablet (8 mg total) by mouth every 8 (eight) hours as needed for nausea or vomiting. 10/14/15   Carrie Mew, MD  ondansetron (ZOFRAN) 4 MG tablet Take 1 tablet (4 mg total) by mouth every 6 (six) hours. 04/29/15   Geronimo Boot, MD  polyethylene glycol [facility] Floria Raveling) packet Take 17 g by mouth daily as needed. Patient taking  differently: Take 17 g by mouth daily as needed for mild constipation or moderate constipation.  03/17/15   Nat Christen, PA-C  ranitidine (ZANTAC) 150 MG capsule Take 1 capsule (150 mg total) by mouth 2 (two) times daily. 10/14/15   Carrie Mew, MD  sucralfate (CARAFATE) 1 g tablet Take 1 tablet (1 g total) by mouth 4 (four) times daily. 10/14/15   Carrie Mew, MD   BP 132/88 mmHg  Pulse 71  Temp(Src) 98 F (36.7 C) (Oral)  Resp 22  SpO2 100% Physical Exam  Constitutional: He is oriented to person, place, and time. He appears well-developed and well-nourished.  Non-toxic appearance. No distress.  HENT:  Head: Normocephalic and atraumatic.  Eyes: Conjunctivae, EOM and lids are normal. Pupils are equal, round, and reactive to light.  Neck: Normal range of motion. Neck supple. No tracheal deviation present. No thyroid mass present.  Cardiovascular: Normal rate, regular rhythm and normal heart sounds.  Exam reveals no gallop.   No murmur heard. Pulmonary/Chest: Effort normal and breath sounds normal. No stridor. No respiratory distress. He has no decreased breath sounds. He has no wheezes. He has no rhonchi. He has no rales.  Abdominal: Soft. Normal appearance and bowel sounds are normal. He exhibits no distension. There is tenderness in the right lower quadrant. There is no rigidity, no rebound, no guarding and no CVA tenderness.    Musculoskeletal: Normal range of motion. He exhibits no edema or tenderness.  Neurological: He is alert and oriented to person, place, and time. He has normal strength. No cranial nerve deficit or sensory deficit. GCS eye subscore is 4. GCS verbal subscore is 5. GCS motor subscore is 6.  Skin: Skin is warm and dry. No abrasion and no rash noted.  Psychiatric: He has a normal mood and affect. His speech is normal and behavior is normal.  Nursing note and vitals reviewed.   ED Course  Procedures (including critical care time) Labs Review Labs Reviewed    CBC WITH DIFFERENTIAL/PLATELET  COMPREHENSIVE METABOLIC PANEL  LIPASE, BLOOD  URINALYSIS, ROUTINE W REFLEX MICROSCOPIC (NOT AT [facility])    Imaging Review No results found. I have personally reviewed and evaluated these images and lab results as part of my medical decision-making.   EKG Interpretation None      MDM   Final diagnoses:  None    Patient given IV fluids and pain medication here. Abdominal films reviewed with him and it is noted that the patient's had 5 abdominal CTs in the last 11 months. He has had no emesis here. His only had nausea without vomiting. Joint decision-making was performed with the patient is decided that he will not have an abdominal CT at this time and that he will return here if he has emesis, fever, bloody stools, or feels worse. Will prescribe Phenergan suppositories    Lacretia Leigh, MD 02/19/16 1426

## 2016-04-12 ENCOUNTER — Ambulatory Visit: Payer: Medicaid Other | Attending: Internal Medicine | Admitting: Internal Medicine

## 2016-04-12 ENCOUNTER — Encounter: Payer: Self-pay | Admitting: Internal Medicine

## 2016-04-12 VITALS — BP 136/83 | HR 77 | Temp 98.3°F | Resp 18 | Ht 72.0 in | Wt 219.0 lb

## 2016-04-12 DIAGNOSIS — K5732 Diverticulitis of large intestine without perforation or abscess without bleeding: Secondary | ICD-10-CM | POA: Insufficient documentation

## 2016-04-12 DIAGNOSIS — G8929 Other chronic pain: Secondary | ICD-10-CM

## 2016-04-12 DIAGNOSIS — K828 Other specified diseases of gallbladder: Secondary | ICD-10-CM | POA: Insufficient documentation

## 2016-04-12 DIAGNOSIS — R1011 Right upper quadrant pain: Secondary | ICD-10-CM | POA: Insufficient documentation

## 2016-04-12 DIAGNOSIS — M1990 Unspecified osteoarthritis, unspecified site: Secondary | ICD-10-CM | POA: Insufficient documentation

## 2016-04-12 DIAGNOSIS — G5600 Carpal tunnel syndrome, unspecified upper limb: Secondary | ICD-10-CM | POA: Insufficient documentation

## 2016-04-12 DIAGNOSIS — R1010 Upper abdominal pain, unspecified: Secondary | ICD-10-CM

## 2016-04-12 MED ORDER — ONDANSETRON HCL 4 MG PO TABS: 4.0000 mg | ORAL_TABLET | Freq: Four times a day (QID) | ORAL | Status: DC | PRN

## 2016-04-12 MED ORDER — DICYCLOMINE HCL 20 MG PO TABS: 20.0000 mg | ORAL_TABLET | Freq: Two times a day (BID) | ORAL | Status: DC

## 2016-04-12 MED FILL — ?DICYCLOMINE 20 MG TABLET: 20 | 10 days supply | Qty: 20 | Fill #0

## 2016-04-12 MED FILL — ONDANSETRON HCL 4 MG TABLET: 4 | 2 days supply | Qty: 10 | Fill #0

## 2016-04-12 NOTE — Patient Instructions (Addendum)
It was good to see you today.  We have reviewed your prior records including labs and tests today  Test(s) ordered today: HIDA scan for gallbladder function test. We will call you with results after review, usually within 72hours after test completion. If any changes or referrals need to be made, you will be notified at that same time.  Medications reviewed and updated Your prescription(s) have been submitted to your pharmacy. Please take as directed and contact our office if you believe you are having problem(s) with the medication(s).  Biliary Colic Biliary colic is a pain in the upper abdomen. The pain:  Is usually felt on the right side of the abdomen, but it may also be felt in the center of the abdomen, just below the breastbone (sternum).  May spread back toward the right shoulder blade.  May be steady or irregular.  May be accompanied by nausea and vomiting. Most of the time, the pain goes away in 1-5 hours. After the most intense pain passes, the abdomen may continue to ache mildly for about 24 hours. Biliary colic is caused by a blockage in the bile duct. The bile duct is a pathway that carries bile--a liquid that helps to digest fats--from the gallbladder to the small intestine. Biliary colic usually occurs after eating, when the digestive system demands bile. The pain develops when muscle cells contract forcefully to try to move the blockage so that bile can get by. HOME CARE INSTRUCTIONS  Take medicines only as directed by your health care provider.  Drink enough fluid to keep your urine clear or pale yellow.  Avoid fatty, greasy, and fried foods. These kinds of foods increase your body's demand for bile.  Avoid any foods that make your pain worse.  Avoid overeating.  Avoid having a large meal after fasting. SEEK MEDICAL CARE IF:  You develop a fever.  Your pain gets worse.  You vomit.  You develop nausea that prevents you from eating and drinking. SEEK  IMMEDIATE MEDICAL CARE IF:  You suddenly develop a fever and shaking chills.  You develop a yellowish discoloration (jaundice) of:  Skin.  Whites of the eyes.  Mucous membranes.  You have continuous or severe pain that is not relieved with medicines.  You have nausea and vomiting that is not relieved with medicines.  You develop dizziness or you faint.   This information is not intended to replace advice given to you by your health care provider. Make sure you discuss any questions you have with your health care provider.   Document Released: 02/19/2006 Document Revised: 02/02/2015 Document Reviewed: 06/30/2014 Elsevier Interactive Patient Education [employer].

## 2016-04-12 NOTE — Assessment & Plan Note (Signed)
Ongoing since 10/2015 with 3 ED visits for same Daily pain, worse with fatty foods Prior microperf of sig diverticulitis 03/2015, but no other abn on subsequent imaging tests including no GB thickening or stones symptoms sound bilary so arrange HIDA and plan refer to gen surg continue H2B, bentyl and anti nausea until then Pt understands and agrees to next steps for eval and tx

## 2016-04-12 NOTE — Progress Notes (Signed)
Subjective:    Patient ID: Eric Peck, male    DOB: 1976/03/30, 40 y.o.   MRN: [id number]  HPI  Patient here for 04/12/2016 follow up - R side abd pain - current RUQ/RLQ ongoing >52mo. Usually associated with nausea and vomiting in the mornings but no change bowel habits or weight changes. Worse with fatty foods or rolling over in bed. Describes as stabbing 4/10 pain in RUQ constant discomfort. No radiation. Prior hx and hospitalization for colitis and known diverticulitis as per colo 04/2015 reviewed. Pain occurs daily. Med compliance complicated by financial hardship and homelessness.  Prior imaging (Abd [place] 04/2015 and two CT Abd 10/2015 and 12/2015) without new abnormalities  Past Medical History  Diagnosis Date  . CTS (carpal tunnel syndrome)   . Arthritis   . Diverticulitis of large intestine with perforation 03/2015    microperf of sigmoid colitis, tx with abx, no surg    Review of Systems  Constitutional: Negative for fever and fatigue.  Respiratory: Negative for cough and shortness of breath.   Cardiovascular: Negative for chest pain and leg swelling.  Skin: Negative for color change and rash.       Objective:    Physical Exam  Constitutional: He appears well-developed and well-nourished. No distress.  Cardiovascular: Normal rate, regular rhythm and normal heart sounds.   No murmur heard. Pulmonary/Chest: Effort normal and breath sounds normal. No respiratory distress.  Abdominal: Soft. Bowel sounds are normal. He exhibits no distension and no mass. Tenderness: mild RUQ but no R/G. There is no rebound and no guarding.    BP 136/83 mmHg  Pulse 77  Temp(Src) 98.3 F (36.8 C) (Oral)  Resp 18  Ht 6' (1.829 m)  Wt 219 lb (99.338 kg)  BMI 29.70 kg/m2  SpO2 98% Wt Readings from Last 3 Encounters:  04/12/16 219 lb (99.338 kg)  10/14/15 225 lb (102.059 kg)  04/29/15 220 lb (99.791 kg)     Lab Results  Component Value Date   WBC 9.6 02/19/2016   HGB 16.5  02/19/2016   HCT 45.9 02/19/2016   PLT 226 02/19/2016   GLUCOSE 86 02/19/2016   ALT 25 02/19/2016   AST 22 02/19/2016   NA 139 02/19/2016   K 3.4* 02/19/2016   CL 109 02/19/2016   CREATININE 0.95 02/19/2016   BUN 13 02/19/2016   CO2 20* 02/19/2016   TSH 1.72 02/23/2013    Dg Abd Acute W/chest  02/19/2016  CLINICAL DATA:  40 year old male with a history of abdominal pain EXAM: DG ABDOMEN ACUTE W/ 1V CHEST COMPARISON:  CT 12/21/2015 FINDINGS: Chest: Cardiomediastinal silhouette within normal limits. No pneumothorax or pleural effusion.  No confluent airspace disease. Abdomen: Gas within stomach, small bowel, colon.  No colonic distention. Borderline dilated small bowel loops in the left upper quadrant with air-fluid levels. No radiopaque foreign body.  No unexpected calcifications. No displaced fracture. IMPRESSION: Chest: No radiographic evidence of acute cardiopulmonary disease. Abdomen: Borderline dilated small bowel loops of the left upper quadrant, may reflect developing bowel obstruction. If there is concern for acute intra-abdominal abnormality, further evaluation with abdominal CT recommended, with intravenous contrast only. Signed, Eric Peck. Eric Newport, Eric Peck [facility] Radiology Electronically Signed   By: Eric Mckusick D.O.   On: 02/19/2016 14:02   COLONOSCOPY PROCEDURE REPORT  PATIENT:Eric Peck, Eric Peck [id number] BIRTHDATE:1975-11-29 , 39 yrs. oldGENDER:male ENDOSCOPIST:Eric Marijo Conception, Eric Peck REFERRED CN:[id number] Eric Peck, M.D. PROCEDURE DATE: 04/23/2015 PROCEDURE: Colonoscopy, diagnostic ASA CLASS: Class II INDICATIONS:follow  up for previously diagnosed diverticulitis. MEDICATIONS:Benadryl 25 mg IV, Fentanyl 100 mcg IV, and Versed 10 mg IV  DESCRIPTION OF PROCEDURE: After the risks benefits and alternatives of the procedure were thoroughly explained, informed consent was obtained. revealed no abnormalities  of the rectum. The [id number] endoscope was introduced through the anus and advanced to the cecum, which was identified by the ileocecal valve. No adverse events experienced. The quality of the prep was good. The instrument was then slowly withdrawn as the colon was fully examined. Estimated blood loss is zero unless otherwise noted in this procedure report.  Findings:  COLON FINDINGS: A circumferential diffuse patch of abnormal mucosa was found in the sigmoid colon. The mucosa was edematous and congested. Retroflexed views revealed no abnormalities. Insertion time was 22 minutes. Withdrawal time was 8 minutes. The scope was withdrawn and the procedure completed.  COMPLICATIONS: There were no immediate complications.  ENDOSCOPIC IMPRESSION: Circumferential diffuse abnormal mucosa was found in the sigmoid colon; The mucosa was edematous and congested  RECOMMENDATIONS: Repeat Colonoscopy at age 89.  eSigned: Rosario Adie, Eric Peck [id number] 9:49 AM   cc: Eric Peck    Assessment & Plan:   Problem List Items Addressed This Visit    Chronic RUQ pain - Primary    Ongoing since 10/2015 with 3 ED visits for same Daily pain, worse with fatty foods Prior microperf of sig diverticulitis 03/2015, but no other abn on subsequent imaging tests including no GB thickening or stones symptoms sound bilary so arrange HIDA and plan refer to gen surg continue H2B, bentyl and anti nausea until then Pt understands and agrees to next steps for eval and tx      Relevant Orders   NM Hepato W/EjeCT Fract    Other Visit Diagnoses    Biliary dyskinesia        Relevant Orders    NM Hepato W/EjeCT Fract        Gwendolyn Grant, Eric Peck

## 2016-04-12 NOTE — Progress Notes (Signed)
Patient is here for ED FU for Abdominal Pain  Patient complains of chronic abdominal pain being present which is scaled at a 4. Patient complains of right lower quadrant pain described as stabbing and swollen.  Patient is currently homeless and can not afford to be on medications. Patient is aware of [facility] pharmacy programs.  Patient has not eaten today.  Patient denies any suicidal ideations at this time.

## 2016-04-17 IMAGING — US US ABDOMEN LIMITED
1 series · 14 of 25 positions shown · non-contrast
Comparison: [DATE] CT with contrast

CLINICAL DATA: Chronic right upper quadrant pain for 2 years.
Abnormal gallbladder by CT earlier today.

EXAM:
US ABDOMEN LIMITED - RIGHT UPPER QUADRANT

[Series 1: us abdomen limited · 0.23mm/px · 14 of 60 slices shown]
[im 1/60]
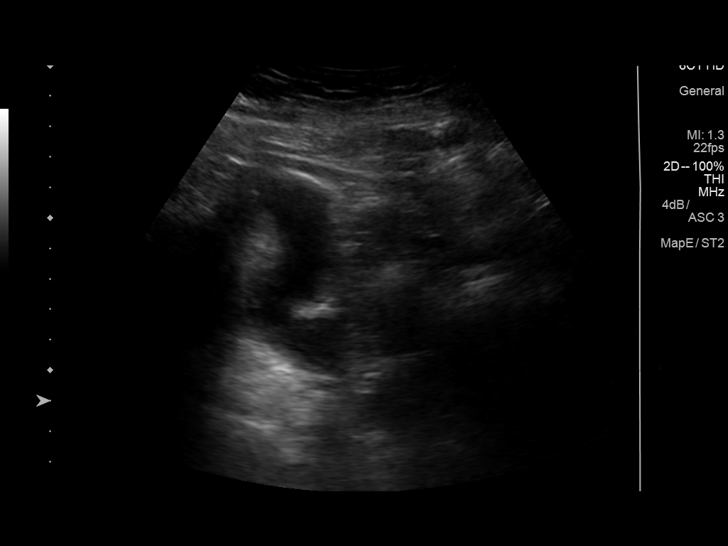
[im 5/60]
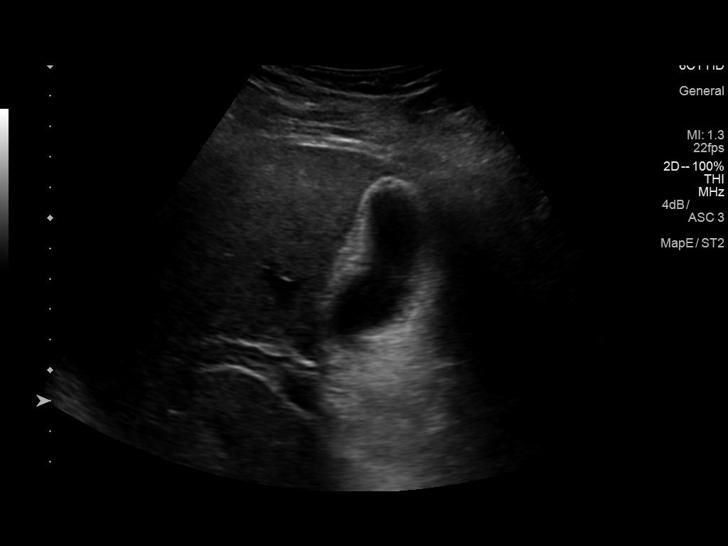
[im 10/60]
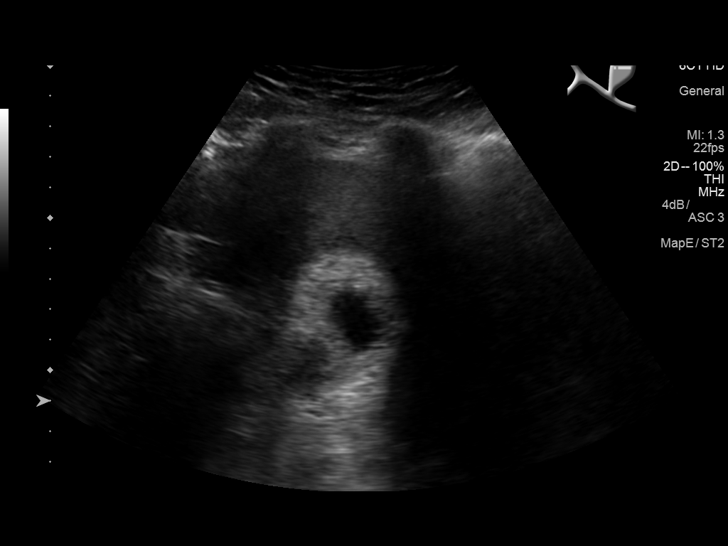
[im 15/60]
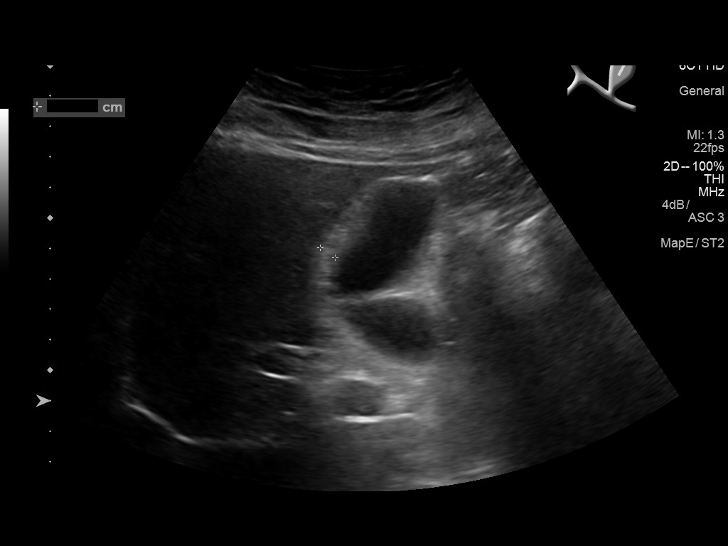
[im 20/60]
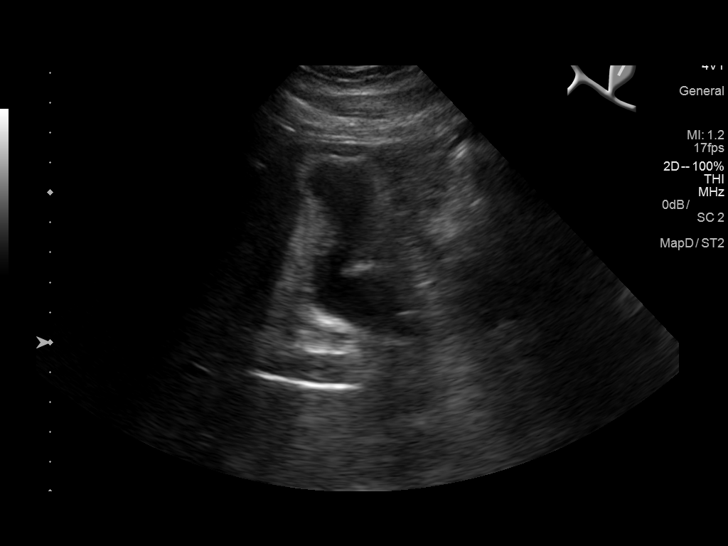
[im 23/60]
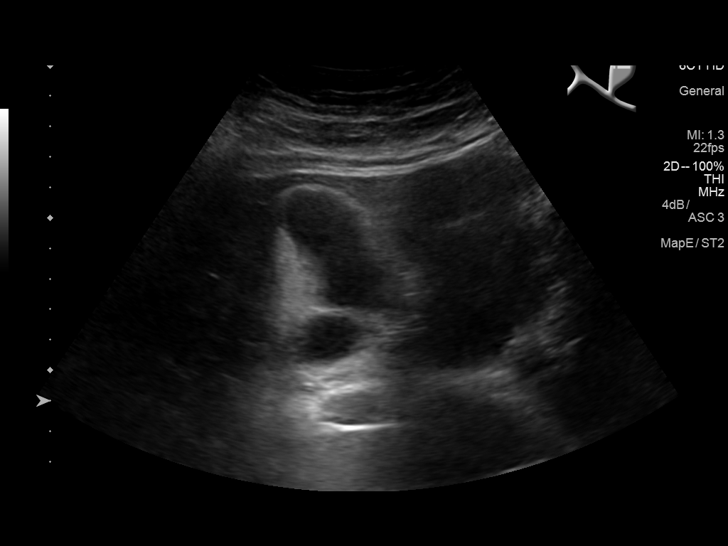
[im 28/60]
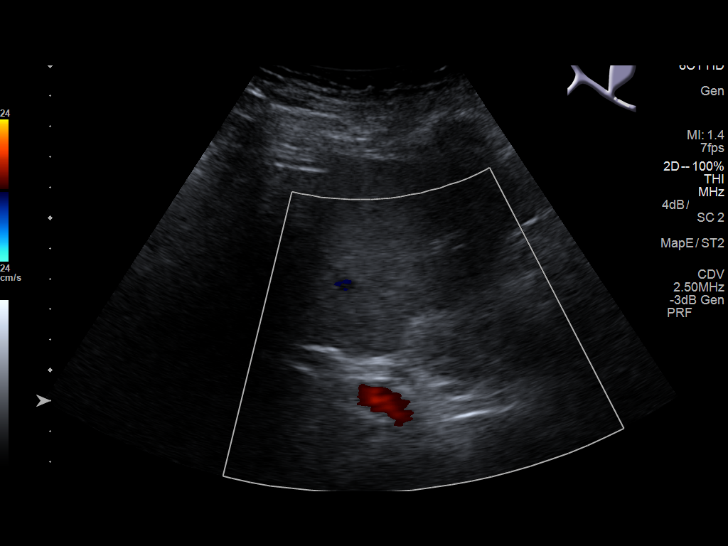
[im 32/60]
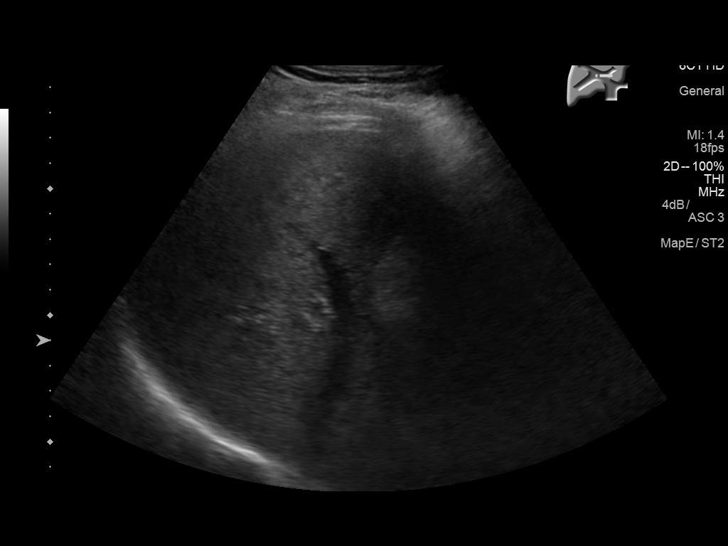
[im 37/60]
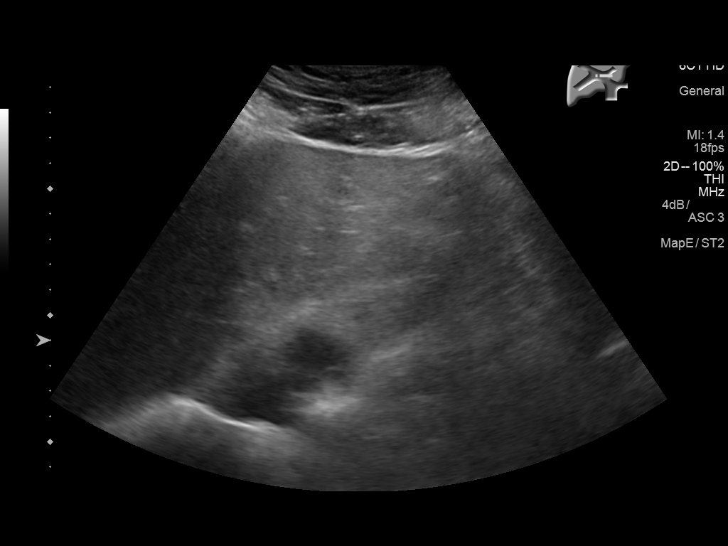
[im 40/60]
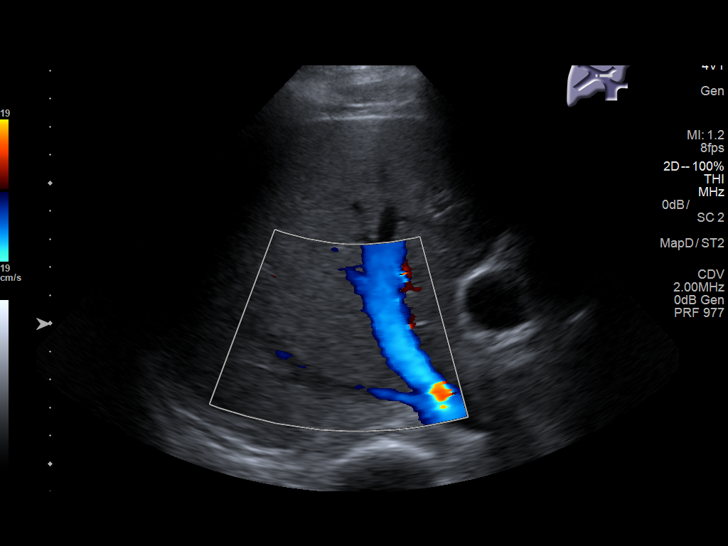
[im 45/60]
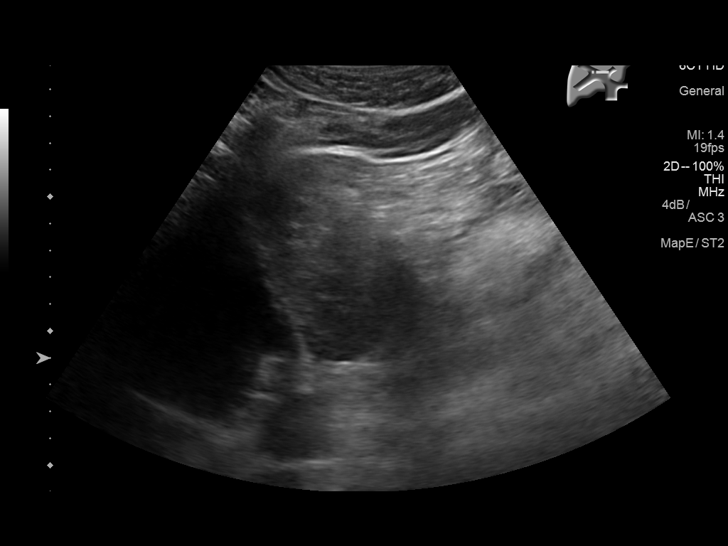
[im 50/60]
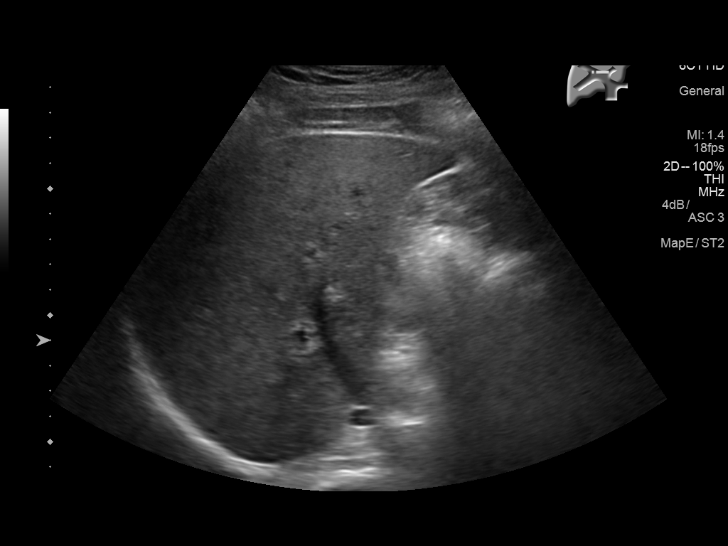
[im 55/60]
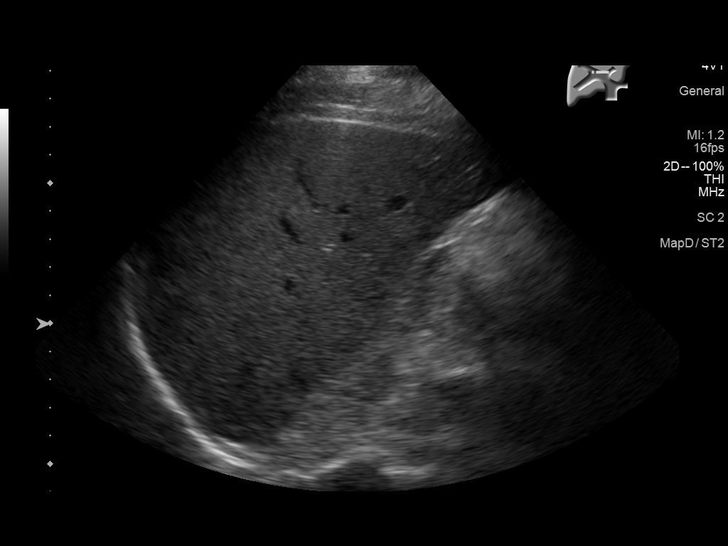
[im 60/60]
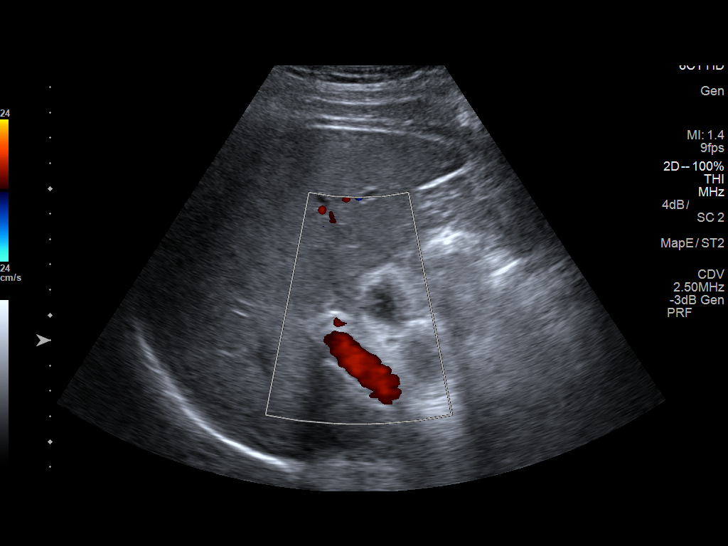

[14 of 25 positions shown; findings below may reference images not displayed]

FINDINGS: Gallbladder:

Negative for gallstones. No Murphys sign elicited. Gallbladder wall
is thickened 6 mm.

Common bile duct:

Diameter: 3.8 mm

Liver:

No focal lesion identified. Within normal limits in parenchymal
echogenicity. Patent portal and hepatic veins with normal
directional flow.

No upper abdominal ascites.
IMPRESSION: Gallbladder wall thickening measuring up to 6 mm but no associated
cholelithiasis or Murphy's sign and. This may reflect chronic
cholecystitis.

No biliary dilatation.

## 2016-04-26 ENCOUNTER — Encounter (HOSPITAL_COMMUNITY)
Admission: RE | Admit: 2016-04-26 | Discharge: 2016-04-26 | Disposition: A | Discharge status: Home / Self Care | Payer: Medicaid Other | Source: Ambulatory Visit | Attending: Internal Medicine | Admitting: Internal Medicine

## 2016-04-26 DIAGNOSIS — R1011 Right upper quadrant pain: Secondary | ICD-10-CM

## 2016-04-26 DIAGNOSIS — G8929 Other chronic pain: Secondary | ICD-10-CM | POA: Diagnosis present

## 2016-04-26 DIAGNOSIS — R1010 Upper abdominal pain, unspecified: Secondary | ICD-10-CM | POA: Diagnosis present

## 2016-04-26 DIAGNOSIS — K828 Other specified diseases of gallbladder: Secondary | ICD-10-CM | POA: Insufficient documentation

## 2016-04-26 IMAGING — NM NM HEPATO W/GB/PHARM/[PERSON_NAME]
2 series · 12 of 12 positions shown · non-contrast
Comparison: None.

CLINICAL DATA: Right upper quadrant pain with nausea and vomiting

EXAM:
NUCLEAR MEDICINE HEPATOBILIARY IMAGING WITH GALLBLADDER EF
Views: Anterior, right lateral right upper
RADIOPHARMACEUTICALS:  5.2 mCi [Y7]  Choletec IV

[Series 1: biliary · 4.14mm/px · 6 of 58 frames shown]
[frame 5/58]
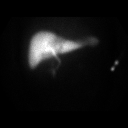
[frame 15/58]
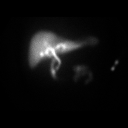
[frame 25/58]
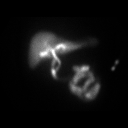
[frame 34/58]
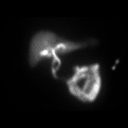
[frame 44/58]
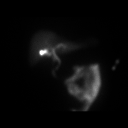
[frame 54/58]
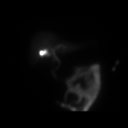

[Series 3: gbef · 4.14mm/px · 6 of 60 frames shown]
[frame 6/60]
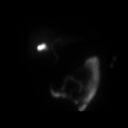
[frame 16/60]
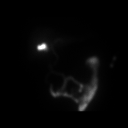
[frame 26/60]
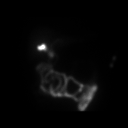
[frame 36/60]
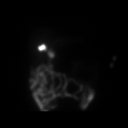
[frame 46/60]
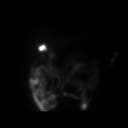
[frame 56/60]
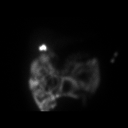

[12 of 12 positions shown; findings below may reference images not displayed]

FINDINGS: Liver uptake of radiotracer is normal. There is prompt visualization
of gallbladder and small bowel, indicating patency of the cystic and
common bile ducts.

The patient consumed 8 ounces of Ensure Complete orally. The patient
reported nausea with the Ensure Complete agent. The computer
generated ejection fraction of radiotracer from the gallbladder is
normal at 63%, normal greater than 33% using the oral agent.
IMPRESSION: Normal ejection fraction of radiotracer from the gallbladder. The
patient did experience nausea with the oral Ensure Complete
consumption. Cystic and common bile ducts are patent as is evidenced
by visualization of gallbladder and small bowel.

## 2016-04-26 MED ORDER — TECHNETIUM TC 99M TETROFOSMIN IV KIT: 30.0000 | PACK | Freq: Once | INTRAVENOUS | Status: DC | PRN

## 2016-04-26 MED ORDER — TECHNETIUM TC 99M MEBROFENIN IV KIT
5.0000 | PACK | Freq: Once | INTRAVENOUS | Status: AC | PRN
  Administered 2016-04-26: 5 via INTRAVENOUS

## 2016-05-02 ENCOUNTER — Other Ambulatory Visit: Payer: Self-pay | Admitting: Internal Medicine

## 2016-05-02 NOTE — Telephone Encounter (Signed)
Patient verified DOB Patient is aware of CT showing gallbladder being healthy and functioning normally. Patient is advised to continue with current medications prescribed and FU in 6 weeks if problems persist. Patient is requesting a refill on Bentyl. MA informed patient of request being routed to provider for approval.

## 2016-05-02 NOTE — Telephone Encounter (Signed)
Patient needs results from scan of intestines Please follow up

## 2016-05-02 NOTE — Telephone Encounter (Signed)
-----   Message from Rowe Clack, MD sent at 04/27/2016 12:56 PM EDT ----- Eric Peck - I have released these results and comment to patient via [place]. Will you also please contact him to be sure he has seen the information as follows: "These test results show your gallbladder appears healthy and is functioning normally. So, at this time, I do not believe a surgical evaluation to remove your gallbladder would be helpful. Please continue the medications as prescribed and schedule follow up appointment at our clinic if continued problems in the next 6 weeks."

## 2016-05-24 ENCOUNTER — Encounter: Payer: Self-pay | Admitting: Internal Medicine

## 2016-05-24 ENCOUNTER — Ambulatory Visit: Payer: Medicaid Other | Attending: Internal Medicine | Admitting: Internal Medicine

## 2016-05-24 VITALS — BP 144/85 | HR 66 | Temp 98.4°F | Wt 224.6 lb

## 2016-05-24 DIAGNOSIS — F17210 Nicotine dependence, cigarettes, uncomplicated: Secondary | ICD-10-CM | POA: Diagnosis not present

## 2016-05-24 DIAGNOSIS — K5732 Diverticulitis of large intestine without perforation or abscess without bleeding: Secondary | ICD-10-CM | POA: Diagnosis not present

## 2016-05-24 DIAGNOSIS — G8929 Other chronic pain: Secondary | ICD-10-CM | POA: Insufficient documentation

## 2016-05-24 DIAGNOSIS — Z23 Encounter for immunization: Secondary | ICD-10-CM

## 2016-05-24 DIAGNOSIS — Z79899 Other long term (current) drug therapy: Secondary | ICD-10-CM | POA: Diagnosis not present

## 2016-05-24 DIAGNOSIS — K5900 Constipation, unspecified: Secondary | ICD-10-CM | POA: Insufficient documentation

## 2016-05-24 DIAGNOSIS — R1010 Upper abdominal pain, unspecified: Secondary | ICD-10-CM | POA: Diagnosis not present

## 2016-05-24 DIAGNOSIS — M1990 Unspecified osteoarthritis, unspecified site: Secondary | ICD-10-CM | POA: Diagnosis not present

## 2016-05-24 DIAGNOSIS — G5600 Carpal tunnel syndrome, unspecified upper limb: Secondary | ICD-10-CM | POA: Insufficient documentation

## 2016-05-24 DIAGNOSIS — R1011 Right upper quadrant pain: Secondary | ICD-10-CM | POA: Diagnosis not present

## 2016-05-24 DIAGNOSIS — K5730 Diverticulosis of large intestine without perforation or abscess without bleeding: Secondary | ICD-10-CM

## 2016-05-24 DIAGNOSIS — Z720 Tobacco use: Secondary | ICD-10-CM | POA: Insufficient documentation

## 2016-05-24 MED ORDER — DICYCLOMINE HCL 20 MG PO TABS: 20.0000 mg | ORAL_TABLET | Freq: Two times a day (BID) | ORAL | 0 refills | Status: DC

## 2016-05-24 MED FILL — DICYCLOMINE 20 MG TABLET: 20 | 10 days supply | Qty: 20 | Fill #0

## 2016-05-24 NOTE — Patient Instructions (Addendum)
Try ground flax seed for fiber source. It was a pleasure meeting you today.   Pneumococcal Polysaccharide Vaccine: What You Need to Know 1. Why get vaccinated? Vaccination can protect older adults (and some children and younger adults) from pneumococcal disease. Pneumococcal disease is caused by bacteria that can spread from person to person through close contact. It can cause ear infections, and it can also lead to more serious infections of the:   Lungs (pneumonia),  Blood (bacteremia), and  Covering of the brain and spinal cord (meningitis). Meningitis can cause deafness and brain damage, and it can be fatal. Anyone can get pneumococcal disease, but children under 60 years of age, people with certain medical conditions, adults over 42 years of age, and cigarette smokers are at the highest risk. About 18,000 older adults die each year from pneumococcal disease in the [place]. Treatment of pneumococcal infections with penicillin and other drugs used to be more effective. But some strains of the disease have become resistant to these drugs. This makes prevention of the disease, through vaccination, even more important. 2. Pneumococcal polysaccharide vaccine (PPSV23) Pneumococcal polysaccharide vaccine (PPSV23) protects against 23 types of pneumococcal bacteria. It will not prevent all pneumococcal disease. PPSV23 is recommended for:  All adults 58 years of age and older,  Anyone 2 through 38 years of age with certain long-term health problems,  Anyone 2 through 38 years of age with a weakened immune system,  Adults 77 through 38 years of age who smoke cigarettes or have asthma. Most people need only one dose of PPSV. A second dose is recommended for certain high-risk groups. People 53 and older should get a dose even if they have gotten one or more doses of the vaccine before they turned 65. Your healthcare provider can give you more information about these recommendations. Most  healthy adults develop protection within 2 to 3 weeks of getting the shot. 3. Some people should not get this vaccine  Anyone who has had a life-threatening allergic reaction to PPSV should not get another dose.  Anyone who has a severe allergy to any component of PPSV should not receive it. Tell your provider if you have any severe allergies.  Anyone who is moderately or severely ill when the shot is scheduled may be asked to wait until they recover before getting the vaccine. Someone with a mild illness can usually be vaccinated.  Children less than 37 years of age should not receive this vaccine.  There is no evidence that PPSV is harmful to either a pregnant woman or to her fetus. However, as a precaution, women who need the vaccine should be vaccinated before becoming pregnant, if possible. 4. Risks of a vaccine reaction With any medicine, including vaccines, there is a chance of side effects. These are usually mild and go away on their own, but serious reactions are also possible. About half of people who get PPSV have mild side effects, such as redness or pain where the shot is given, which go away within about two days. Less than 1 out of 100 people develop a fever, muscle aches, or more severe local reactions. Problems that could happen after any vaccine:  People sometimes faint after a medical procedure, including vaccination. Sitting or lying down for about 15 minutes can help prevent fainting, and injuries caused by a fall. Tell your doctor if you feel dizzy, or have vision changes or ringing in the ears.  Some people get severe pain in the shoulder and have  difficulty moving the arm where a shot was given. This happens very rarely.  Any medication can cause a severe allergic reaction. Such reactions from a vaccine are very rare, estimated at about 1 in a million doses, and would happen within a few minutes to a few hours after the vaccination. As with any medicine, there is a very  remote chance of a vaccine causing a serious injury or death. The safety of vaccines is always being monitored. For more information, visit: http://www.aguilar.org/ 5. What if there is a serious reaction? What should I look for? Look for anything that concerns you, such as signs of a severe allergic reaction, very high fever, or unusual behavior.  Signs of a severe allergic reaction can include hives, swelling of the face and throat, difficulty breathing, a fast heartbeat, dizziness, and weakness. These would usually start a few minutes to a few hours after the vaccination. What should I do? If you think it is a severe allergic reaction or other emergency that can't wait, call 9-1-1 or get to the nearest hospital. Otherwise, call your doctor. Afterward, the reaction should be reported to the Vaccine Adverse Event Reporting System (VAERS). Your doctor might file this report, or you can do it yourself through the VAERS web site at www.vaers.SamedayNews.es, or by calling (458)695-5398.  VAERS does not give medical advice. 6. How can I learn more?  Ask your doctor. He or she can give you the vaccine package insert or suggest other sources of information.  Call your local or state health department.  Contact the Centers for Disease Control and Prevention (CDC):  Call (765)781-9160 (1-800-CDC-INFO) or  Visit CDC's website at http://hunter.com/ CDC Pneumococcal Polysaccharide Vaccine VIS (01/23/14)   This information is not intended to replace advice given to you by your health care provider. Make sure you discuss any questions you have with your health care provider.   Document Released: 07/16/2006 Document Revised: 10/09/2014 Document Reviewed: 01/26/2014 Elsevier Interactive Patient Education 2016 Elsevier Inc. Influenza Virus Vaccine injection (Fluarix) What is this medicine? INFLUENZA VIRUS VACCINE (in floo EN zuh VAHY ruhs vak SEEN) helps to reduce the risk of getting influenza also known  as the flu. This medicine may be used for other purposes; ask your health care provider or pharmacist if you have questions. What should I tell my health care provider before I take this medicine? They need to know if you have any of these conditions: -bleeding disorder like hemophilia -fever or infection -Guillain-Barre syndrome or other neurological problems -immune system problems -infection with the human immunodeficiency virus (HIV) or AIDS -low blood platelet counts -multiple sclerosis -an unusual or allergic reaction to influenza virus vaccine, eggs, chicken proteins, latex, gentamicin, other medicines, foods, dyes or preservatives -pregnant or trying to get pregnant -breast-feeding How should I use this medicine? This vaccine is for injection into a muscle. It is given by a health care professional. A copy of Vaccine Information Statements will be given before each vaccination. Read this sheet carefully each time. The sheet may change frequently. Talk to your pediatrician regarding the use of this medicine in children. Special care may be needed. Overdosage: If you think you have taken too much of this medicine contact a poison control center or emergency room at once. NOTE: This medicine is only for you. Do not share this medicine with others. What if I miss a dose? This does not apply. What may interact with this medicine? -chemotherapy or radiation therapy -medicines that lower your immune system  like etanercept, anakinra, infliximab, and adalimumab -medicines that treat or prevent blood clots like warfarin -phenytoin -steroid medicines like prednisone or cortisone -theophylline -vaccines This list may not describe all possible interactions. Give your health care provider a list of all the medicines, herbs, non-prescription drugs, or dietary supplements you use. Also tell them if you smoke, drink alcohol, or use illegal drugs. Some items may interact with your medicine. What  should I watch for while using this medicine? Report any side effects that do not go away within 3 days to your doctor or health care professional. Call your health care provider if any unusual symptoms occur within 6 weeks of receiving this vaccine. You may still catch the flu, but the illness is not usually as bad. You cannot get the flu from the vaccine. The vaccine will not protect against colds or other illnesses that may cause fever. The vaccine is needed every year. What side effects may I notice from receiving this medicine? Side effects that you should report to your doctor or health care professional as soon as possible: -allergic reactions like skin rash, itching or hives, swelling of the face, lips, or tongue Side effects that usually do not require medical attention (report to your doctor or health care professional if they continue or are bothersome): -fever -headache -muscle aches and pains -pain, tenderness, redness, or swelling at site where injected -weak or tired This list may not describe all possible side effects. Call your doctor for medical advice about side effects. You may report side effects to FDA at 1-800-FDA-1088. Where should I keep my medicine? This vaccine is only given in a clinic, pharmacy, doctor's office, or other health care setting and will not be stored at home. NOTE: This sheet is a summary. It may not cover all possible information. If you have questions about this medicine, talk to your doctor, pharmacist, or health care provider.    2016, Elsevier/Gold Standard. (2008-04-15 09:30:40)   - DASH Eating Plan DASH stands for "Dietary Approaches to Stop Hypertension." The DASH eating plan is a healthy eating plan that has been shown to reduce high blood pressure (hypertension). Additional health benefits may include reducing the risk of type 2 diabetes mellitus, heart disease, and stroke. The DASH eating plan may also help with weight loss. WHAT DO I NEED  TO KNOW ABOUT THE DASH EATING PLAN? For the DASH eating plan, you will follow these general guidelines:  Choose foods with a percent daily value for sodium of less than 5% (as listed on the food label).  Use salt-free seasonings or herbs instead of table salt or sea salt.  Check with your health care provider or pharmacist before using salt substitutes.  Eat lower-sodium products, often labeled as "lower sodium" or "no salt added."  Eat fresh foods.  Eat more vegetables, fruits, and low-fat dairy products.  Choose whole grains. Look for the word "whole" as the first word in the ingredient list.  Choose fish and skinless chicken or [place] more often than red meat. Limit fish, poultry, and meat to 6 oz (170 g) each day.  Limit sweets, desserts, sugars, and sugary drinks.  Choose heart-healthy fats.  Limit cheese to 1 oz (28 g) per day.  Eat more home-cooked food and less restaurant, buffet, and fast food.  Limit fried foods.  Cook foods using methods other than frying.  Limit canned vegetables. If you do use them, rinse them well to decrease the sodium.  When eating at a restaurant, ask  that your food be prepared with less salt, or no salt if possible. WHAT FOODS CAN I EAT? Seek help from a dietitian for individual calorie needs. Grains Whole grain or whole wheat bread. Brown rice. Whole grain or whole wheat pasta. Quinoa, bulgur, and whole grain cereals. Low-sodium cereals. Corn or whole wheat flour tortillas. Whole grain cornbread. Whole grain crackers. Low-sodium crackers. Vegetables Fresh or frozen vegetables (raw, steamed, roasted, or grilled). Low-sodium or reduced-sodium tomato and vegetable juices. Low-sodium or reduced-sodium tomato sauce and paste. Low-sodium or reduced-sodium canned vegetables.  Fruits All fresh, canned (in natural juice), or frozen fruits. Meat and Other Protein Products Ground beef (85% or leaner), grass-fed beef, or beef trimmed of fat. Skinless  chicken or [place]. Ground chicken or [place]. Pork trimmed of fat. All fish and seafood. Eggs. Dried beans, peas, or lentils. Unsalted nuts and seeds. Unsalted canned beans. Dairy Low-fat dairy products, such as skim or 1% milk, 2% or reduced-fat cheeses, low-fat ricotta or cottage cheese, or plain low-fat yogurt. Low-sodium or reduced-sodium cheeses. Fats and Oils Tub margarines without trans fats. Light or reduced-fat mayonnaise and salad dressings (reduced sodium). Avocado. Safflower, olive, or canola oils. Natural peanut or almond butter. Other Unsalted popcorn and pretzels. The items listed above may not be a complete list of recommended foods or beverages. Contact your dietitian for more options. WHAT FOODS ARE NOT RECOMMENDED? Grains White bread. White pasta. White rice. Refined cornbread. Bagels and croissants. Crackers that contain trans fat. Vegetables Creamed or fried vegetables. Vegetables in a cheese sauce. Regular canned vegetables. Regular canned tomato sauce and paste. Regular tomato and vegetable juices. Fruits Dried fruits. Canned fruit in light or heavy syrup. Fruit juice. Meat and Other Protein Products Fatty cuts of meat. Ribs, chicken wings, bacon, sausage, bologna, salami, chitterlings, fatback, hot dogs, bratwurst, and packaged luncheon meats. Salted nuts and seeds. Canned beans with salt. Dairy Whole or 2% milk, cream, half-and-half, and cream cheese. Whole-fat or sweetened yogurt. Full-fat cheeses or blue cheese. Nondairy creamers and whipped toppings. Processed cheese, cheese spreads, or cheese curds. Condiments Onion and garlic salt, seasoned salt, table salt, and sea salt. Canned and packaged gravies. Worcestershire sauce. Tartar sauce. Barbecue sauce. Teriyaki sauce. Soy sauce, including reduced sodium. Steak sauce. Fish sauce. Oyster sauce. Cocktail sauce. Horseradish. Ketchup and mustard. Meat flavorings and tenderizers. Bouillon cubes. Hot sauce. Tabasco sauce.  Marinades. Taco seasonings. Relishes. Fats and Oils Butter, stick margarine, lard, shortening, ghee, and bacon fat. Coconut, palm kernel, or palm oils. Regular salad dressings. Other Pickles and olives. Salted popcorn and pretzels. The items listed above may not be a complete list of foods and beverages to avoid. Contact your dietitian for more information. WHERE CAN I FIND MORE INFORMATION? National Heart, Lung, and Blood Institute: travelstabloid.com   This information is not intended to replace advice given to you by your health care provider. Make sure you discuss any questions you have with your health care provider.   Document Released: 09/07/2011 Document Revised: 10/09/2014 Document Reviewed: 07/23/2013 Elsevier Interactive Patient Education 2016 Elsevier Inc.    -  Hypertension Hypertension is another name for high blood pressure. High blood pressure forces your heart to work harder to pump blood. A blood pressure reading has two numbers, which includes a higher number over a lower number (example: 110/72). HOME CARE   Have your blood pressure rechecked by your doctor.  Only take medicine as told by your doctor. Follow the directions carefully. The medicine does not work as well if  you skip doses. Skipping doses also puts you at risk for problems.  Do not smoke.  Monitor your blood pressure at home as told by your doctor. GET HELP IF:  You think you are having a reaction to the medicine you are taking.  You have repeat headaches or feel dizzy.  You have puffiness (swelling) in your ankles.  You have trouble with your vision. GET HELP RIGHT AWAY IF:   You get a very bad headache and are confused.  You feel weak, numb, or faint.  You get chest or belly (abdominal) pain.  You throw up (vomit).  You cannot breathe very well. MAKE SURE YOU:   Understand these instructions.  Will watch your condition.  Will get help right  away if you are not doing well or get worse.   This information is not intended to replace advice given to you by your health care provider. Make sure you discuss any questions you have with your health care provider.   Document Released: 03/06/2008 Document Revised: 09/23/2013 Document Reviewed: 07/11/2013 Elsevier Interactive Patient Education 2016 Elsevier Inc.   - High-Fiber Diet Fiber, also called dietary fiber, is a type of carbohydrate found in fruits, vegetables, whole grains, and beans. A high-fiber diet can have many health benefits. Your health care provider may recommend a high-fiber diet to help:  Prevent constipation. Fiber can make your bowel movements more regular.  Lower your cholesterol.  Relieve hemorrhoids, uncomplicated diverticulosis, or irritable bowel syndrome.  Prevent overeating as part of a weight-loss plan.  Prevent heart disease, type 2 diabetes, and certain cancers. WHAT IS MY PLAN? The recommended daily intake of fiber includes:  38 grams for men under age 71.  28 grams for men over age 36.  7 grams for women under age 50.  68 grams for women over age 43. You can get the recommended daily intake of dietary fiber by eating a variety of fruits, vegetables, grains, and beans. Your health care provider may also recommend a fiber supplement if it is not possible to get enough fiber through your diet. WHAT DO I NEED TO KNOW ABOUT A HIGH-FIBER DIET?  Fiber supplements have not been widely studied for their effectiveness, so it is better to get fiber through food sources.  Always check the fiber content on thenutrition facts label of any prepackaged food. Look for foods that contain at least 5 grams of fiber per serving.  Ask your dietitian if you have questions about specific foods that are related to your condition, especially if those foods are not listed in the following section.  Increase your daily fiber consumption gradually. Increasing your  intake of dietary fiber too quickly may cause bloating, cramping, or gas.  Drink plenty of water. Water helps you to digest fiber. WHAT FOODS CAN I EAT? Grains Whole-grain breads. Multigrain cereal. Oats and oatmeal. Brown rice. Barley. Bulgur wheat. [place]. Bran muffins. Popcorn. Rye wafer crackers. Vegetables Sweet potatoes. Spinach. Kale. Artichokes. Cabbage. Broccoli. Green peas. Carrots. Squash. Fruits Berries. Pears. Apples. Oranges. Avocados. Prunes and raisins. Dried figs. Meats and Other Protein Sources [place], kidney, pinto, and soy beans. Split peas. Lentils. Nuts and seeds. Dairy Fiber-fortified yogurt. Beverages Fiber-fortified soy milk. Fiber-fortified orange juice. Other Fiber bars. The items listed above may not be a complete list of recommended foods or beverages. Contact your dietitian for more options. WHAT FOODS ARE NOT RECOMMENDED? Grains White bread. Pasta made with refined flour. White rice. Vegetables Fried potatoes. Canned vegetables. Well-cooked vegetables.  Fruits  Fruit juice. Cooked, strained fruit. Meats and Other Protein Sources Fatty cuts of meat. Fried Sales executive or fried fish. Dairy Milk. Yogurt. Cream cheese. Sour cream. Beverages Soft drinks. Other Cakes and pastries. Butter and oils. The items listed above may not be a complete list of foods and beverages to avoid. Contact your dietitian for more information. WHAT ARE SOME TIPS FOR INCLUDING HIGH-FIBER FOODS IN MY DIET?  Eat a wide variety of high-fiber foods.  Make sure that half of all grains consumed each day are whole grains.  Replace breads and cereals made from refined flour or white flour with whole-grain breads and cereals.  Replace white rice with brown rice, bulgur wheat, or millet.  Start the day with a breakfast that is high in fiber, such as a cereal that contains at least 5 grams of fiber per serving.  Use beans in place of meat in soups, salads, or pasta.  Eat high-fiber  snacks, such as berries, raw vegetables, nuts, or popcorn.   This information is not intended to replace advice given to you by your health care provider. Make sure you discuss any questions you have with your health care provider.   Document Released: 09/18/2005 Document Revised: 10/09/2014 Document Reviewed: 03/03/2014 Elsevier Interactive Patient Education [employer].

## 2016-05-24 NOTE — Progress Notes (Signed)
Eric Peck, is a 40 y.o. male  [id number]  [id number]  DOB - 06/15/1976  CC:  Chief Complaint  Patient presents with  . Abdominal Pain       HPI: Eric Peck is a 40 y.o. male here today to establish medical care, with history of chronic right upper quadrant abdominal pain, diverticulosis, as well as tobacco abuse.  Patient states he use to smoke 2 packs a day but now down to about 5 cigarettes a day. He says his abdominal pain is much improved. However, he still does get intermittent "twinges" in his right upper quadrant, especially when he is physically active, trying to lift something or during sexual intercourse. The pain worsens.  Bentyl helped considerably for pain, but ran out of the rx.  His bowel movements typically are normal. He does have periods of 3-4 days of constipation, straining, but with no defecation. States he is on a high-fiber diet and has been taking Colace prior without much results either. Denies diarrhea. [place] Activia yogurt once daily as well as a form of probiotic.  He eats very little meat, and almost a pure vegan per pt.  Patient has No headache, No chest pain, No abdominal pain - No Nausea, No new weakness tingling or numbness, No Cough - SOB.    Review of Systems: Per HPI, o/w all systems reviewed and negative.  No Known Allergies Past Medical History:  Diagnosis Date  . Arthritis   . CTS (carpal tunnel syndrome)   . Diverticulitis of large intestine with perforation 03/2015   microperf of sigmoid colitis, tx with abx, no surg   Current Outpatient Prescriptions on File Prior to Visit  Medication Sig Dispense Refill  . ibuprofen (ADVIL,MOTRIN) 200 MG tablet Take 400-800 mg by mouth every 6 (six) hours as needed. Reported on 04/12/2016    . ondansetron (ZOFRAN) 4 MG tablet Take 1 tablet (4 mg total) by mouth every 6 (six) hours as needed for nausea or vomiting. 10 tablet 0   No current facility-administered medications on file prior  to visit.    Family History  Problem Relation Age of Onset  . COPD Mother   . Drug abuse Mother     EtOH  . Diabetes Father   . Kidney disease Father   . Eczema Brother   . Mental retardation Brother     Down's Syndrome   Social History   Social History  . Marital status: Married    Spouse name: N/A  . Number of children: N/A  . Years of education: N/A   Occupational History  . Not on file.   Social History Main Topics  . Smoking status: Current Every Day Smoker    Packs/day: 1.00    Types: Cigarettes  . Smokeless tobacco: Never Used  . Alcohol use No  . Drug use: No  . Sexual activity: Not on file   Other Topics Concern  . Not on file   Social History Narrative  . No narrative on file    Objective:   Vitals:   05/24/16 1029  BP: (!) 144/85  Pulse: 66  Temp: 98.4 F (36.9 C)    Filed Weights   05/24/16 1029  Weight: 224 lb 9.6 oz (101.9 kg)    BP Readings from Last 3 Encounters:  05/24/16 (!) 144/85  04/12/16 136/83  02/19/16 125/85    Physical Exam: Constitutional: Patient appears well-developed and well-nourished. No distress. AAOx3, pleasant HENT: Normocephalic, atraumatic, External right and left ear normal.  Oropharynx is clear and moist.  bilat TMs clear. No masses noted in his right ear (which he thought he felt few days ago, gone now.) Eyes: Conjunctivae and EOM are normal. PERRL, no scleral icterus. Neck: Normal ROM. Neck supple. No JVD.  CVS: RRR, S1/S2 +, no murmurs, no gallops, no carotid bruit.  Pulmonary: Effort and breath sounds normal, no stridor, rhonchi, wheezes, rales.  Abdominal: Soft. BS +, no distension, tenderness, rebound or guarding. No hsm. No palpable masses. Musculoskeletal: Normal range of motion. No edema and no tenderness.  LE: bilat/ no c/c/e, pulses 2+ bilateral. Neuro: Alert. muscle tone coordination wnl. No cranial nerve deficit grossly. Skin: Skin is warm and dry. No rash noted. Not diaphoretic. No erythema. No  pallor. Psychiatric: Normal mood and affect. Behavior, judgment, thought content normal.  Lab Results  Component Value Date   WBC 9.6 02/19/2016   HGB 16.5 02/19/2016   HCT 45.9 02/19/2016   MCV 83.8 02/19/2016   PLT 226 02/19/2016   Lab Results  Component Value Date   CREATININE 0.95 02/19/2016   BUN 13 02/19/2016   NA 139 02/19/2016   K 3.4 (L) 02/19/2016   CL 109 02/19/2016   CO2 20 (L) 02/19/2016    No results found for: HGBA1C Lipid Panel  No results found for: CHOL, TRIG, HDL, CHOLHDL, VLDL, LDLCALC     Depression screen [facility] 2/9 05/24/2016 04/12/2016  Decreased Interest 0 2  Down, Depressed, Hopeless 1 2  PHQ - 2 Score 1 4  Altered sleeping 3 2  Tired, decreased energy 3 3  Change in appetite 3 3  Feeling bad or failure about yourself  3 -  Trouble concentrating 3 2  Moving slowly or fidgety/restless 1 1  Suicidal thoughts 0 2  PHQ-9 Score 17 17    Assessment and plan:   1. Chronic RUQ pain, due to constipation? Biliary dyskinesia? - HIDA negative. - prior CT w/ diverticulosis. - recd high fiber diet, add stool softeners as needed, ground flax seed a good source of fiber as well. - recd probiotics /Activia 2x day for now - prior colonoscopy 2016 with diverticulosis. - bentyl prn for now. - Ambulatory referral to Gastroenterology  2. Flu vaccine need - Flu Vaccine QUAD 36+ mos PF IM (Fluarix & Fluzone Quad PF)  3. Constipation, unspecified constipation type, ibs? Biliary dyskinesia? - Ambulatory referral to Gastroenterology  4. Diverticulosis of colon without hemorrhage Recd high fiber diet, and avoid small nuts and seeds. Daily bm best.  5. Tobacco abuse Complete cessation recd. - pneumococcal 23v vaccine today.   Return in about 4 weeks (around 06/21/2016) for htn.  The patient was given clear instructions to go to ER or return to medical center if symptoms don't improve, worsen or new problems develop. The patient verbalized understanding. The  patient was told to call to get lab results if they haven't heard anything in the next week.    This note has been created with Surveyor, quantity. Any transcriptional errors are unintentional.   Maren Reamer, MD, [place], [place]   05/24/2016, 12:38 PM

## 2016-05-25 ENCOUNTER — Encounter: Payer: Self-pay | Admitting: Internal Medicine

## 2016-06-26 ENCOUNTER — Ambulatory Visit: Payer: Self-pay | Attending: Internal Medicine | Admitting: Internal Medicine

## 2016-06-26 ENCOUNTER — Encounter: Payer: Self-pay | Admitting: Internal Medicine

## 2016-06-26 VITALS — BP 146/91 | HR 64 | Temp 98.4°F | Resp 16 | Wt 227.0 lb

## 2016-06-26 DIAGNOSIS — F329 Major depressive disorder, single episode, unspecified: Secondary | ICD-10-CM | POA: Insufficient documentation

## 2016-06-26 DIAGNOSIS — G4700 Insomnia, unspecified: Secondary | ICD-10-CM | POA: Insufficient documentation

## 2016-06-26 DIAGNOSIS — G5600 Carpal tunnel syndrome, unspecified upper limb: Secondary | ICD-10-CM | POA: Insufficient documentation

## 2016-06-26 DIAGNOSIS — M1990 Unspecified osteoarthritis, unspecified site: Secondary | ICD-10-CM | POA: Insufficient documentation

## 2016-06-26 DIAGNOSIS — K5732 Diverticulitis of large intestine without perforation or abscess without bleeding: Secondary | ICD-10-CM | POA: Insufficient documentation

## 2016-06-26 DIAGNOSIS — F32A Depression, unspecified: Secondary | ICD-10-CM

## 2016-06-26 DIAGNOSIS — K5900 Constipation, unspecified: Secondary | ICD-10-CM | POA: Insufficient documentation

## 2016-06-26 DIAGNOSIS — Z23 Encounter for immunization: Secondary | ICD-10-CM

## 2016-06-26 DIAGNOSIS — I10 Essential (primary) hypertension: Secondary | ICD-10-CM | POA: Insufficient documentation

## 2016-06-26 DIAGNOSIS — K219 Gastro-esophageal reflux disease without esophagitis: Secondary | ICD-10-CM | POA: Insufficient documentation

## 2016-06-26 DIAGNOSIS — R1011 Right upper quadrant pain: Secondary | ICD-10-CM | POA: Insufficient documentation

## 2016-06-26 DIAGNOSIS — R1010 Upper abdominal pain, unspecified: Secondary | ICD-10-CM

## 2016-06-26 DIAGNOSIS — Z720 Tobacco use: Secondary | ICD-10-CM

## 2016-06-26 DIAGNOSIS — G8929 Other chronic pain: Secondary | ICD-10-CM

## 2016-06-26 MED ORDER — PANTOPRAZOLE SODIUM 40 MG PO TBEC: 40.0000 mg | DELAYED_RELEASE_TABLET | Freq: Every day | ORAL | 3 refills | Status: DC

## 2016-06-26 MED ORDER — DICYCLOMINE HCL 20 MG PO TABS: 20.0000 mg | ORAL_TABLET | Freq: Three times a day (TID) | ORAL | 1 refills | Status: DC

## 2016-06-26 MED ORDER — AMITRIPTYLINE HCL 25 MG PO TABS: 25.0000 mg | ORAL_TABLET | Freq: Every day | ORAL | 0 refills | Status: DC

## 2016-06-26 MED ORDER — HYDROCHLOROTHIAZIDE 12.5 MG PO CAPS: 12.5000 mg | ORAL_CAPSULE | Freq: Every day | ORAL | 2 refills | Status: DC

## 2016-06-26 MED FILL — ?DICYCLOMINE 20 MG TABLET: 20 | 15 days supply | Qty: 45 | Fill #0

## 2016-06-26 MED FILL — ?HYDROCHLOROTHIAZIDE 12.5MG: 12.5 | 30 days supply | Qty: 30 | Fill #0

## 2016-06-26 MED FILL — AMITRIPTYLINE HCL 25 MG TAB: 25 | 30 days supply | Qty: 30 | Fill #0

## 2016-06-26 MED FILL — PANTOPRAZOLE SOD DR 40 MG T: 40 | 30 days supply | Qty: 30 | Fill #0

## 2016-06-26 NOTE — Progress Notes (Signed)
Eric Peck, is a 40 y.o. male  [id number]  [id number]  DOB - 06-19-1976  Chief Complaint  Patient presents with  . Follow-up        Subjective:   Eric Peck is a 40 y.o. male here today for a follow up visit for chronic ruq abd pain, constipation.  Last seen in clinic 8/23.  activia and ground flax seed has helped his constipation tremendously.  He states still w/ significant abd pain, and bentyl has helped take edge off pain.  Smoking less now, down to 2 cigs /day.  Lots of stress at home (teenage dgt), unemployed currently, depressed about chronic abd pains. Not sleeping well. Denies si/hi/avh currently though. Amendable to trying something, does not want anything addicting. Strong family hx of addiction, not him personally though.  Patient has No headache, No chest pain, No Nausea, No new weakness tingling or numbness, No Cough - SOB.  No problems updated.  ALLERGIES: No Known Allergies  PAST MEDICAL HISTORY: Past Medical History:  Diagnosis Date  . Arthritis   . CTS (carpal tunnel syndrome)   . Diverticulitis of large intestine with perforation 03/2015   microperf of sigmoid colitis, tx with abx, no surg    MEDICATIONS AT HOME: Prior to Admission medications   Medication Sig Start Date End Date Taking? Authorizing Provider  ibuprofen (ADVIL,MOTRIN) 200 MG tablet Take 400-800 mg by mouth every 6 (six) hours as needed. Reported on 04/12/2016   Yes Historical Provider, MD  ondansetron (ZOFRAN) 4 MG tablet Take 1 tablet (4 mg total) by mouth every 6 (six) hours as needed for nausea or vomiting. 04/12/16  Yes Rowe Clack, MD  amitriptyline (ELAVIL) 25 MG tablet Take 1 tablet (25 mg total) by mouth at bedtime. 06/26/16   Maren Reamer, MD  dicyclomine (BENTYL) 20 MG tablet Take 1 tablet (20 mg total) by mouth 3 (three) times daily before meals. 06/26/16   Maren Reamer, MD  hydrochlorothiazide (MICROZIDE) 12.5 MG capsule Take 1 capsule (12.5 mg total)  by mouth daily. 06/26/16   Maren Reamer, MD  pantoprazole (PROTONIX) 40 MG tablet Take 1 tablet (40 mg total) by mouth daily. 06/26/16   Maren Reamer, MD     Objective:   Vitals:   06/26/16 1012  BP: (!) 146/91  Pulse: 64  Resp: 16  Temp: 98.4 F (36.9 C)  TempSrc: Oral  SpO2: 98%  Weight: 227 lb (103 kg)    Exam General appearance : Awake, alert, not in any distress. Speech Clear. Not toxic looking, pleasant HEENT: Atraumatic and Normocephalic, pupils equally reactive to light. Neck: supple, no JVD.  Chest:Good air entry bilaterally, no added sounds. CVS: S1 S2 regular, no murmurs/gallups or rubs. Abdomen: Bowel sounds active, Non tender currently. Not distended with no gaurding, rigidity or rebound. Extremities: B/L Lower Ext shows no edema, both legs are warm to touch Neurology: Awake alert, and oriented X 3, CN II-XII grossly intact, Non focal Skin:No Rash  Data Review No results found for: HGBA1C  Depression screen [facility] 2/9 06/26/2016 05/24/2016 04/12/2016  Decreased Interest 3 0 2  Down, Depressed, Hopeless 3 1 2   PHQ - 2 Score 6 1 4   Altered sleeping 3 3 2   Tired, decreased energy 3 3 3   Change in appetite 3 3 3   Feeling bad or failure about yourself  3 3 -  Trouble concentrating 3 3 2   Moving slowly or fidgety/restless 0 1 1  Suicidal thoughts 3 0 2  PHQ-9 Score 24 17 17       Assessment & Plan   1. HTN (hypertension), benign Persistently elevated, may be 2nd to pain, but will start hctz 12.5 qday today. Dash/low salt diet discussed  2. Chronic RUQ pain Emp ppi Bentyl prn renewed since was helping Continue activit/probiotics and ground flax seed to help w/ constipation - which has improved greatly. Has gi appt 10/23.  3. Tobacco abuse continue recd cessation, may irritate gerd if has  4. Depression, multifactorial, w/ insomnia Trial amitriptyline 25 qhs.   Patient have been counseled extensively about nutrition and exercise  Return in  about 3 months (around 09/25/2016).  The patient was given clear instructions to go to ER or return to medical center if symptoms don't improve, worsen or new problems develop. The patient verbalized understanding. The patient was told to call to get lab results if they haven't heard anything in the next week.   This note has been created with Surveyor, quantity. Any transcriptional errors are unintentional.   Maren Reamer, MD, [place] [facility] [place], [place]   06/26/2016, 10:25 AM

## 2016-06-26 NOTE — Patient Instructions (Addendum)
Gastroesophageal Reflux Disease, Adult Normally, food travels down the esophagus and stays in the stomach to be digested. If a person has gastroesophageal reflux disease (GERD), food and stomach acid move back up into the esophagus. When this happens, the esophagus becomes sore and swollen (inflamed). Over time, GERD can make small holes (ulcers) in the lining of the esophagus. HOME CARE Diet  Follow a diet as told by your doctor. You may need to avoid foods and drinks such as:  Coffee and tea (with or without caffeine).  Drinks that contain alcohol.  Energy drinks and sports drinks.  Carbonated drinks or sodas.  Chocolate and cocoa.  Peppermint and mint flavorings.  Garlic and onions.  Horseradish.  Spicy and acidic foods, such as peppers, chili powder, curry powder, vinegar, hot sauces, and BBQ sauce.  Citrus fruit juices and citrus fruits, such as oranges, lemons, and limes.  Tomato-based foods, such as red sauce, chili, salsa, and pizza with red sauce.  Fried and fatty foods, such as donuts, french fries, potato chips, and high-fat dressings.  High-fat meats, such as hot dogs, rib eye steak, sausage, ham, and bacon.  High-fat dairy items, such as whole milk, butter, and cream cheese.  Eat small meals often. Avoid eating large meals.  Avoid drinking large amounts of liquid with your meals.  Avoid eating meals during the 2-3 hours before bedtime.  Avoid lying down right after you eat.  Do not exercise right after you eat. General Instructions  Pay attention to any changes in your symptoms.  Take over-the-counter and prescription medicines only as told by your doctor. Do not take aspirin, ibuprofen, or other NSAIDs unless your doctor says it is okay.  Do not use any tobacco products, including cigarettes, chewing tobacco, and e-cigarettes. If you need help quitting, ask your doctor.  Wear loose clothes. Do not wear anything tight around your waist.  Raise  (elevate) the head of your bed about 6 inches (15 cm).  Try to lower your stress. If you need help doing this, ask your doctor.  If you are overweight, lose an amount of weight that is healthy for you. Ask your doctor about a safe weight loss goal.  Keep all follow-up visits as told by your doctor. This is important. GET HELP IF:  You have new symptoms.  You lose weight and you do not know why it is happening.  You have trouble swallowing, or it hurts to swallow.  You have wheezing or a cough that keeps happening.  Your symptoms do not get better with treatment.  You have a hoarse voice. GET HELP RIGHT AWAY IF:  You have pain in your arms, neck, jaw, teeth, or back.  You feel sweaty, dizzy, or light-headed.  You have chest pain or shortness of breath.  You throw up (vomit) and your throw up looks like blood or coffee grounds.  You pass out (faint).  Your poop (stool) is bloody or black.  You cannot swallow, drink, or eat.   This information is not intended to replace advice given to you by your health care provider. Make sure you discuss any questions you have with your health care provider.   Document Released: 03/06/2008 Document Revised: 06/09/2015 Document Reviewed: 01/13/2015 Elsevier Interactive Patient Education 2016 Elsevier Inc.  - Insomnia Insomnia is a sleep disorder that makes it difficult to fall asleep or to stay asleep. Insomnia can cause tiredness (fatigue), low energy, difficulty concentrating, mood swings, and poor performance at work or school.  There are three different ways to classify insomnia:  Difficulty falling asleep.  Difficulty staying asleep.  Waking up too early in the morning. Any type of insomnia can be long-term (chronic) or short-term (acute). Both are common. Short-term insomnia usually lasts for three months or less. Chronic insomnia occurs at least three times a week for longer than three months. CAUSES  Insomnia may be  caused by another condition, situation, or substance, such as:  Anxiety.  Certain medicines.  Gastroesophageal reflux disease (GERD) or other gastrointestinal conditions.  Asthma or other breathing conditions.  Restless legs syndrome, sleep apnea, or other sleep disorders.  Chronic pain.  Menopause. This may include hot flashes.  Stroke.  Abuse of alcohol, tobacco, or illegal drugs.  Depression.  Caffeine.   Neurological disorders, such as Alzheimer disease.  An overactive thyroid (hyperthyroidism). The cause of insomnia may not be known. RISK FACTORS Risk factors for insomnia include:  Gender. Women are more commonly affected than men.  Age. Insomnia is more common as you get older.  Stress. This may involve your professional or personal life.  Income. Insomnia is more common in people with lower income.  Lack of exercise.   Irregular work schedule or night shifts.  Traveling between different time zones. SIGNS AND SYMPTOMS If you have insomnia, trouble falling asleep or trouble staying asleep is the main symptom. This may lead to other symptoms, such as:  Feeling fatigued.  Feeling nervous about going to sleep.  Not feeling rested in the morning.  Having trouble concentrating.  Feeling irritable, anxious, or depressed. TREATMENT  Treatment for insomnia depends on the cause. If your insomnia is caused by an underlying condition, treatment will focus on addressing the condition. Treatment may also include:   Medicines to help you sleep.  Counseling or therapy.  Lifestyle adjustments. HOME CARE INSTRUCTIONS   Take medicines only as directed by your health care provider.  Keep regular sleeping and waking hours. Avoid naps.  Keep a sleep diary to help you and your health care provider figure out what could be causing your insomnia. Include:   When you sleep.  When you wake up during the night.  How well you sleep.   How rested you feel  the next day.  Any side effects of medicines you are taking.  What you eat and drink.   Make your bedroom a comfortable place where it is easy to fall asleep:  Put up shades or special blackout curtains to block light from outside.  Use a white noise machine to block noise.  Keep the temperature cool.   Exercise regularly as directed by your health care provider. Avoid exercising right before bedtime.  Use relaxation techniques to manage stress. Ask your health care provider to suggest some techniques that may work well for you. These may include:  Breathing exercises.  Routines to release muscle tension.  Visualizing peaceful scenes.  Cut back on alcohol, caffeinated beverages, and cigarettes, especially close to bedtime. These can disrupt your sleep.  Do not overeat or eat spicy foods right before bedtime. This can lead to digestive discomfort that can make it hard for you to sleep.  Limit screen use before bedtime. This includes:  Watching TV.  Using your smartphone, tablet, and computer.  Stick to a routine. This can help you fall asleep faster. Try to do a quiet activity, brush your teeth, and go to bed at the same time each night.  Get out of bed if you are still awake  after 15 minutes of trying to sleep. Keep the lights down, but try reading or doing a quiet activity. When you feel sleepy, go back to bed.  Make sure that you drive carefully. Avoid driving if you feel very sleepy.  Keep all follow-up appointments as directed by your health care provider. This is important. SEEK MEDICAL CARE IF:   You are tired throughout the day or have trouble in your daily routine due to sleepiness.  You continue to have sleep problems or your sleep problems get worse. SEEK IMMEDIATE MEDICAL CARE IF:   You have serious thoughts about hurting yourself or someone else.   This information is not intended to replace advice given to you by your health care provider. Make sure  you discuss any questions you have with your health care provider.   Document Released: 09/15/2000 Document Revised: 06/09/2015 Document Reviewed: 06/19/2014 Elsevier Interactive Patient Education 2016 [employer] Can Quit Smoking If you are ready to quit smoking or are thinking about it, congratulations! You have chosen to help yourself be healthier and live longer! There are lots of different ways to quit smoking. Nicotine gum, nicotine patches, a nicotine inhaler, or nicotine nasal spray can help with physical craving. Hypnosis, support groups, and medicines help break the habit of smoking. TIPS TO GET OFF AND STAY OFF CIGARETTES  Learn to predict your moods. Do not let a bad situation be your excuse to have a cigarette. Some situations in your life might tempt you to have a cigarette.  Ask friends and co-workers not to smoke around you.  Make your home smoke-free.  Never have "just one" cigarette. It leads to wanting another and another. Remind yourself of your decision to quit.  On a card, make a list of your reasons for not smoking. Read it at least the same number of times a day as you have a cigarette. Tell yourself everyday, "I do not want to smoke. I choose not to smoke."  Ask someone at home or work to help you with your plan to quit smoking.  Have something planned after you eat or have a cup of coffee. Take a walk or get other exercise to perk you up. This will help to keep you from overeating.  Try a relaxation exercise to calm you down and decrease your stress. Remember, you may be tense and nervous the first two weeks after you quit. This will pass.  Find new activities to keep your hands busy. Play with a pen, coin, or rubber band. Doodle or draw things on paper.  Brush your teeth right after eating. This will help cut down the craving for the taste of tobacco after meals. You can try mouthwash too.  Try gum, breath mints, or diet candy to keep something in  your mouth. IF YOU SMOKE AND WANT TO QUIT:  Do not stock up on cigarettes. Never buy a carton. Wait until one pack is finished before you buy another.  Never carry cigarettes with you at work or at home.  Keep cigarettes as far away from you as possible. Leave them with someone else.  Never carry matches or a lighter with you.  Ask yourself, "Do I need this cigarette or is this just a reflex?"  Bet with someone that you can quit. Put cigarette money in a piggy bank every morning. If you smoke, you give up the money. If you do not smoke, by the end of the week, you keep the  money.  Keep trying. It takes 21 days to change a habit!  Talk to your doctor about using medicines to help you quit. These include nicotine replacement gum, lozenges, or skin patches.   This information is not intended to replace advice given to you by your health care provider. Make sure you discuss any questions you have with your health care provider.   Document Released: 07/15/2009 Document Revised: 12/11/2011 Document Reviewed: 07/15/2009 Elsevier Interactive Patient Education 2016 [employer]. Tdap Vaccine (Tetanus, Diphtheria and Pertussis): What You Need to Know 1. Why get vaccinated? Tetanus, diphtheria and pertussis are very serious diseases. Tdap vaccine can protect [place] from these diseases. And, Tdap vaccine given to pregnant women can protect newborn babies against pertussis. TETANUS (Lockjaw) is rare in the [place] today. It causes painful muscle tightening and stiffness, usually all over the body.  It can lead to tightening of muscles in the head and neck so you can't open your mouth, swallow, or sometimes even breathe. Tetanus kills about 1 out of 10 people who are infected even after receiving the best medical care. DIPHTHERIA is also rare in the [place] today. It can cause a thick coating to form in the back of the throat.  It can lead to breathing problems, heart failure, paralysis,  and death. PERTUSSIS (Whooping Cough) causes severe coughing spells, which can cause difficulty breathing, vomiting and disturbed sleep.  It can also lead to weight loss, incontinence, and rib fractures. Up to 2 in 100 adolescents and 5 in 100 adults with pertussis are hospitalized or have complications, which could include pneumonia or death. These diseases are caused by bacteria. Diphtheria and pertussis are spread from person to person through secretions from coughing or sneezing. Tetanus enters the body through cuts, scratches, or wounds. Before vaccines, as many as 200,000 cases of diphtheria, 200,000 cases of pertussis, and hundreds of cases of tetanus, were reported in the [place] each year. Since vaccination began, reports of cases for tetanus and diphtheria have dropped by about 99% and for pertussis by about 80%. 2. Tdap vaccine Tdap vaccine can protect adolescents and adults from tetanus, diphtheria, and pertussis. One dose of Tdap is routinely given at age 70 or 62. People who did not get Tdap at that age should get it as soon as possible. Tdap is especially important for healthcare professionals and anyone having close contact with a baby younger than 12 months. Pregnant women should get a dose of Tdap during every pregnancy, to protect the newborn from pertussis. Infants are most at risk for severe, life-threatening complications from pertussis. Another vaccine, called Td, protects against tetanus and diphtheria, but not pertussis. A Td booster should be given every 10 years. Tdap may be given as one of these boosters if you have never gotten Tdap before. Tdap may also be given after a severe cut or burn to prevent tetanus infection. Your doctor or the person giving you the vaccine can give you more information. Tdap may safely be given at the same time as other vaccines. 3. Some people should not get this vaccine  A person who has ever had a life-threatening allergic reaction  after a previous dose of any diphtheria, tetanus or pertussis containing vaccine, OR has a severe allergy to any part of this vaccine, should not get Tdap vaccine. Tell the person giving the vaccine about any severe allergies.  Anyone who had coma or long repeated seizures within 7 days after a childhood dose of DTP or  DTaP, or a previous dose of Tdap, should not get Tdap, unless a cause other than the vaccine was found. They can still get Td.  Talk to your doctor if you:  have seizures or another nervous system problem,  had severe pain or swelling after any vaccine containing diphtheria, tetanus or pertussis,  ever had a condition called Guillain-Barr Syndrome (GBS),  aren't feeling well on the day the shot is scheduled. 4. Risks With any medicine, including vaccines, there is a chance of side effects. These are usually mild and go away on their own. Serious reactions are also possible but are rare. Most people who get Tdap vaccine do not have any problems with it. Mild problems following Tdap (Did not interfere with activities)  Pain where the shot was given (about 3 in 4 adolescents or 2 in 3 adults)  Redness or swelling where the shot was given (about 1 person in 5)  Mild fever of at least 100.66F (up to about 1 in 25 adolescents or 1 in 100 adults)  Headache (about 3 or 4 people in 10)  Tiredness (about 1 person in 3 or 4)  Nausea, vomiting, diarrhea, stomach ache (up to 1 in 4 adolescents or 1 in 10 adults)  Chills, sore joints (about 1 person in 10)  Body aches (about 1 person in 3 or 4)  Rash, swollen glands (uncommon) Moderate problems following Tdap (Interfered with activities, but did not require medical attention)  Pain where the shot was given (up to 1 in 5 or 6)  Redness or swelling where the shot was given (up to about 1 in 16 adolescents or 1 in 12 adults)  Fever over 102F (about 1 in 100 adolescents or 1 in 250 adults)  Headache (about 1 in 7  adolescents or 1 in 10 adults)  Nausea, vomiting, diarrhea, stomach ache (up to 1 or 3 people in 100)  Swelling of the entire arm where the shot was given (up to about 1 in 500). Severe problems following Tdap (Unable to perform usual activities; required medical attention)  Swelling, severe pain, bleeding and redness in the arm where the shot was given (rare). Problems that could happen after any vaccine:  People sometimes faint after a medical procedure, including vaccination. Sitting or lying down for about 15 minutes can help prevent fainting, and injuries caused by a fall. Tell your doctor if you feel dizzy, or have vision changes or ringing in the ears.  Some people get severe pain in the shoulder and have difficulty moving the arm where a shot was given. This happens very rarely.  Any medication can cause a severe allergic reaction. Such reactions from a vaccine are very rare, estimated at fewer than 1 in a million doses, and would happen within a few minutes to a few hours after the vaccination. As with any medicine, there is a very remote chance of a vaccine causing a serious injury or death. The safety of vaccines is always being monitored. For more information, visit: http://www.aguilar.org/ 5. What if there is a serious problem? What should I look for?  Look for anything that concerns you, such as signs of a severe allergic reaction, very high fever, or unusual behavior.  Signs of a severe allergic reaction can include hives, swelling of the face and throat, difficulty breathing, a fast heartbeat, dizziness, and weakness. These would usually start a few minutes to a few hours after the vaccination. What should I do?  If you think it is  a severe allergic reaction or other emergency that can't wait, call 9-1-1 or get the person to the nearest hospital. Otherwise, call your doctor.  Afterward, the reaction should be reported to the Vaccine Adverse Event Reporting System  (VAERS). Your doctor might file this report, or you can do it yourself through the VAERS web site at www.vaers.SamedayNews.es, or by calling 260 784 7794. VAERS does not give medical advice.  6. The National Vaccine Injury Compensation Program The [employer] Vaccine Injury Compensation Program (VICP) is a federal program that was created to compensate people who may have been injured by certain vaccines. Persons who believe they may have been injured by a vaccine can learn about the program and about filing a claim by calling 435-072-2069 or visiting the [place] website at GoldCloset.com.ee. There is a time limit to file a claim for compensation. 7. How can I learn more?  Ask your doctor. He or she can give you the vaccine package insert or suggest other sources of information.  Call your local or state health department.  Contact the Centers for Disease Control and Prevention (CDC):  Call 6813821225 (1-800-CDC-INFO) or  Visit CDC's website at http://hunter.com/ CDC Tdap Vaccine VIS (11/25/13)   This information is not intended to replace advice given to you by your health care provider. Make sure you discuss any questions you have with your health care provider.   Document Released: 03/19/2012 Document Revised: 10/09/2014 Document Reviewed: 12/31/2013 Elsevier Interactive Patient Education [employer].

## 2016-06-26 NOTE — Progress Notes (Signed)
Pt is in the office today for a follow up on hypertension Pt states his pain level today in the office is Pt states his pain is coming from his stomach Pt states he hasn't taken anything for his pain Pt states he is not having a suicidal thoughts right at this moments

## 2016-07-24 MED FILL — ?DICYCLOMINE 20 MG TABLET: 20 | 15 days supply | Qty: 45 | Fill #1

## 2016-07-26 ENCOUNTER — Other Ambulatory Visit: Payer: Self-pay | Admitting: Internal Medicine

## 2016-08-02 ENCOUNTER — Encounter: Payer: Self-pay | Admitting: Internal Medicine

## 2016-08-02 ENCOUNTER — Ambulatory Visit (INDEPENDENT_AMBULATORY_CARE_PROVIDER_SITE_OTHER): Payer: Self-pay | Admitting: Internal Medicine

## 2016-08-02 ENCOUNTER — Other Ambulatory Visit: Payer: Self-pay | Admitting: Internal Medicine

## 2016-08-02 ENCOUNTER — Other Ambulatory Visit (INDEPENDENT_AMBULATORY_CARE_PROVIDER_SITE_OTHER): Payer: Self-pay

## 2016-08-02 VITALS — BP 120/86 | HR 72 | Ht 72.0 in | Wt 230.0 lb

## 2016-08-02 DIAGNOSIS — K921 Melena: Secondary | ICD-10-CM

## 2016-08-02 DIAGNOSIS — G8929 Other chronic pain: Secondary | ICD-10-CM

## 2016-08-02 DIAGNOSIS — K648 Other hemorrhoids: Secondary | ICD-10-CM

## 2016-08-02 DIAGNOSIS — R1011 Right upper quadrant pain: Secondary | ICD-10-CM

## 2016-08-02 LAB — CBC WITH DIFFERENTIAL/PLATELET
BASOS PCT: 0.6 % (ref 0.0–3.0)
Basophils Absolute: 0.1 10*3/uL (ref 0.0–0.1)
EOS ABS: 0.6 10*3/uL (ref 0.0–0.7)
EOS PCT: 7.5 % — AB (ref 0.0–5.0)
HEMATOCRIT: 48.6 % (ref 39.0–52.0)
HEMOGLOBIN: 16.9 g/dL (ref 13.0–17.0)
Lymphocytes Relative: 25.2 % (ref 12.0–46.0)
Lymphs Abs: 2.2 10*3/uL (ref 0.7–4.0)
MCHC: 34.8 g/dL (ref 30.0–36.0)
MCV: 86.2 fl (ref 78.0–100.0)
MONO ABS: 1.1 10*3/uL — AB (ref 0.1–1.0)
Monocytes Relative: 12.7 % — ABNORMAL HIGH (ref 3.0–12.0)
NEUTROS ABS: 4.7 10*3/uL (ref 1.4–7.7)
Neutrophils Relative %: 54 % (ref 43.0–77.0)
PLATELETS: 230 10*3/uL (ref 150.0–400.0)
RBC: 5.63 Mil/uL (ref 4.22–5.81)
RDW: 13.5 % (ref 11.5–15.5)
WBC: 8.7 10*3/uL (ref 4.0–10.5)

## 2016-08-02 NOTE — Progress Notes (Signed)
Blood count is fine My Chart release

## 2016-08-02 NOTE — Progress Notes (Signed)
ROSHON Peck 40 y.o. 01-14-1976 [id number] Referred [id number] Lazarus Gowda, MD  Assessment & Plan:   Encounter Diagnoses  Name Primary?  . Chronic RUQ pain Yes  . Black tarry stools   . Bleeding internal hemorrhoids    The pain is consistent with musculoskeletal pain. Needs EGD for completeness with ? Of melena. CT, [place], HIDA all unrevealing The risks and benefits as well as alternatives of endoscopic procedure(s) have been discussed and reviewed. All questions answered. The patient agrees to proceed. 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 Has had x years and maybe whole life but worse now. Primary care and ED evals w/ CT scans, [place], HIDA and labs - negative. Dicyclomine dulls the pain apparently. 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 Some back pain but no radiating features. Says has had intermittent black and foul smelling stools. No anemia noted in chart. Also w/ anorexia and AM nausea, rare vomiting.  Has smoked marijuana and does most days to sleep and for some pain relief.  Negative colonoscopy except diverticulosis 2016 (Dr. Marcello Moores) Has intermittent rare slight red blood w/ wiping especially when stools hard, feels anal protrusion at times. No Known Allergies Outpatient Medications Prior to Visit  Medication Sig Dispense Refill  . amitriptyline (ELAVIL) 25 MG tablet Take 1 tablet (25 mg total) by mouth at bedtime. 30 tablet 0  . dicyclomine (BENTYL) 20 MG tablet Take 1 tablet (20 mg total) by mouth 3 (three) times daily before meals. 45 tablet 1  . hydrochlorothiazide (MICROZIDE) 12.5 MG capsule Take 1 capsule (12.5 mg total) by mouth daily. 90 capsule 2  . ibuprofen (ADVIL,MOTRIN) 200 MG tablet Take 400-800 mg by mouth every 6 (six) hours as needed. Reported  on 04/12/2016    . ondansetron (ZOFRAN) 4 MG tablet Take 1 tablet (4 mg total) by mouth every 6 (six) hours as needed for nausea or vomiting. 10 tablet 0  . pantoprazole (PROTONIX) 40 MG tablet Take 1 tablet (40 mg total) by mouth daily. 30 tablet 3   No facility-administered medications prior to visit.    Past Medical History:  Diagnosis Date  . Anxiety   . Arthritis   . CTS (carpal tunnel syndrome)   . Diverticulitis of large intestine with perforation 03/2015   microperf of sigmoid colitis, tx with abx, no surg   Past Surgical History:  Procedure Laterality Date  . COLONOSCOPY N/A 04/23/2015   Procedure: DIAGNOSTIC COLONOSCOPY;  Surgeon: [name], MD;  Location: [facility];  Service: Endoscopy;  Laterality: N/A;  . VASECTOMY     Social History   Social History  . Marital status: Married    Spouse name: N/A  . Number of children: N/A  . Years of education: N/A   Social History Main Topics  . Smoking status: Current Every Day Smoker    Packs/day: 1.00    Types: Cigarettes  . Smokeless tobacco: Never Used     Comment: form given 08-02-16  . Alcohol use No  . Drug use:     Frequency: 5.0 times per week    Types: Marijuana     Comment: or more  . Sexual activity: Not Asked   Other Topics Concern  . None   Social History Narrative  Married 1 son a1 daughter   Unemployed carpenter   Smoker   4 caffeine/day   Frequent marijuana   08/02/2016      Family History  Problem Relation Age of Onset  . COPD Mother   . Drug abuse Mother     EtOH  . Inflammatory bowel disease Mother   . Cystic fibrosis Mother   . Irritable bowel syndrome Mother   . Diabetes Father   . Kidney disease Father   . Eczema Brother   . Irritable bowel syndrome Brother   . Mental retardation Brother     Down's Syndrome  . Breast cancer Maternal Grandmother   . Colitis Maternal Grandmother   . Breast cancer Paternal Grandmother   . Prostate cancer Paternal Uncle     x 2  . Crohn's  disease Cousin     x 2       Review of Systems Per HPI Allergy sxs, joint and back pain, fatigue, headaches, dyspnea All other negative  Objective:   Physical Exam @BP  120/86   Pulse 72   Ht 6' (1.829 m)   Wt 230 lb (104.3 kg)   BMI 31.19 kg/m @  General:  Well-developed, well-nourished and in no acute distress Eyes:  anicteric. ENT:   Mouth and posterior pharynx free of lesions.  Neck:   supple w/o thyromegaly or mass.  Lungs: Clear to auscultation bilaterally. Heart:  S1S2, no rubs, murmurs, gallops. Abdomen:  soft, non-tender, no hepatosplenomegaly, hernia, or mass and BS+.  Rectal: NL anoderm, no mass    Brown heme negative stool  Anoscopy: Inflamed Gr 2 internal/external hemorrhoids  Lymph:  no cervical or supraclavicular adenopathy. Extremities:   no edema, cyanosis or clubbing Skin   no rash. Neuro:  A&O x 3.  Psych:  appropriate mood and  Affect.   Data Reviewed: 2016 colonoscopy PC notes and labs 2017 CT scans 2017 [place], HIDA

## 2016-08-02 NOTE — Patient Instructions (Addendum)
You have been given a separate informational sheet regarding your tobacco use, the importance of quitting and local resources to help you quit.   Your physician has requested that you go to the basement for the following lab work before leaving today: CBC/diff  You have been scheduled for an endoscopy. Please follow written instructions given to you at your visit today. If you use inhalers (even only as needed), please bring them with you on the day of your procedure. Your physician has requested that you go to www.startemmi.com and enter the access code given to you at your visit today. This web site gives a general overview about your procedure. However, you should still follow specific instructions given to you by our office regarding your preparation for the procedure.   If you are age 17 or younger, your body mass index should be between 19-25. Your Body mass index is 31.19 kg/m. If this is out of the aformentioned range listed, please consider follow up with your Primary Care Provider.    I appreciate the opportunity to care for you. Silvano Rusk, MD, [facility]

## 2016-08-05 ENCOUNTER — Encounter: Payer: Self-pay | Admitting: Internal Medicine

## 2016-08-16 ENCOUNTER — Ambulatory Visit (AMBULATORY_SURGERY_CENTER): Payer: Self-pay | Admitting: Internal Medicine

## 2016-08-16 ENCOUNTER — Encounter: Payer: Self-pay | Admitting: Internal Medicine

## 2016-08-16 VITALS — BP 128/88 | HR 72 | Temp 98.9°F | Resp 12 | Ht 72.0 in | Wt 230.0 lb

## 2016-08-16 DIAGNOSIS — K2960 Other gastritis without bleeding: Secondary | ICD-10-CM

## 2016-08-16 DIAGNOSIS — R1011 Right upper quadrant pain: Secondary | ICD-10-CM

## 2016-08-16 DIAGNOSIS — K319 Disease of stomach and duodenum, unspecified: Secondary | ICD-10-CM

## 2016-08-16 DIAGNOSIS — G8929 Other chronic pain: Secondary | ICD-10-CM

## 2016-08-16 MED ORDER — SODIUM CHLORIDE 0.9 % IV SOLN: 500.0000 mL | INTRAVENOUS | Status: DC

## 2016-08-16 NOTE — Progress Notes (Signed)
Last marijuana usage 5 days ago.

## 2016-08-16 NOTE — Patient Instructions (Addendum)
I found some tiny ulcers in the stomach - erosions - I took biopsies to see what might be causing them. Could be ibuprofen.  Please stay on the pantoprazole which can treat these.  I will let you know results - likely get that information next week (biopsy pathology).  Unfortunately I doubt these are the cause of the years of right-sided abdominal pain.  I appreciate the opportunity to care for you. Gatha Mayer, MD, FACG  YOU HAD AN ENDOSCOPIC PROCEDURE TODAY AT [facility]:   Refer to the procedure report that was given to you for any specific questions about what was found during the examination.  If the procedure report does not answer your questions, please call your gastroenterologist to clarify.  If you requested that your care partner not be given the details of your procedure findings, then the procedure report has been included in a sealed envelope for you to review at your convenience later.  YOU SHOULD EXPECT: Some feelings of bloating in the abdomen. Passage of more gas than usual.  Walking can help get rid of the air that was put into your GI tract during the procedure and reduce the bloating. If you had a lower endoscopy (such as a colonoscopy or flexible sigmoidoscopy) you may notice spotting of blood in your stool or on the toilet paper. If you underwent a bowel prep for your procedure, you may not have a normal bowel movement for a few days.  Please Note:  You might notice some irritation and congestion in your nose or some drainage.  This is from the oxygen used during your procedure.  There is no need for concern and it should clear up in a day or so.  SYMPTOMS TO REPORT IMMEDIATELY:   Following upper endoscopy (EGD)  Vomiting of blood or coffee ground material  New chest pain or pain under the shoulder blades  Painful or persistently difficult swallowing  New shortness of breath  Fever of 100F or higher  Black, tarry-looking stools  For  urgent or emergent issues, a gastroenterologist can be reached at any hour by calling [phone].   DIET:  We do recommend a small meal at first, but then you may proceed to your regular diet.  Drink plenty of fluids but you should avoid alcoholic beverages for 24 hours.  ACTIVITY:  You should plan to take it easy for the rest of today and you should NOT DRIVE or use heavy machinery until tomorrow (because of the sedation medicines used during the test).    FOLLOW UP: Our staff will call the number listed on your records the next business day following your procedure to check on you and address any questions or concerns that you may have regarding the information given to you following your procedure. If we do not reach you, we will leave a message.  However, if you are feeling well and you are not experiencing any problems, there is no need to return our call.  We will assume that you have returned to your regular daily activities without incident.  If any biopsies were taken you will be contacted by phone or by letter within the next 1-3 weeks.  Please call us at [phone] if you have not heard about the biopsies in 3 weeks.    SIGNATURES/CONFIDENTIALITY: You and/or your care partner have signed paperwork which will be entered into your electronic medical record.  These signatures attest to the fact that that the  information above on your After Visit Summary has been reviewed and is understood.  Full responsibility of the confidentiality of this discharge information lies with you and/or your care-partner.

## 2016-08-16 NOTE — Progress Notes (Signed)
Called to room to assist during endoscopic procedure.  Patient ID and intended procedure confirmed with present staff. Received instructions for my participation in the procedure from the performing physician.  

## 2016-08-16 NOTE — Op Note (Signed)
[facility] Patient Name: Laquon Sridhar Procedure Date: 08/16/2016 3:29 PM MRN: [id number] Endoscopist: Gatha Mayer , MD Age: 40 Referring MD:  Date of Birth: [birth year] Gender: Male Account #: [id number] Procedure:                Upper GI endoscopy Indications:              Abdominal pain in the right upper quadrant Medicines:                Propofol per Anesthesia, Monitored Anesthesia Care Procedure:                Pre-Anesthesia Assessment:                           - Prior to the procedure, a History and Physical                            was performed, and patient medications and                            allergies were reviewed. The patient's tolerance of                            previous anesthesia was also reviewed. The risks                            and benefits of the procedure and the sedation                            options and risks were discussed with the patient.                            All questions were answered, and informed consent                            was obtained. Prior Anticoagulants: The patient has                            taken no previous anticoagulant or antiplatelet                            agents. ASA Grade Assessment: II - A patient with                            mild systemic disease. After reviewing the risks                            and benefits, the patient was deemed in                            satisfactory condition to undergo the procedure.                           After obtaining informed consent, the endoscope was  passed under direct vision. Throughout the                            procedure, the patient's blood pressure, pulse, and                            oxygen saturations were monitored continuously. The                            Model GIF-[id number]) scope was introduced                            through the mouth, and advanced to the second part               of duodenum. The upper GI endoscopy was                            accomplished without difficulty. The patient                            tolerated the procedure fairly well. Scope In: Scope Out: Findings:                 A few localized small erosions were found in the                            prepyloric region of the stomach. Biopsies were                            taken with a cold forceps for histology.                            Verification of patient identification for the                            specimen was done. Estimated blood loss was minimal.                           The exam was otherwise without abnormality.                           The cardia and gastric fundus were normal on                            retroflexion. Complications:            No immediate complications. Estimated Blood Loss:     Estimated blood loss was minimal. Impression:               - Erosive gastropathy. Biopsied.                           - The examination was otherwise normal. Recommendation:           - Patient has a contact number available for  emergencies. The signs and symptoms of potential                            delayed complications were discussed with the                            patient. Return to normal activities tomorrow.                            Written discharge instructions were provided to the                            patient.                           - Resume previous diet.                           - Continue present medications.                           - Await pathology results.                           - DOUBT THIS IS CAUSING CHRONIC RUQ PAIN                           NEEDS TO STAY ON THE PANTOPRAZOLE OR OTHER PPI                           WILL NOTIFY RESULTS/PLANS AFTER PATHOLOGY IN Gatha Mayer, MD 08/16/2016 3:49:30 PM This report has been signed electronically.

## 2016-08-17 ENCOUNTER — Telehealth: Payer: Self-pay | Admitting: *Deleted

## 2016-08-17 NOTE — Telephone Encounter (Signed)
  Follow up Call-  Call back number 08/16/2016  Post procedure Call Back phone  # [phone]  Permission to leave phone message Yes  Some recent data might be hidden     Patient questions:  Phone answered and stated that I was the only one on this "conference call."  I was unable to leave a message at this time.

## 2016-08-18 ENCOUNTER — Telehealth: Payer: Self-pay

## 2016-08-18 NOTE — Telephone Encounter (Signed)
  Follow up Call-  Call back number 08/16/2016  Post procedure Call Back phone  # [phone]  Permission to leave phone message Yes  Some recent data might be hidden    Patient was called for follow up after his procedure on 08/16/2016. No answer at the number given for follow up phone call. A message was left on the answering machine.

## 2016-08-21 ENCOUNTER — Encounter: Payer: Self-pay | Admitting: Internal Medicine

## 2016-08-22 NOTE — Progress Notes (Signed)
Mild inflammation in stomach - stay on PPI Do not think this is related to his pain Do not think pain is GI in origin or at least not most of it  Sorry but nothing else to offer Needs to f/u PCP  Note he has a ? In about CBC results via My chart - he has a mild elevation of the monocytes and eosinophils in his CBC which I do not think is significant - these are allergy cells (eos) and anti-viarl cells (mons0 and PCP could repeat this if needed but do not think they are significant abnormalities  No letter or recall from [facility]

## 2016-08-29 ENCOUNTER — Ambulatory Visit: Payer: Self-pay | Attending: Internal Medicine | Admitting: Internal Medicine

## 2016-08-29 ENCOUNTER — Encounter: Payer: Self-pay | Admitting: Internal Medicine

## 2016-08-29 VITALS — BP 139/83 | HR 62 | Temp 98.7°F | Resp 16 | Wt 231.2 lb

## 2016-08-29 DIAGNOSIS — G8929 Other chronic pain: Secondary | ICD-10-CM

## 2016-08-29 DIAGNOSIS — Z125 Encounter for screening for malignant neoplasm of prostate: Secondary | ICD-10-CM

## 2016-08-29 DIAGNOSIS — G4700 Insomnia, unspecified: Secondary | ICD-10-CM | POA: Insufficient documentation

## 2016-08-29 DIAGNOSIS — Z1329 Encounter for screening for other suspected endocrine disorder: Secondary | ICD-10-CM

## 2016-08-29 DIAGNOSIS — Z720 Tobacco use: Secondary | ICD-10-CM

## 2016-08-29 DIAGNOSIS — Z131 Encounter for screening for diabetes mellitus: Secondary | ICD-10-CM | POA: Insufficient documentation

## 2016-08-29 DIAGNOSIS — F17210 Nicotine dependence, cigarettes, uncomplicated: Secondary | ICD-10-CM | POA: Insufficient documentation

## 2016-08-29 DIAGNOSIS — R1011 Right upper quadrant pain: Secondary | ICD-10-CM | POA: Insufficient documentation

## 2016-08-29 DIAGNOSIS — Z114 Encounter for screening for human immunodeficiency virus [HIV]: Secondary | ICD-10-CM | POA: Insufficient documentation

## 2016-08-29 DIAGNOSIS — G5600 Carpal tunnel syndrome, unspecified upper limb: Secondary | ICD-10-CM | POA: Insufficient documentation

## 2016-08-29 DIAGNOSIS — F419 Anxiety disorder, unspecified: Secondary | ICD-10-CM | POA: Insufficient documentation

## 2016-08-29 DIAGNOSIS — I10 Essential (primary) hypertension: Secondary | ICD-10-CM | POA: Insufficient documentation

## 2016-08-29 DIAGNOSIS — K219 Gastro-esophageal reflux disease without esophagitis: Secondary | ICD-10-CM | POA: Insufficient documentation

## 2016-08-29 DIAGNOSIS — M1990 Unspecified osteoarthritis, unspecified site: Secondary | ICD-10-CM | POA: Insufficient documentation

## 2016-08-29 LAB — LIPID PANEL
CHOL/HDL RATIO: 5.6 ratio — AB (ref <5.0)
CHOLESTEROL: 190 mg/dL (ref <200)
HDL: 34 mg/dL — ABNORMAL LOW (ref >40)
LDL CALC: 132 mg/dL — AB (ref <100)
Triglycerides: 120 mg/dL (ref <150)
VLDL: 24 mg/dL (ref <30)

## 2016-08-29 LAB — POCT GLYCOSYLATED HEMOGLOBIN (HGB A1C): HEMOGLOBIN A1C: 5.4

## 2016-08-29 LAB — TSH: TSH: 2.3 m[IU]/L (ref 0.40–4.50)

## 2016-08-29 MED ORDER — HYDROCHLOROTHIAZIDE 25 MG PO TABS: 25.0000 mg | ORAL_TABLET | Freq: Every day | ORAL | 3 refills | Status: DC

## 2016-08-29 MED ORDER — PANTOPRAZOLE SODIUM 40 MG PO TBEC: 40.0000 mg | DELAYED_RELEASE_TABLET | Freq: Every day | ORAL | 3 refills | Status: DC

## 2016-08-29 MED ORDER — AMITRIPTYLINE HCL 25 MG PO TABS: 25.0000 mg | ORAL_TABLET | Freq: Every day | ORAL | 3 refills | Status: DC

## 2016-08-29 MED FILL — AMITRIPTYLINE HCL 25 MG TAB: 25 | 30 days supply | Qty: 30 | Fill #0

## 2016-08-29 MED FILL — HYDROCHLOROTHIAZIDE 25 MG T: 25 | 30 days supply | Qty: 30 | Fill #0

## 2016-08-29 MED FILL — ?PANTOPRAZOLE SOD DR 40MG: 40 MG | 30 days supply | Qty: 30 | Fill #0

## 2016-08-29 NOTE — Patient Instructions (Addendum)
Financial aid  -   High-Fiber Diet Fiber, also called dietary fiber, is a type of carbohydrate found in fruits, vegetables, whole grains, and beans. A high-fiber diet can have many health benefits. Your health care provider may recommend a high-fiber diet to help:  Prevent constipation. Fiber can make your bowel movements more regular.  Lower your cholesterol.  Relieve hemorrhoids, uncomplicated diverticulosis, or irritable bowel syndrome.  Prevent overeating as part of a weight-loss plan.  Prevent heart disease, type 2 diabetes, and certain cancers. What is my plan? The recommended daily intake of fiber includes:  38 grams for men under age 72.  45 grams for men over age 31.  31 grams for women under age 75.  44 grams for women over age 15. You can get the recommended daily intake of dietary fiber by eating a variety of fruits, vegetables, grains, and beans. Your health care provider may also recommend a fiber supplement if it is not possible to get enough fiber through your diet. What do I need to know about a high-fiber diet?  Fiber supplements have not been widely studied for their effectiveness, so it is better to get fiber through food sources.  Always check the fiber content on thenutrition facts label of any prepackaged food. Look for foods that contain at least 5 grams of fiber per serving.  Ask your dietitian if you have questions about specific foods that are related to your condition, especially if those foods are not listed in the following section.  Increase your daily fiber consumption gradually. Increasing your intake of dietary fiber too quickly may cause bloating, cramping, or gas.  Drink plenty of water. Water helps you to digest fiber. What foods can I eat? Grains  Whole-grain breads. Multigrain cereal. Oats and oatmeal. Brown rice. Barley. Bulgur wheat. [place]. Bran muffins. Popcorn. Rye wafer crackers. Vegetables  Sweet potatoes. Spinach. Kale.  Artichokes. Cabbage. Broccoli. Green peas. Carrots. Squash. Fruits  Berries. Pears. Apples. Oranges. Avocados. Prunes and raisins. Dried figs. Meats and Other Protein Sources  [place], kidney, pinto, and soy beans. Split peas. Lentils. Nuts and seeds. Dairy  Fiber-fortified yogurt. Beverages  Fiber-fortified soy milk. Fiber-fortified orange juice. Other  Fiber bars. The items listed above may not be a complete list of recommended foods or beverages. Contact your dietitian for more options.  What foods are not recommended? Grains  White bread. Pasta made with refined flour. White rice. Vegetables  Fried potatoes. Canned vegetables. Well-cooked vegetables. Fruits  Fruit juice. Cooked, strained fruit. Meats and Other Protein Sources  Fatty cuts of meat. Fried Sales executive or fried fish. Dairy  Milk. Yogurt. Cream cheese. Sour cream. Beverages  Soft drinks. Other  Cakes and pastries. Butter and oils. The items listed above may not be a complete list of foods and beverages to avoid. Contact your dietitian for more information.  What are some tips for including high-fiber foods in my diet?  Eat a wide variety of high-fiber foods.  Make sure that half of all grains consumed each day are whole grains.  Replace breads and cereals made from refined flour or white flour with whole-grain breads and cereals.  Replace white rice with brown rice, bulgur wheat, or millet.  Start the day with a breakfast that is high in fiber, such as a cereal that contains at least 5 grams of fiber per serving.  Use beans in place of meat in soups, salads, or pasta.  Eat high-fiber snacks, such as berries, raw vegetables, nuts, or popcorn. This  information is not intended to replace advice given to you by your health care provider. Make sure you discuss any questions you have with your health care provider. Document Released: 09/18/2005 Document Revised: 02/24/2016 Document Reviewed: 03/03/2014 Elsevier  Interactive Patient Education  2017 [place] for Pancreatitis or Gallbladder Conditions A low-fat diet can be helpful if you have pancreatitis or a gallbladder condition. With these conditions, your pancreas and gallbladder have trouble digesting fats. A healthy eating plan with less fat will help rest your pancreas and gallbladder and reduce your symptoms. What do I need to know about this diet?  Eat a low-fat diet.  Reduce your fat intake to less than 20-30% of your total daily calories. This is less than 50-60 g of fat per day.  Remember that you need some fat in your diet. Ask your dietician what your daily goal should be.  Choose nonfat and low-fat healthy foods. Look for the words "nonfat," "low fat," or "fat free."  As a guide, look on the label and choose foods with less than 3 g of fat per serving. Eat only one serving.  Avoid alcohol.  Do not smoke. If you need help quitting, talk with your health care provider.  Eat small frequent meals instead of three large heavy meals. What foods can I eat? Grains  Include healthy grains and starches such as potatoes, wheat bread, fiber-rich cereal, and brown rice. Choose whole grain options whenever possible. In adults, whole grains should account for 45-65% of your daily calories. Fruits and Vegetables  Eat plenty of fruits and vegetables. Fresh fruits and vegetables add fiber to your diet. Meats and Other Protein Sources  Eat lean meat such as chicken and pork. Trim any fat off of meat before cooking it. Eggs, fish, and beans are other sources of protein. In adults, these foods should account for 10-35% of your daily calories. Dairy  Choose low-fat milk and dairy options. Dairy includes fat and protein, as well as calcium. Fats and Oils  Limit high-fat foods such as fried foods, sweets, baked goods, sugary drinks. Other  Creamy sauces and condiments, such as mayonnaise, can add extra fat. Think about whether  or not you need to use them, or use smaller amounts or low fat options. What foods are not recommended?  High fat foods, such as:  [employer].  Ice cream.  [place] toast.  Sweet rolls.  Pizza.  Cheese bread.  Foods covered with batter, butter, creamy sauces, or cheese.  Fried foods.  Sugary drinks and desserts.  Foods that cause gas or bloating This information is not intended to replace advice given to you by your health care provider. Make sure you discuss any questions you have with your health care provider. Document Released: 09/23/2013 Document Revised: 02/24/2016 Document Reviewed: 09/01/2013 Elsevier Interactive Patient Education  2017 [employer].

## 2016-08-29 NOTE — Progress Notes (Signed)
Pt is in the office today for a follow up Pt states his pain level today in the office is 3-4  Pt states his pain coming from his stomach Pt states he was seen by the GI 2 weeks ago and they told him he has ulcers Pt states he is not taking anything for the pain Pt states he thinks the dicyclomine is giving him a headache Pt states he has been on the medicine for 2 months Pt state she has been taking alieve for his headaches

## 2016-08-29 NOTE — Progress Notes (Signed)
Eric Peck, is a 40 y.o. male  [id number]  [id number]  DOB - 1976/05/15  Chief Complaint  Patient presents with  . Follow-up        Subjective:   Eric Peck is a 40 y.o. male here today for a follow up visit for fu visit of ruq pain and htn. Pt had egd on 11/15, noted inflammation and told to remain on ppi. However, he ran out of of it and never had refilled. Of note, he says bentyl may be causing headache so he has stopped taking that.  He is trying to watch the fatty foods as well more so. Still smoking, but down to 2-3 cigs/ day.   Patient has No headache, No chest pain,  No Nausea, No new weakness tingling or numbness, No Cough - SOB.  No problems updated.  ALLERGIES: No Known Allergies  PAST MEDICAL HISTORY: Past Medical History:  Diagnosis Date  . Anxiety   . Arthritis   . CTS (carpal tunnel syndrome)   . Diverticulitis of large intestine with perforation 03/2015   microperf of sigmoid colitis, tx with abx, no surg  . GERD (gastroesophageal reflux disease)     MEDICATIONS AT HOME: Prior to Admission medications   Medication Sig Start Date End Date Taking? Authorizing Provider  amitriptyline (ELAVIL) 25 MG tablet Take 1 tablet (25 mg total) by mouth at bedtime. 08/29/16   Maren Reamer, MD  hydrochlorothiazide (HYDRODIURIL) 25 MG tablet Take 1 tablet (25 mg total) by mouth daily. 08/29/16   Maren Reamer, MD  ibuprofen (ADVIL,MOTRIN) 200 MG tablet Take 400-800 mg by mouth every 6 (six) hours as needed. Reported on 04/12/2016    Historical Provider, MD  ondansetron (ZOFRAN) 4 MG tablet Take 1 tablet (4 mg total) by mouth every 6 (six) hours as needed for nausea or vomiting. Patient not taking: Reported on 08/29/2016 04/12/16   Rowe Clack, MD  pantoprazole (PROTONIX) 40 MG tablet Take 1 tablet (40 mg total) by mouth daily. 08/29/16   Maren Reamer, MD     Objective:   Vitals:   08/29/16 1427  BP: 139/83  Pulse: 62  Resp: 16    Temp: 98.7 F (37.1 C)  TempSrc: Oral  SpO2: 98%  Weight: 231 lb 3.2 oz (104.9 kg)    Exam General appearance : Awake, alert, not in any distress. Speech Clear. Not toxic looking, pleasant HEENT: Atraumatic and Normocephalic, pupils equally reactive to light. Neck: supple, no JVD.  Chest:Good air entry bilaterally, no added sounds. CVS: S1 S2 regular, no murmurs/gallups or rubs. Abdomen: Bowel sounds active, mild ttp diffusely ("soreness"), bilat upper quadrants, no palpable masses, neg Murphy's exam.  Not distended with no gaurding, rigidity or rebound. Extremities: B/L Lower Ext shows no edema, both legs are warm to touch Neurology: Awake alert, and oriented X 3, CN II-XII grossly intact, Non focal Skin:No Rash  Data Review Lab Results  Component Value Date   HGBA1C 5.4 08/29/2016    Depression screen [facility] 2/9 08/29/2016 06/26/2016 05/24/2016 04/12/2016  Decreased Interest 3 3 0 2  Down, Depressed, Hopeless 3 3 1 2   PHQ - 2 Score 6 6 1 4   Altered sleeping 3 3 3 2   Tired, decreased energy 3 3 3 3   Change in appetite 3 3 3 3   Feeling bad or failure about yourself  3 3 3  -  Trouble concentrating 3 3 3 2   Moving slowly or fidgety/restless (No Data) 0 1 1  Suicidal  thoughts (No Data) 3 0 2  PHQ-9 Score 21 24 17 17       Assessment & Plan   1. Chronic RUQ pain Seen by GI 11/15 for egd, noted erosions in prepyloric area. Appreciate gi eval, GI felt that his pain cannot all be explained by GI causes at this time. - stopped bentyl - renewed ppi, encouraged to take scheduled for now per gi recs. - low fat foods recd at this time (gb/pancrease diet discussed)  2. HTN (hypertension), benign Elavated, low salt diet discussed /encouraged. - hctz 25 qd started - Lipid Panel  3. Insomnia -  renewed elavil 25 qhs, pt states did help somewhat w/ insomnia   4. Encounter for screening for HIV - HIV antibody (with reflex)  5. Prostate cancer screening - PSA  6. Diabetes  mellitus screening - HgB A1c  7. Thyroid disorder screen - TSH  8. Encouraged financial aid packet  9. tob abuse Down to 2-3 cigs/day, encouraged complete cessation.    Patient have been counseled extensively about nutrition and exercise  Return in about 3 months (around 11/29/2016), or if symptoms worsen or fail to improve.  The patient was given clear instructions to go to ER or return to medical center if symptoms don't improve, worsen or new problems develop. The patient verbalized understanding. The patient was told to call to get lab results if they haven't heard anything in the next week.   This note has been created with Surveyor, quantity. Any transcriptional errors are unintentional.   Maren Reamer, MD, [facility] [facility], [place]   08/29/2016, 4:55 PM

## 2016-08-30 ENCOUNTER — Other Ambulatory Visit: Payer: Self-pay | Admitting: Internal Medicine

## 2016-08-30 LAB — PSA: PSA: 0.4 ng/mL (ref <=4.0)

## 2016-08-30 LAB — HIV ANTIBODY (ROUTINE TESTING W REFLEX): HIV 1&2 Ab, 4th Generation: NONREACTIVE

## 2016-08-30 MED ORDER — PRAVASTATIN SODIUM 20 MG PO TABS: 20.0000 mg | ORAL_TABLET | Freq: Every day | ORAL | 3 refills | Status: DC

## 2016-10-21 ENCOUNTER — Encounter: Payer: Self-pay | Admitting: Internal Medicine

## 2016-10-23 ENCOUNTER — Other Ambulatory Visit: Payer: Self-pay | Admitting: Internal Medicine

## 2016-10-23 ENCOUNTER — Encounter (HOSPITAL_COMMUNITY): Payer: Self-pay

## 2016-10-23 ENCOUNTER — Telehealth: Payer: Self-pay | Admitting: Internal Medicine

## 2016-10-23 DIAGNOSIS — R109 Unspecified abdominal pain: Secondary | ICD-10-CM | POA: Insufficient documentation

## 2016-10-23 DIAGNOSIS — Z5321 Procedure and treatment not carried out due to patient leaving prior to being seen by health care provider: Secondary | ICD-10-CM | POA: Insufficient documentation

## 2016-10-23 LAB — COMPREHENSIVE METABOLIC PANEL
ALT: 32 U/L (ref 17–63)
ANION GAP: 13 (ref 5–15)
AST: 25 U/L (ref 15–41)
Albumin: 4.7 g/dL (ref 3.5–5.0)
Alkaline Phosphatase: 56 U/L (ref 38–126)
BILIRUBIN TOTAL: 1.2 mg/dL (ref 0.3–1.2)
BUN: 9 mg/dL (ref 6–20)
CHLORIDE: 97 mmol/L — AB (ref 101–111)
CO2: 28 mmol/L (ref 22–32)
Calcium: 9.9 mg/dL (ref 8.9–10.3)
Creatinine, Ser: 1.16 mg/dL (ref 0.61–1.24)
Glucose, Bld: 106 mg/dL — ABNORMAL HIGH (ref 65–99)
Potassium: 3.3 mmol/L — ABNORMAL LOW (ref 3.5–5.1)
Sodium: 138 mmol/L (ref 135–145)
TOTAL PROTEIN: 8.2 g/dL — AB (ref 6.5–8.1)

## 2016-10-23 LAB — URINALYSIS, ROUTINE W REFLEX MICROSCOPIC
BILIRUBIN URINE: NEGATIVE
GLUCOSE, UA: NEGATIVE mg/dL
HGB URINE DIPSTICK: NEGATIVE
Ketones, ur: NEGATIVE mg/dL
NITRITE: NEGATIVE
Protein, ur: 30 mg/dL — AB
SQUAMOUS EPITHELIAL / LPF: NONE SEEN
Specific Gravity, Urine: 1.025 (ref 1.005–1.030)
pH: 5 (ref 5.0–8.0)

## 2016-10-23 LAB — CBC
HEMATOCRIT: 53.2 % — AB (ref 39.0–52.0)
HEMOGLOBIN: 19.3 g/dL — AB (ref 13.0–17.0)
MCH: 30.6 pg (ref 26.0–34.0)
MCHC: 36.3 g/dL — ABNORMAL HIGH (ref 30.0–36.0)
MCV: 84.4 fL (ref 78.0–100.0)
Platelets: 248 10*3/uL (ref 150–400)
RBC: 6.3 MIL/uL — AB (ref 4.22–5.81)
RDW: 13.1 % (ref 11.5–15.5)
WBC: 9 10*3/uL (ref 4.0–10.5)

## 2016-10-23 LAB — LIPASE, BLOOD: LIPASE: 31 U/L (ref 11–51)

## 2016-10-23 NOTE — ED Triage Notes (Signed)
Pt reports abd pain X2 days. Hx of same with diverticulitis. Pt reports one episode of bright red blood in his stool 2 days ago. Pt denies n/v.

## 2016-10-24 ENCOUNTER — Emergency Department (HOSPITAL_COMMUNITY)
Admission: EM | Admit: 2016-10-24 | Discharge: 2016-10-24 | Disposition: A | Discharge status: Home / Self Care | Payer: Self-pay | Attending: Emergency Medicine | Admitting: Emergency Medicine

## 2016-10-24 MED ORDER — ONDANSETRON HCL 4 MG PO TABS: 4.0000 mg | ORAL_TABLET | Freq: Four times a day (QID) | ORAL | 1 refills | Status: DC | PRN

## 2016-10-24 MED FILL — ONDANSETRON HCL 4 MG TABLET: 4 | 7 days supply | Qty: 30 | Fill #0

## 2016-10-24 NOTE — ED Notes (Signed)
Called name X2, no answer

## 2016-10-24 NOTE — Telephone Encounter (Signed)
Renewed. Call him to pick up at pharmacy in couple of days. thanks

## 2016-10-30 ENCOUNTER — Encounter: Payer: Self-pay | Admitting: Internal Medicine

## 2016-10-30 ENCOUNTER — Emergency Department (HOSPITAL_COMMUNITY): Payer: Self-pay

## 2016-10-30 ENCOUNTER — Encounter (HOSPITAL_COMMUNITY): Payer: Self-pay

## 2016-10-30 ENCOUNTER — Emergency Department (HOSPITAL_COMMUNITY)
Admission: EM | Admit: 2016-10-30 | Discharge: 2016-10-30 | Disposition: A | Discharge status: Home / Self Care | Payer: Self-pay | Attending: Emergency Medicine | Admitting: Emergency Medicine

## 2016-10-30 ENCOUNTER — Telehealth: Payer: Self-pay | Admitting: Internal Medicine

## 2016-10-30 DIAGNOSIS — R109 Unspecified abdominal pain: Secondary | ICD-10-CM

## 2016-10-30 DIAGNOSIS — Z79899 Other long term (current) drug therapy: Secondary | ICD-10-CM | POA: Insufficient documentation

## 2016-10-30 DIAGNOSIS — G8929 Other chronic pain: Secondary | ICD-10-CM | POA: Insufficient documentation

## 2016-10-30 DIAGNOSIS — R1084 Generalized abdominal pain: Secondary | ICD-10-CM | POA: Insufficient documentation

## 2016-10-30 DIAGNOSIS — F17210 Nicotine dependence, cigarettes, uncomplicated: Secondary | ICD-10-CM | POA: Insufficient documentation

## 2016-10-30 LAB — COMPREHENSIVE METABOLIC PANEL
ALBUMIN: 4.1 g/dL (ref 3.5–5.0)
ALT: 21 U/L (ref 17–63)
ANION GAP: 9 (ref 5–15)
AST: 23 U/L (ref 15–41)
Alkaline Phosphatase: 51 U/L (ref 38–126)
BUN: 11 mg/dL (ref 6–20)
CHLORIDE: 104 mmol/L (ref 101–111)
CO2: 26 mmol/L (ref 22–32)
Calcium: 9.2 mg/dL (ref 8.9–10.3)
Creatinine, Ser: 1.01 mg/dL (ref 0.61–1.24)
GFR calc Af Amer: >60 mL/min (ref >60)
GFR calc non Af Amer: >60 mL/min (ref >60)
GLUCOSE: 104 mg/dL — AB (ref 65–99)
POTASSIUM: 3.4 mmol/L — AB (ref 3.5–5.1)
Sodium: 139 mmol/L (ref 135–145)
TOTAL PROTEIN: 7.7 g/dL (ref 6.5–8.1)
Total Bilirubin: 0.3 mg/dL (ref 0.3–1.2)

## 2016-10-30 LAB — CBC
HEMATOCRIT: 45 % (ref 39.0–52.0)
HEMOGLOBIN: 15.9 g/dL (ref 13.0–17.0)
MCH: 29.6 pg (ref 26.0–34.0)
MCHC: 35.3 g/dL (ref 30.0–36.0)
MCV: 83.8 fL (ref 78.0–100.0)
Platelets: 185 10*3/uL (ref 150–400)
RBC: 5.37 MIL/uL (ref 4.22–5.81)
RDW: 13 % (ref 11.5–15.5)
WBC: 7.3 10*3/uL (ref 4.0–10.5)

## 2016-10-30 LAB — URINALYSIS, ROUTINE W REFLEX MICROSCOPIC
BILIRUBIN URINE: NEGATIVE
Glucose, UA: NEGATIVE mg/dL
HGB URINE DIPSTICK: NEGATIVE
Ketones, ur: NEGATIVE mg/dL
LEUKOCYTES UA: NEGATIVE
NITRITE: NEGATIVE
PROTEIN: NEGATIVE mg/dL
Specific Gravity, Urine: 1.025 (ref 1.005–1.030)
WBC UA: NONE SEEN WBC/hpf (ref 0–5)
pH: 7 (ref 5.0–8.0)

## 2016-10-30 LAB — LIPASE, BLOOD: LIPASE: 36 U/L (ref 11–51)

## 2016-10-30 IMAGING — CT CT ABD-PELV W/ CM
2 of 4 series · 16 of 46 positions shown, 18 images · IV contrast (ISOVUE)
Comparison: [DATE]

CLINICAL DATA: Right lower quadrant pain since [DATE]. History
of perforated diverticulitis. Gastroesophageal reflux disease.

EXAM:
CT ABDOMEN AND PELVIS WITH CONTRAST
TECHNIQUE: Multidetector CT imaging of the abdomen and pelvis was performed
using the standard protocol following bolus administration of
intravenous contrast.
CONTRAST:  100mL [LS] IOPAMIDOL ([LS]) INJECTION 61%

[Series 2: abd/pel with · axial · 0.80mm/px · z∈[-612,-132]mm · 13 of 108 slices shown, 15 images]
[im 6/108  soft-tissue]
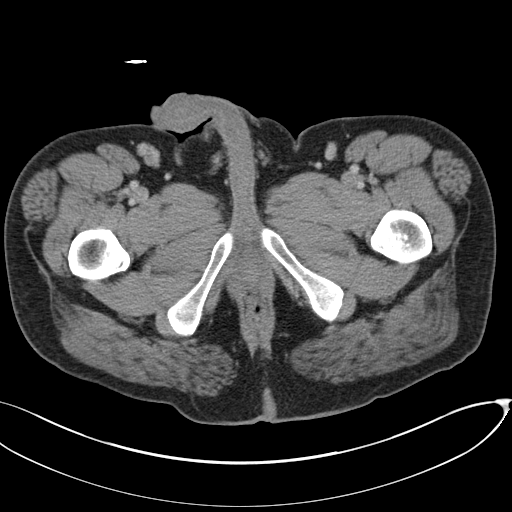
[im 6/108  bone]
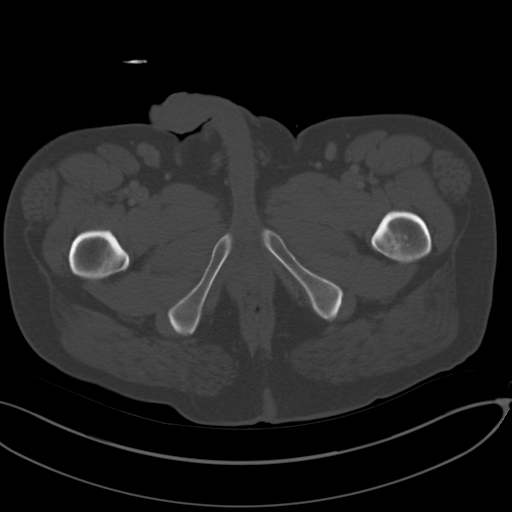
[im 12/108  soft-tissue]
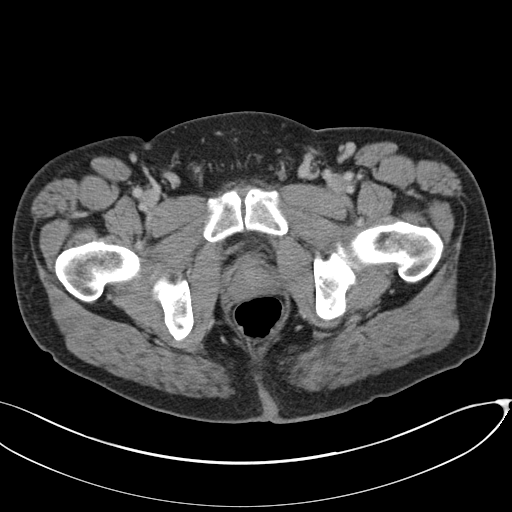
[im 24/108  soft-tissue]
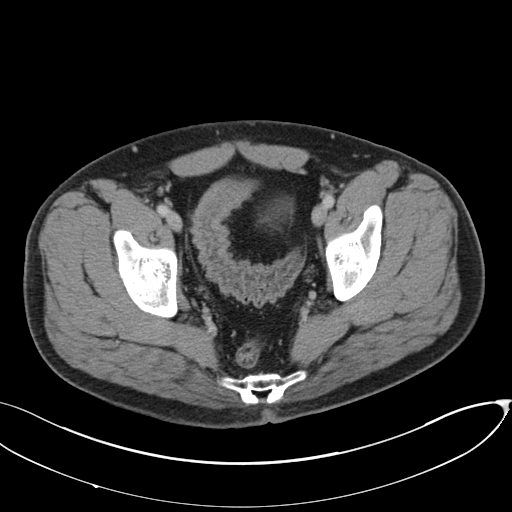
[im 30/108  soft-tissue]
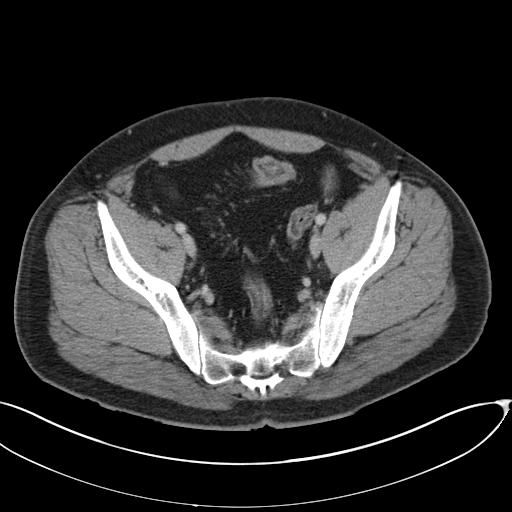
[im 36/108  soft-tissue]
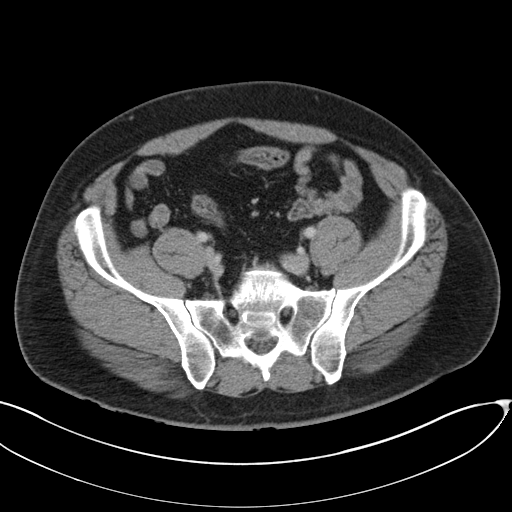
[im 48/108  soft-tissue]
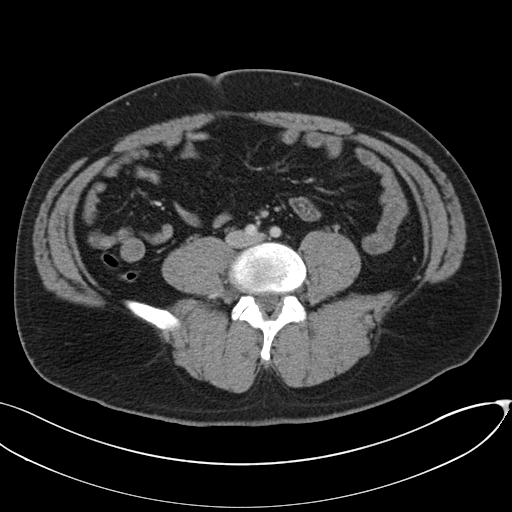
[im 54/108  soft-tissue]
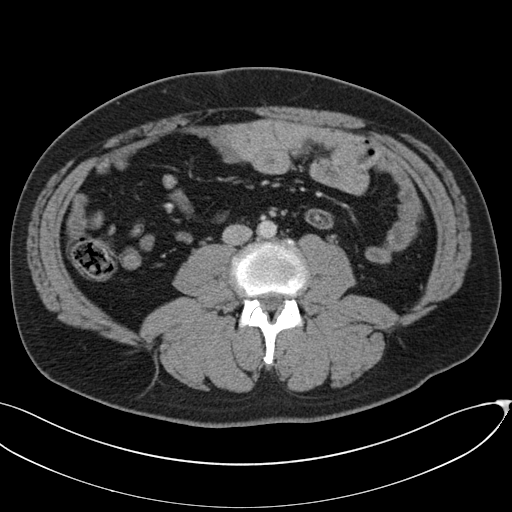
[im 60/108  soft-tissue]
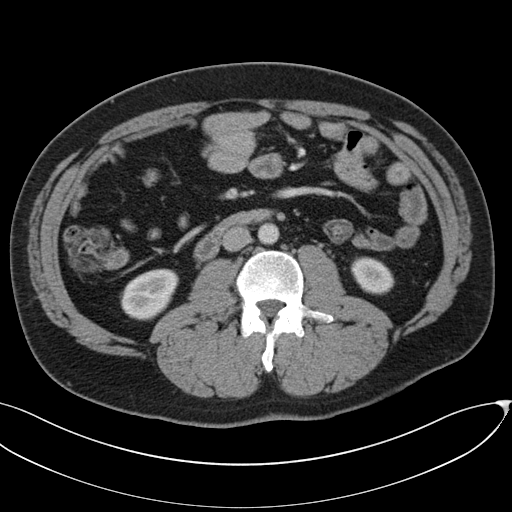
[im 72/108  soft-tissue]
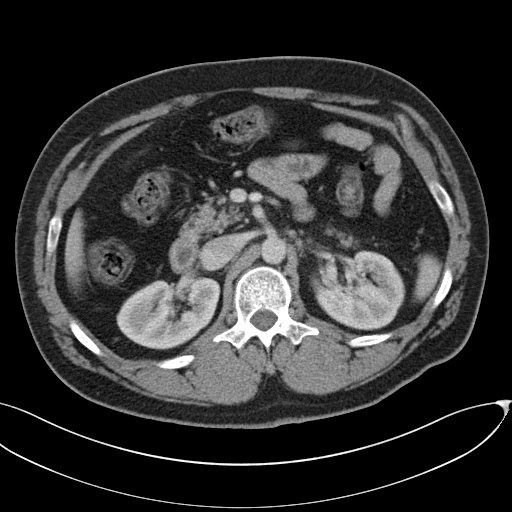
[im 72/108  bone]
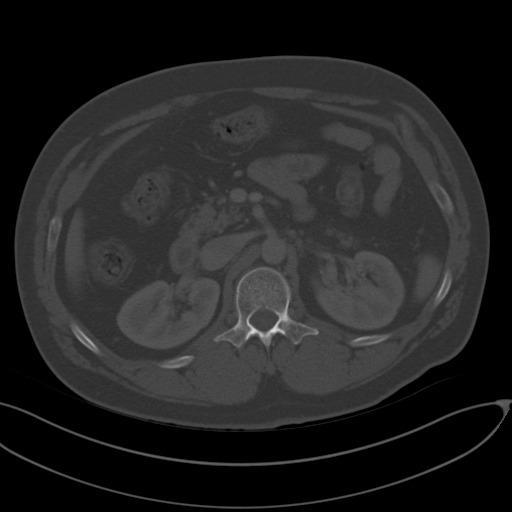
[im 78/108  soft-tissue]
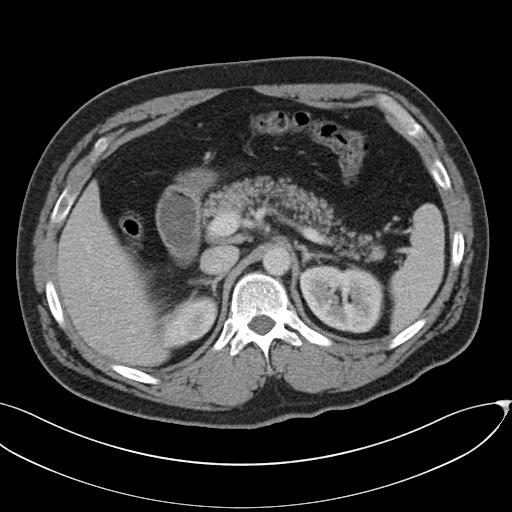
[im 84/108  soft-tissue]
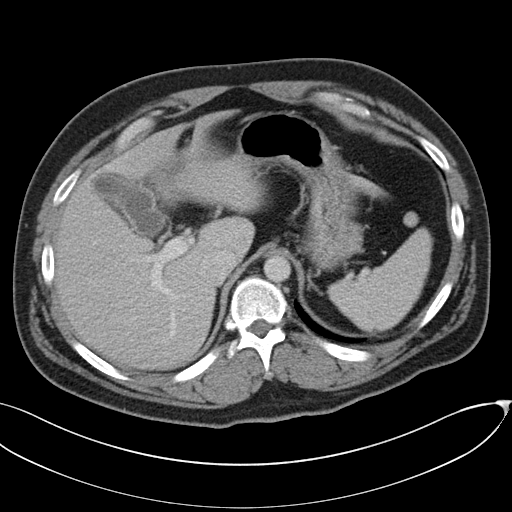
[im 96/108  soft-tissue]
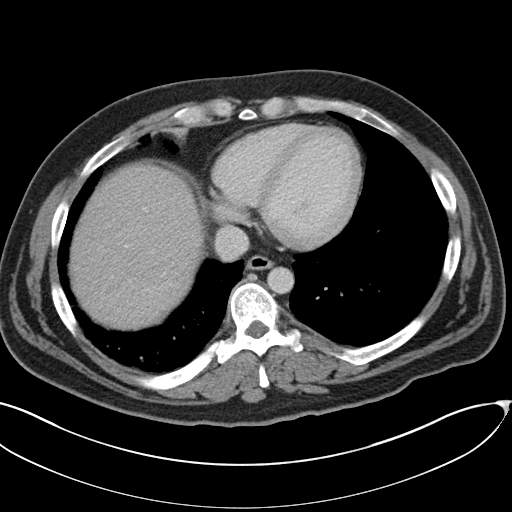
[im 102/108  soft-tissue]
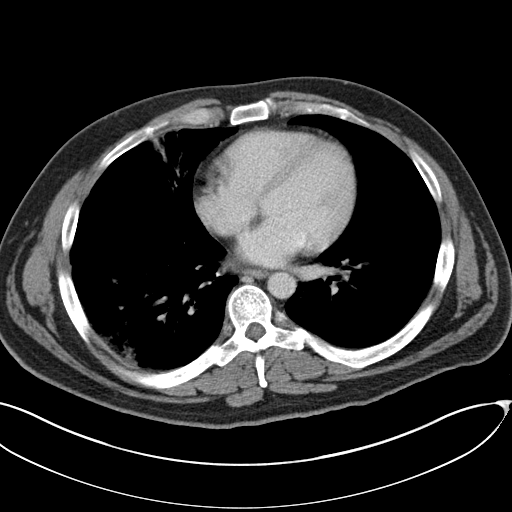

[Series 5: coronal a/|p · coronal · 0.83mm/px · 3 of 163 slices shown]
[im 55/163  soft-tissue]
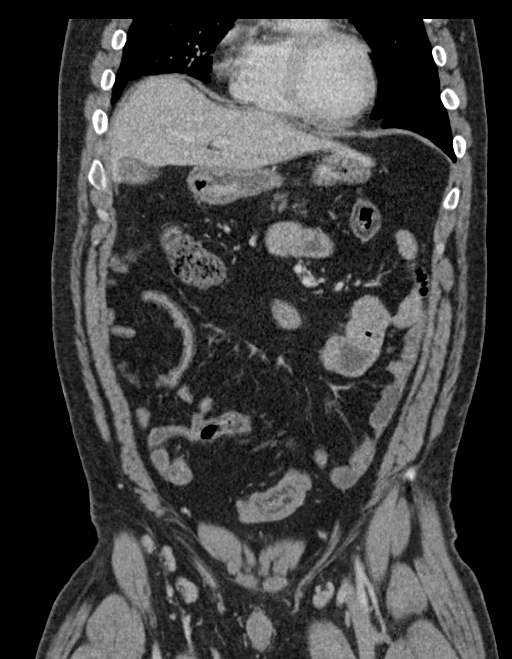
[im 73/163  soft-tissue]
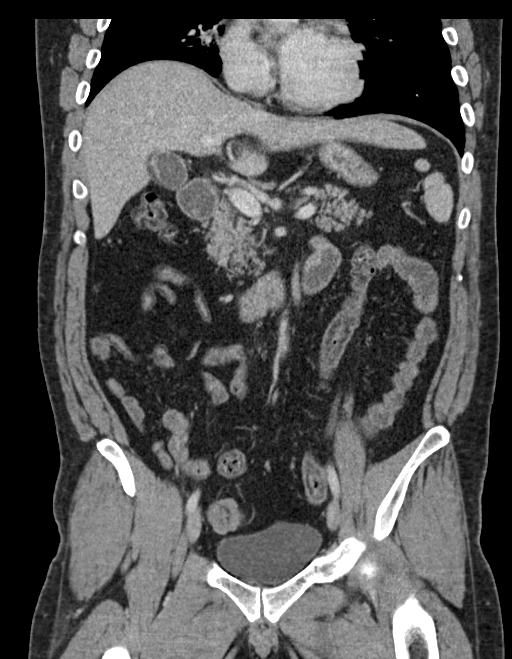
[im 91/163  soft-tissue]
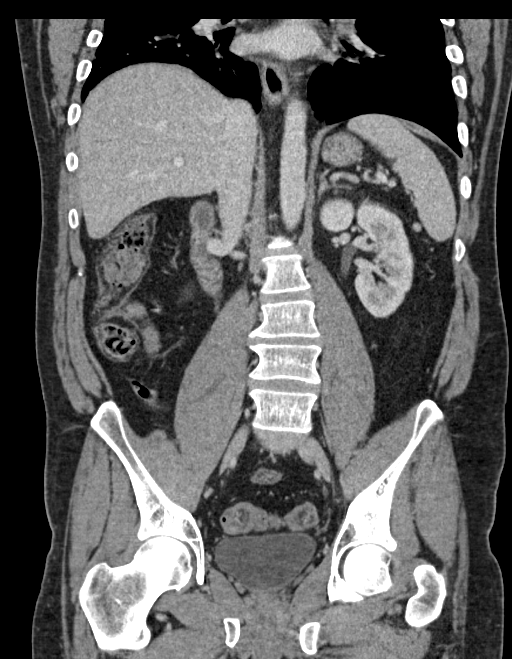

[16 of 46 positions shown; findings below may reference images not displayed]

FINDINGS: Lower chest: Areas of minimal all subpleural nodularity within the
anterior right lung base are unchanged, favored to represent
subpleural lymph nodes. Incompletely imaged right base patchy
opacities are most likely due to atelectasis, given right
hemidiaphragm elevation. Normal heart size without pericardial or
pleural effusion.

Hepatobiliary: Normal liver. Suggestion of gallbladder wall
thickening, including at 8 mm on coronal image 64. No surrounding
pericholecystic edema. No calcified stone or biliary duct
dilatation.

Pancreas: Normal, without mass or ductal dilatation.

Spleen: Normal in size, without focal abnormality.

Adrenals/Urinary Tract: Normal adrenal glands. Normal kidneys,
without hydronephrosis. Normal urinary bladder.

Stomach/Bowel: Normal stomach, without wall thickening. Submucosal
fat prominence is identified throughout the colon. No evidence of
acute inflammation. Scattered colonic diverticula. Normal terminal
ileum and appendix. Normal small bowel.

Vascular/Lymphatic: Normal caliber of the aorta and branch vessels.
No abdominopelvic adenopathy.

Reproductive: Normal prostate.

Other: No significant free fluid.

Musculoskeletal: No acute osseous abnormality.
IMPRESSION: 1.  No acute process or explanation for right lower quadrant pain.
2. Submucosal fat prominence throughout the colon. This is a
nonspecific finding. Although it is associated with chronic
inflammatory bowel disease, it has also been described in normal
variants.
3. Suspicion of gallbladder wall thickening, without evidence of
pericholecystic edema or stone. Correlate with right upper quadrant
symptoms. If any concern of cholecystitis, consider ultrasound.
4. Right hemidiaphragm elevation with patchy right base opacities,
favoring atelectasis.

## 2016-10-30 MED ORDER — ONDANSETRON 4 MG PO TBDP
4.0000 mg | ORAL_TABLET | Freq: Once | ORAL | Status: AC | PRN
  Administered 2016-10-30: 4 mg via ORAL
  Filled 2016-10-30: qty 1

## 2016-10-30 MED ORDER — PROMETHAZINE HCL 25 MG/ML IJ SOLN
12.5000 mg | Freq: Once | INTRAMUSCULAR | Status: AC
  Administered 2016-10-30: 12.5 mg via INTRAVENOUS
  Filled 2016-10-30: qty 1

## 2016-10-30 MED ORDER — HALOPERIDOL LACTATE 5 MG/ML IJ SOLN
5.0000 mg | Freq: Once | INTRAMUSCULAR | Status: AC
  Administered 2016-10-30: 5 mg via INTRAVENOUS
  Filled 2016-10-30: qty 1

## 2016-10-30 MED ORDER — IOPAMIDOL (ISOVUE-300) INJECTION 61%
INTRAVENOUS | Status: AC
  Filled 2016-10-30: qty 100

## 2016-10-30 MED ORDER — SODIUM CHLORIDE 0.9 % IV BOLUS (SEPSIS)
1000.0000 mL | Freq: Once | INTRAVENOUS | Status: AC
  Administered 2016-10-30: 1000 mL via INTRAVENOUS

## 2016-10-30 MED ORDER — IOPAMIDOL (ISOVUE-300) INJECTION 61%
100.0000 mL | Freq: Once | INTRAVENOUS | Status: AC | PRN
  Administered 2016-10-30: 100 mL via INTRAVENOUS

## 2016-10-30 MED FILL — HYDROCHLOROTHIAZIDE 25 MG T: 25 | 30 days supply | Qty: 30 | Fill #1

## 2016-10-30 MED FILL — AMITRIPTYLINE HCL 25 MG TAB: 25 | 30 days supply | Qty: 30 | Fill #1

## 2016-10-30 MED FILL — ?PANTOPRAZOLE SOD DR 40MG: 40 MG | 30 days supply | Qty: 30 | Fill #1

## 2016-10-30 NOTE — Telephone Encounter (Signed)
Patient's wife called the office to speak with PCP regarding husband's current status. Pt is in extreme pain. Please follow up.  Thank you.

## 2016-10-30 NOTE — ED Triage Notes (Addendum)
Per [facility], Pt, from home, c/o R side abdominal pain and n/v x 9 days.  Pain score 10/10.  Denies diarrhea.  Hx of diverticulitis.  Pt has not taken anything for symptoms.  Sts he can not get into PCP until next month.

## 2016-10-30 NOTE — Discharge Instructions (Signed)
Please follow up with Your GI specialist and your primary care provider for further management of your abdominal pain. Please avoid marijuana use as may worsen your condition.

## 2016-10-30 NOTE — ED Provider Notes (Signed)
[facility] DEPT Provider Note   CSN: [id number] Arrival date & time: 10/30/16  0856     History   Chief Complaint Chief Complaint  Patient presents with  . Abdominal Pain  . Emesis    HPI Eric Peck is a 41 y.o. male.  HPI   41 year old male with history of chronic abdominal pain, diverticulitis, GERD, anxiety presented ED via EMS from home for evaluation of abdominal pain. Patient here complaining of pain to his right upper quadrant. He described pain as a crampy persistent pain. Pain improves with leaning forward, worsening when he lies flat or when he eats. This pain has been an ongoing issue for more than a year but worsened in the past 9 days. Pain is currently rated as 10 out of 10, persistent, nonradiating, with decreased appetite. Reported increasing pain with eating. Endorse occasional nonbloody nonbilious vomiting, and having occasional constipation alternating with diarrhea. Last bowel movement was today. Patient has tried taking PPI, and other medication prescribed by his doctor for this but recently has not helped. He admits that he has been using pot to help with his abdominal pain which has not helped. He denies having fever, chills, URI symptoms, chest pain, shortness of breath, productive cough, dysuria, hematuria, hematochezia or melena. He still has an intact gallbladder. He denies alcohol abuse. He was told that he has stomach ulcer by his GI specialist in the past. He also reported history of perforated diverticulitis with similar pain.  Past Medical History:  Diagnosis Date  . Anxiety   . Arthritis   . CTS (carpal tunnel syndrome)   . Diverticulitis of large intestine with perforation 03/2015   microperf of sigmoid colitis, tx with abx, no surg  . GERD (gastroesophageal reflux disease)     Patient Active Problem List   Diagnosis Date Noted  . Tobacco abuse 05/24/2016  . Diverticulosis of colon without hemorrhage 05/24/2016  . Constipation 05/24/2016    . Chronic RUQ pain 04/12/2016    Past Surgical History:  Procedure Laterality Date  . COLONOSCOPY N/A 04/23/2015   Procedure: DIAGNOSTIC COLONOSCOPY;  Surgeon: [name], MD;  Location: [facility] END[facility]COPY;  Service: Endoscopy;  Laterality: N/A;  . VASECTOMY         Home Medications    Prior to Admission medications   Medication Sig Start Date End Date Taking? Authorizing Provider  amitriptyline (ELAVIL) 25 MG tablet Take 1 tablet (25 mg total) by mouth at bedtime. 08/29/16  Yes Maren Reamer, MD  hydrochlorothiazide (HYDRODIURIL) 25 MG tablet Take 1 tablet (25 mg total) by mouth daily. 08/29/16  Yes Maren Reamer, MD  ibuprofen (ADVIL,MOTRIN) 200 MG tablet Take 400-800 mg by mouth every 6 (six) hours as needed for headache or mild pain. Reported on 04/12/2016   Yes Historical Provider, MD  ondansetron (ZOFRAN) 4 MG tablet Take 1 tablet (4 mg total) by mouth every 6 (six) hours as needed for nausea or vomiting. 10/24/16  Yes Maren Reamer, MD  pantoprazole (PROTONIX) 40 MG tablet Take 1 tablet (40 mg total) by mouth daily. 08/29/16  Yes Maren Reamer, MD  pravastatin (PRAVACHOL) 20 MG tablet Take 1 tablet (20 mg total) by mouth daily. Patient not taking: Reported on 10/30/2016 08/30/16   Maren Reamer, MD    Family History Family History  Problem Relation Age of Onset  . COPD Mother   . Drug abuse Mother     EtOH  . Inflammatory bowel disease Mother   . Cystic  fibrosis Mother   . Irritable bowel syndrome Mother   . Diabetes Father   . Kidney disease Father   . Eczema Brother   . Irritable bowel syndrome Brother   . Mental retardation Brother     Down's Syndrome  . Breast cancer Maternal Grandmother   . Colitis Maternal Grandmother   . Breast cancer Paternal Grandmother   . Prostate cancer Paternal Uncle     x 2  . Crohn's disease Cousin     x 2    Social History Social History  Substance Use Topics  . Smoking status: Current Every Day Smoker     Packs/day: 0.50    Types: Cigarettes  . Smokeless tobacco: Never Used     Comment: form given 08-02-16  . Alcohol use No     Allergies   Patient has no known allergies.   Review of Systems Review of Systems  All other systems reviewed and are negative.    Physical Exam Updated Vital Signs BP 162/84   Pulse 85   Temp 98.4 F (36.9 C)   Resp 22   SpO2 98%   Physical Exam  Constitutional: He appears well-developed and well-nourished.  Patient is hunching over, holding his abdomen, moaning  HENT:  Head: Atraumatic.  Throat: Posterior oropharyngeal erythema with uvula enlargement but no tonsillar enlargement or exudates no trismus   Eyes: Conjunctivae are normal.  Neck: Neck supple.  Cardiovascular: Normal rate and regular rhythm.   Pulmonary/Chest: Effort normal and breath sounds normal.  Abdominal: Soft. He exhibits no distension. There is tenderness (Diffuse abdominal tenderness without guarding or rebound tenderness. Negative Murphy sign, no pain at McBurney's point.).  Neurological: He is alert.  Skin: No rash noted.  Psychiatric: He has a normal mood and affect.  Nursing note and vitals reviewed.    ED Treatments / Results  Labs (all labs ordered are listed, but only abnormal results are displayed) Labs Reviewed  COMPREHENSIVE METABOLIC PANEL - Abnormal; Notable for the following:       Result Value   Potassium 3.4 (*)    Glucose, Bld 104 (*)    All other components within normal limits  URINALYSIS, ROUTINE W REFLEX MICROSCOPIC - Abnormal; Notable for the following:    Color, Urine AMBER (*)    APPearance CLOUDY (*)    Bacteria, UA RARE (*)    Squamous Epithelial / LPF 0-5 (*)    All other components within normal limits  LIPASE, BLOOD  CBC    EKG  EKG Interpretation None       Radiology Ct Abdomen Pelvis W Contrast  Result Date: 10/30/2016 CLINICAL DATA:  Right lower quadrant pain since 10/26/2016. History of perforated diverticulitis.  Gastroesophageal reflux disease. EXAM: CT ABDOMEN AND PELVIS WITH CONTRAST TECHNIQUE: Multidetector CT imaging of the abdomen and pelvis was performed using the standard protocol following bolus administration of intravenous contrast. CONTRAST:  121mL ISOVUE-300 IOPAMIDOL (ISOVUE-300) INJECTION 61% COMPARISON:  12/21/2015 FINDINGS: Lower chest: Areas of minimal all subpleural nodularity within the anterior right lung base are unchanged, favored to represent subpleural lymph nodes. Incompletely imaged right base patchy opacities are most likely due to atelectasis, given right hemidiaphragm elevation. Normal heart size without pericardial or pleural effusion. Hepatobiliary: Normal liver. Suggestion of gallbladder wall thickening, including at 8 mm on coronal image 64. No surrounding pericholecystic edema. No calcified stone or biliary duct dilatation. Pancreas: Normal, without mass or ductal dilatation. Spleen: Normal in size, without focal abnormality. Adrenals/Urinary Tract: Normal adrenal glands.  Normal kidneys, without hydronephrosis. Normal urinary bladder. Stomach/Bowel: Normal stomach, without wall thickening. Submucosal fat prominence is identified throughout the colon. No evidence of acute inflammation. Scattered colonic diverticula. Normal terminal ileum and appendix. Normal small bowel. Vascular/Lymphatic: Normal caliber of the aorta and branch vessels. No abdominopelvic adenopathy. Reproductive: Normal prostate. Other: No significant free fluid. Musculoskeletal: No acute osseous abnormality. IMPRESSION: 1.  No acute process or explanation for right lower quadrant pain. 2. Submucosal fat prominence throughout the colon. This is a nonspecific finding. Although it is associated with chronic inflammatory bowel disease, it has also been described in normal variants. 3. Suspicion of gallbladder wall thickening, without evidence of pericholecystic edema or stone. Correlate with right upper quadrant symptoms. If  any concern of cholecystitis, consider ultrasound. 4. Right hemidiaphragm elevation with patchy right base opacities, favoring atelectasis. Electronically Signed   By: Abigail Miyamoto M.D.   On: 10/30/2016 14:08   US Abdomen Limited  Result Date: 10/30/2016 CLINICAL DATA:  Chronic right upper quadrant pain for 2 years. Abnormal gallbladder by CT earlier today. EXAM: US ABDOMEN LIMITED - RIGHT UPPER QUADRANT COMPARISON:  10/30/2016 CT with contrast FINDINGS: Gallbladder: Negative for gallstones. No Murphys sign elicited. Gallbladder wall is thickened 6 mm. Common bile duct: Diameter: 3.8 mm Liver: No focal lesion identified. Within normal limits in parenchymal echogenicity. Patent portal and hepatic veins with normal directional flow. No upper abdominal ascites. IMPRESSION: Gallbladder wall thickening measuring up to 6 mm but no associated cholelithiasis or Murphy's sign and. This may reflect chronic cholecystitis. No biliary dilatation. Electronically Signed   By: Jerilynn Mages.  Shick M.D.   On: 10/30/2016 15:32    Procedures Procedures (including critical care time)  Medications Ordered in ED Medications  iopamidol (ISOVUE-300) 61 % injection (not administered)  ondansetron (ZOFRAN-ODT) disintegrating tablet 4 mg (4 mg Oral Given 10/30/16 0915)  sodium chloride 0.9 % bolus 1,000 mL (0 mLs Intravenous Stopped 10/30/16 1332)  haloperidol lactate (HALDOL) injection 5 mg (5 mg Intravenous Given 10/30/16 1144)  promethazine (PHENERGAN) injection 12.5 mg (12.5 mg Intravenous Given 10/30/16 1207)  iopamidol (ISOVUE-300) 61 % injection 100 mL (100 mLs Intravenous Contrast Given 10/30/16 1336)     Initial Impression / Assessment and Plan / ED Course  I have reviewed the triage vital signs and the nursing notes.  Pertinent labs & imaging results that were available during my care of the patient were reviewed by me and considered in my medical decision making (see chart for details).     BP 135/78   Pulse 62   Temp  98.4 F (36.9 C)   Resp 18   SpO2 95%    Final Clinical Impressions(s) / ED Diagnoses   Final diagnoses:  Chronic abdominal pain    New Prescriptions New Prescriptions   No medications on file   11:46 AM Patient here with acute on chronic abdominal pain. Pain localized at right upper quadrant which is his usual pain medication. He does appears very uncomfortable but he has a nonsurgical abdomen. Does have history of perforated diverticulitis in the past therefore he will benefit from advanced imaging to rule out acute complication. History of marijuana use which may contribute to his chronic abdominal pain. Concern for potential cannabinoid hyperemesis syndrome. I have review his previous charts, he was seen in the management by GI specialist, Dr. Carlean Purl fairly recently. It was noted that he has some mild inflammation of the gastrointestinal but no specific finding to correlate with his actual pain. We'll focus on providing  symptomatic treatment.  3:47 PM Initial abdominal and pelvis CT scan with mild gallbladder wall thickening without stones. No other concerning finding. No evidence of diverticulitis. Given the location of his pain, an abdominal ultrasound obtained demonstrate chronic inflammations of the gallbladder without gallstone. Patient felt much better after receiving treatment while in the ER. He is not actively vomiting and resting comfortably. Encouraged patient to follow-up with his primary care provider and his GI specialist for further care. Recommend avoid marijuana use.   Domenic Moras, PA-C 10/30/16 [place], MD 10/31/16 [phone]

## 2016-11-01 NOTE — Telephone Encounter (Signed)
LMOVM to return call.

## 2016-11-06 ENCOUNTER — Encounter: Payer: Self-pay | Admitting: Internal Medicine

## 2016-11-06 ENCOUNTER — Ambulatory Visit: Payer: Self-pay | Attending: Internal Medicine | Admitting: Internal Medicine

## 2016-11-06 VITALS — BP 141/81 | HR 86 | Temp 98.3°F | Resp 16 | Wt 235.4 lb

## 2016-11-06 DIAGNOSIS — F325 Major depressive disorder, single episode, in full remission: Secondary | ICD-10-CM | POA: Insufficient documentation

## 2016-11-06 DIAGNOSIS — Z720 Tobacco use: Secondary | ICD-10-CM | POA: Insufficient documentation

## 2016-11-06 DIAGNOSIS — I10 Essential (primary) hypertension: Secondary | ICD-10-CM

## 2016-11-06 DIAGNOSIS — R197 Diarrhea, unspecified: Secondary | ICD-10-CM | POA: Insufficient documentation

## 2016-11-06 DIAGNOSIS — K819 Cholecystitis, unspecified: Secondary | ICD-10-CM

## 2016-11-06 DIAGNOSIS — Z79899 Other long term (current) drug therapy: Secondary | ICD-10-CM | POA: Insufficient documentation

## 2016-11-06 DIAGNOSIS — K810 Acute cholecystitis: Secondary | ICD-10-CM | POA: Insufficient documentation

## 2016-11-06 DIAGNOSIS — R1011 Right upper quadrant pain: Secondary | ICD-10-CM | POA: Insufficient documentation

## 2016-11-06 DIAGNOSIS — G8929 Other chronic pain: Secondary | ICD-10-CM | POA: Insufficient documentation

## 2016-11-06 HISTORY — DX: Essential (primary) hypertension: I10

## 2016-11-06 MED ORDER — AMITRIPTYLINE HCL 25 MG PO TABS: 25.0000 mg | ORAL_TABLET | Freq: Every day | ORAL | 3 refills | Status: DC

## 2016-11-06 MED ORDER — HYDROCHLOROTHIAZIDE 25 MG PO TABS: 25.0000 mg | ORAL_TABLET | Freq: Every day | ORAL | 3 refills | Status: DC

## 2016-11-06 MED ORDER — GABAPENTIN 300 MG PO CAPS: 300.0000 mg | ORAL_CAPSULE | Freq: Every day | ORAL | 3 refills | Status: DC

## 2016-11-06 MED ORDER — ESCITALOPRAM OXALATE 20 MG PO TABS: 20.0000 mg | ORAL_TABLET | Freq: Every day | ORAL | 1 refills | Status: DC

## 2016-11-06 MED FILL — GABAPENTIN 300 MG CAPSULE: 300 | 30 days supply | Qty: 30 | Fill #0

## 2016-11-06 MED FILL — ?ESCITALOPRAM 20 MG TABLET: 20 | 30 days supply | Qty: 30 | Fill #0

## 2016-11-06 NOTE — Patient Instructions (Addendum)
Financial aid packet -//   Cholecystitis Introduction Cholecystitis is swelling and irritation (inflammation) of the gallbladder. The gallbladder is an organ that is shaped like a pear. It is under the liver on the right side of the body. This condition is often caused by gallstones. You doctor may do tests to see how your gallbladder works. These tests may include:  Imaging tests, such as:  An ultrasound.  MRI.  Tests that check how your liver works. This condition needs treatment. Follow these instructions at home: Home care will depend on your treatment. In general:  Take over-the-counter and prescription medicines only as told by your doctor.  If you were prescribed an antibiotic medicine, take it as told by your doctor. Do not stop taking the antibiotic even if you start to feel better.  Follow instructions from your doctor about what to eat or drink. When you are allowed to eat, avoid eating or drinking anything that causes your symptoms to start.  Keep all follow-up visits as told by your doctor. This is important. Contact a doctor if:  You have pain and your medicine does not help.  You have a fever. Get help right away if:  Your pain moves to:  Another part of your belly (abdomen).  Your back.  Your symptoms do not go away.  You have new symptoms. This information is not intended to replace advice given to you by your health care provider. Make sure you discuss any questions you have with your health care provider. Document Released: 09/07/2011 Document Revised: 02/24/2016 Document Reviewed: 12/30/2014  2017 Elsevier   - Low-Fat Diet for Pancreatitis or Gallbladder Conditions A low-fat diet can be helpful if you have pancreatitis or a gallbladder condition. With these conditions, your pancreas and gallbladder have trouble digesting fats. A healthy eating plan with less fat will help rest your pancreas and gallbladder and reduce your symptoms. What do I need  to know about this diet?  Eat a low-fat diet.  Reduce your fat intake to less than 20-30% of your total daily calories. This is less than 50-60 g of fat per day.  Remember that you need some fat in your diet. Ask your dietician what your daily goal should be.  Choose nonfat and low-fat healthy foods. Look for the words "nonfat," "low fat," or "fat free."  As a guide, look on the label and choose foods with less than 3 g of fat per serving. Eat only one serving.  Avoid alcohol.  Do not smoke. If you need help quitting, talk with your health care provider.  Eat small frequent meals instead of three large heavy meals. What foods can I eat? Grains  Include healthy grains and starches such as potatoes, wheat bread, fiber-rich cereal, and brown rice. Choose whole grain options whenever possible. In adults, whole grains should account for 45-65% of your daily calories. Fruits and Vegetables  Eat plenty of fruits and vegetables. Fresh fruits and vegetables add fiber to your diet. Meats and Other Protein Sources  Eat lean meat such as chicken and pork. Trim any fat off of meat before cooking it. Eggs, fish, and beans are other sources of protein. In adults, these foods should account for 10-35% of your daily calories. Dairy  Choose low-fat milk and dairy options. Dairy includes fat and protein, as well as calcium. Fats and Oils  Limit high-fat foods such as fried foods, sweets, baked goods, sugary drinks. Other  Creamy sauces and condiments, such as mayonnaise, can add  extra fat. Think about whether or not you need to use them, or use smaller amounts or low fat options. What foods are not recommended?  High fat foods, such as:  [employer].  Ice cream.  [place] toast.  Sweet rolls.  Pizza.  Cheese bread.  Foods covered with batter, butter, creamy sauces, or cheese.  Fried foods.  Sugary drinks and desserts.  Foods that cause gas or bloating This information is not intended  to replace advice given to you by your health care provider. Make sure you discuss any questions you have with your health care provider. Document Released: 09/23/2013 Document Revised: 02/24/2016 Document Reviewed: 09/01/2013 Elsevier Interactive Patient Education  2017 Elsevier Inc.  -  Low-Sodium Eating Plan Sodium raises blood pressure and causes water to be held in the body. Getting less sodium from food will help lower your blood pressure, reduce any swelling, and protect your heart, liver, and kidneys. We get sodium by adding salt (sodium chloride) to food. Most of our sodium comes from canned, boxed, and frozen foods. Restaurant foods, fast foods, and pizza are also very high in sodium. Even if you take medicine to lower your blood pressure or to reduce fluid in your body, getting less sodium from your food is important. What is my plan? Most people should limit their sodium intake to 2,300 mg a day. Your health care provider recommends that you limit your sodium intake to ___000mg  a day. What do I need to know about this eating plan? For the low-sodium eating plan, you will follow these general guidelines:  Choose foods with a % Daily Value for sodium of less than 5% (as listed on the food label).  Use salt-free seasonings or herbs instead of table salt or sea salt.  Check with your health care provider or pharmacist before using salt substitutes.  Eat fresh foods.  Eat more vegetables and fruits.  Limit canned vegetables. If you do use them, rinse them well to decrease the sodium.  Limit cheese to 1 oz (28 g) per day.  Eat lower-sodium products, often labeled as "lower sodium" or "no salt added."  Avoid foods that contain monosodium glutamate (MSG). MSG is sometimes added to [place] food and some canned foods.  Check food labels (Nutrition Facts labels) on foods to learn how much sodium is in one serving.  Eat more home-cooked food and less restaurant, buffet, and fast  food.  When eating at a restaurant, ask that your food be prepared with less salt, or no salt if possible. How do I read food labels for sodium information? The Nutrition Facts label lists the amount of sodium in one serving of the food. If you eat more than one serving, you must multiply the listed amount of sodium by the number of servings. Food labels may also identify foods as:  Sodium free-Less than 5 mg in a serving.  Very low sodium-35 mg or less in a serving.  Low sodium-140 mg or less in a serving.  Light in sodium-50% less sodium in a serving. For example, if a food that usually has 300 mg of sodium is changed to become light in sodium, it will have 150 mg of sodium.  Reduced sodium-25% less sodium in a serving. For example, if a food that usually has 400 mg of sodium is changed to reduced sodium, it will have 300 mg of sodium. What foods can I eat? Grains  Low-sodium cereals, including oats, puffed wheat and rice, and shredded wheat cereals.  Low-sodium crackers. Unsalted rice and pasta. Lower-sodium bread. Vegetables  Frozen or fresh vegetables. Low-sodium or reduced-sodium canned vegetables. Low-sodium or reduced-sodium tomato sauce and paste. Low-sodium or reduced-sodium tomato and vegetable juices. Fruits  Fresh, frozen, and canned fruit. Fruit juice. Meat and Other Protein Products  Low-sodium canned tuna and salmon. Fresh or frozen meat, poultry, seafood, and fish. Lamb. Unsalted nuts. Dried beans, peas, and lentils without added salt. Unsalted canned beans. Homemade soups without salt. Eggs. Dairy  Milk. Soy milk. Ricotta cheese. Low-sodium or reduced-sodium cheeses. Yogurt. Condiments  Fresh and dried herbs and spices. Salt-free seasonings. Onion and garlic powders. Low-sodium varieties of mustard and ketchup. Fresh or refrigerated horseradish. Lemon juice. Fats and Oils  Reduced-sodium salad dressings. Unsalted butter. Other  Unsalted popcorn and pretzels. The  items listed above may not be a complete list of recommended foods or beverages. Contact your dietitian for more options.  What foods are not recommended? Grains  Instant hot cereals. Bread stuffing, pancake, and biscuit mixes. Croutons. Seasoned rice or pasta mixes. Noodle soup cups. Boxed or frozen macaroni and cheese. Self-rising flour. Regular salted crackers. Vegetables  Regular canned vegetables. Regular canned tomato sauce and paste. Regular tomato and vegetable juices. Frozen vegetables in sauces. Salted [place] fries. Olives. Angie Fava. Relishes. Sauerkraut. Salsa. Meat and Other Protein Products  Salted, canned, smoked, spiced, or pickled meats, seafood, or fish. Bacon, ham, sausage, hot dogs, corned beef, chipped beef, and packaged luncheon meats. Salt pork. Jerky. Pickled herring. Anchovies, regular canned tuna, and sardines. Salted nuts. Dairy  Processed cheese and cheese spreads. Cheese curds. Blue cheese and cottage cheese. Buttermilk. Condiments  Onion and garlic salt, seasoned salt, table salt, and sea salt. Canned and packaged gravies. Worcestershire sauce. Tartar sauce. Barbecue sauce. Teriyaki sauce. Soy sauce, including reduced sodium. Steak sauce. Fish sauce. Oyster sauce. Cocktail sauce. Horseradish that you find on the shelf. Regular ketchup and mustard. Meat flavorings and tenderizers. Bouillon cubes. Hot sauce. Tabasco sauce. Marinades. Taco seasonings. Relishes. Fats and Oils  Regular salad dressings. Salted butter. Margarine. Ghee. Bacon fat. Other  Potato and tortilla chips. Corn chips and puffs. Salted popcorn and pretzels. Canned or dried soups. Pizza. Frozen entrees and pot pies. The items listed above may not be a complete list of foods and beverages to avoid. Contact your dietitian for more information.  This information is not intended to replace advice given to you by your health care provider. Make sure you discuss any questions you have with your health care  provider. Document Released: 03/10/2002 Document Revised: 02/24/2016 Document Reviewed: 07/23/2013 Elsevier Interactive Patient Education  2017 [employer].

## 2016-11-06 NOTE — Progress Notes (Signed)
Eric Peck, is a 41 y.o. male  [id number]  [id number]  DOB - October 16, 1975  Chief Complaint  Patient presents with  . Follow-up        Subjective:   Eric Peck is a 41 y.o. male here today for a follow up visit for chronic ruq abd pain. He was recently seen in  ED 10/2916 for ruq abd pain and off/off diarrhea. He states his bms vary, sometimes daily, sometimes every other day.  Denies current diarrhea.  Of note, has chronic ruq pain, constant in nature, intolerable at this time. Only time he gets relief is at night when he takes the elavil and neurontin.     Patient has No headache, No chest pain, No abdominal pain - No Nausea, No new weakness tingling or numbness, No Cough - SOB.  No problems updated.  ALLERGIES: No Known Allergies  PAST MEDICAL HISTORY: Past Medical History:  Diagnosis Date  . Anxiety   . Arthritis   . CTS (carpal tunnel syndrome)   . Diverticulitis of large intestine with perforation 03/2015   microperf of sigmoid colitis, tx with abx, no surg  . GERD (gastroesophageal reflux disease)     MEDICATIONS AT HOME: Prior to Admission medications   Medication Sig Start Date End Date Taking? Authorizing Provider  amitriptyline (ELAVIL) 25 MG tablet Take 1 tablet (25 mg total) by mouth at bedtime. 11/06/16   Maren Reamer, MD  escitalopram (LEXAPRO) 20 MG tablet Take 1 tablet (20 mg total) by mouth daily. 11/06/16   Maren Reamer, MD  gabapentin (NEURONTIN) 300 MG capsule Take 1 capsule (300 mg total) by mouth at bedtime. 11/06/16   Maren Reamer, MD  hydrochlorothiazide (HYDRODIURIL) 25 MG tablet Take 1 tablet (25 mg total) by mouth daily. 11/06/16   Maren Reamer, MD  ibuprofen (ADVIL,MOTRIN) 200 MG tablet Take 400-800 mg by mouth every 6 (six) hours as needed for headache or mild pain. Reported on 04/12/2016    Historical Provider, MD  ondansetron (ZOFRAN) 4 MG tablet Take 1 tablet (4 mg total) by mouth every 6 (six) hours as needed for nausea  or vomiting. 10/24/16   Maren Reamer, MD  pantoprazole (PROTONIX) 40 MG tablet Take 1 tablet (40 mg total) by mouth daily. 08/29/16   Maren Reamer, MD  pravastatin (PRAVACHOL) 20 MG tablet Take 1 tablet (20 mg total) by mouth daily. Patient not taking: Reported on 10/30/2016 08/30/16   Maren Reamer, MD     Objective:   Vitals:   11/06/16 1030  BP: (!) 141/81  Pulse: 86  Resp: 16  Temp: 98.3 F (36.8 C)  TempSrc: Oral  SpO2: 95%  Weight: 235 lb 6.4 oz (106.8 kg)    Exam General appearance : Awake, alert, not in any distress. Speech Clear. Not toxic looking HEENT: Atraumatic and Normocephalic, pupils equally reactive to light. Neck: supple, no JVD. No cervical lymphadenopathy.  Chest:Good air entry bilaterally, no added sounds. CVS: S1 S2 regular, no murmurs/gallups or rubs. Abdomen: Bowel sounds active, Non tender and not distended with no gaurding, rigidity or rebound. Extremities: B/L Lower Ext shows no edema, both legs are warm to touch Neurology: Awake alert, and oriented X 3, CN II-XII grossly intact, Non focal Skin:No Rash  Data Review Lab Results  Component Value Date   HGBA1C 5.4 08/29/2016    Depression screen PHQ 2/9 11/06/2016 08/29/2016 06/26/2016 05/24/2016 04/12/2016  Decreased Interest 3 3 3  0 2  Down, Depressed, Hopeless 3  3 3 1 2   PHQ - 2 Score 6 6 6 1 4   Altered sleeping 3 3 3 3 2   Tired, decreased energy 3 3 3 3 3   Change in appetite 3 3 3 3 3   Feeling bad or failure about yourself  3 3 3 3  -  Trouble concentrating 3 3 3 3 2   Moving slowly or fidgety/restless (No Data) (No Data) 0 1 1  Suicidal thoughts (No Data) (No Data) 3 0 2  PHQ-9 Score 21 21 24 17 17     Ct abd 10/30/16 IMPRESSION: 1.  No acute process or explanation for right lower quadrant pain. 2. Submucosal fat prominence throughout the colon. This is a nonspecific finding. Although it is associated with chronic inflammatory bowel disease, it has also been described in  normal variants. 3. Suspicion of gallbladder wall thickening, without evidence of pericholecystic edema or stone. Correlate with right upper quadrant symptoms. If any concern of cholecystitis, consider ultrasound. 4. Right hemidiaphragm elevation with patchy right base opacities, favoring atelectasis.   Electronically Signed   By: Abigail Miyamoto M.D.   On: 10/30/2016 14:08  ruq [place] 10/30/16 IMPRESSION: Gallbladder wall thickening measuring up to 6 mm but no associated cholelithiasis or Murphy's sign and. This may reflect chronic cholecystitis.  No biliary dilatation.   Electronically Signed   By: Jerilynn Mages.  Shick M.D.   On: 10/30/2016 15:32   04/26/16 hida IMPRESSION: Normal ejection fraction of radiotracer from the gallbladder. The patient did experience nausea with the oral Ensure Complete consumption. Cystic and common bile ducts are patent as is evidenced by visualization of gallbladder and small bowel. Electronically Signed   By: Lowella Grip III M.D.   On: 04/26/2016 13:23  Assessment & Plan   1. 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 - hida 7/17 unremarkable, but recent CT abd and ruq [place] points toward gb wall thickening - needs surgical eval, but uninsured  Currently.  Once he gets orange card/cone discount, he will notify me and I can put in referral. - continue elavil 25 qhs and neurontin 300 qhs  - renewed  2. htn - low salt diet advised, pt once to hold off on changing bp meds at this time per recds. Goal <130/80 - cont hctz 25qd, advised him that next time if remains elevated, should recd rx changes and he is ok w/ this  3. Gall bladder inflammation See #1 - low fat diet advised for now - probiotics recd  4. Depression, major, single episode, complete remission ([place]) Denies si/hi/avh, suspect due to his chronic pains. - trial lexapro 20qhs - Jasmine sw to f/u w/ Pt via telephone as well.  5. Tobacco abuse Total cessation  recd.   Patient have been counseled extensively about nutrition and exercise  Return in about 2 months (around 01/04/2017), or if symptoms worsen or fail to improve, for htn.  The patient was given clear instructions to go to ER or return to medical center if symptoms don't improve, worsen or new problems develop. The patient verbalized understanding. The patient was told to call to get lab results if they haven't heard anything in the next week.   This note has been created with Surveyor, quantity. Any transcriptional errors are unintentional.   Maren Reamer, MD, [facility] [place], [place]   11/06/2016, 1:12 PM

## 2016-11-13 ENCOUNTER — Ambulatory Visit: Payer: Self-pay

## 2016-12-04 ENCOUNTER — Ambulatory Visit: Payer: Self-pay | Attending: Internal Medicine | Admitting: Internal Medicine

## 2016-12-04 ENCOUNTER — Encounter: Payer: Self-pay | Admitting: Internal Medicine

## 2016-12-04 VITALS — BP 127/79 | HR 79 | Temp 98.2°F | Resp 16 | Wt 338.6 lb

## 2016-12-04 DIAGNOSIS — G5600 Carpal tunnel syndrome, unspecified upper limb: Secondary | ICD-10-CM | POA: Insufficient documentation

## 2016-12-04 DIAGNOSIS — G4700 Insomnia, unspecified: Secondary | ICD-10-CM | POA: Insufficient documentation

## 2016-12-04 DIAGNOSIS — K219 Gastro-esophageal reflux disease without esophagitis: Secondary | ICD-10-CM | POA: Insufficient documentation

## 2016-12-04 DIAGNOSIS — F329 Major depressive disorder, single episode, unspecified: Secondary | ICD-10-CM | POA: Insufficient documentation

## 2016-12-04 DIAGNOSIS — I10 Essential (primary) hypertension: Secondary | ICD-10-CM | POA: Insufficient documentation

## 2016-12-04 DIAGNOSIS — R040 Epistaxis: Secondary | ICD-10-CM | POA: Insufficient documentation

## 2016-12-04 DIAGNOSIS — J0100 Acute maxillary sinusitis, unspecified: Secondary | ICD-10-CM | POA: Insufficient documentation

## 2016-12-04 MED ORDER — AMOXICILLIN-POT CLAVULANATE 875-125 MG PO TABS: 1.0000 | ORAL_TABLET | Freq: Two times a day (BID) | ORAL | 0 refills | Status: DC

## 2016-12-04 MED ORDER — AMITRIPTYLINE HCL 25 MG PO TABS: 25.0000 mg | ORAL_TABLET | Freq: Every evening | ORAL | 3 refills | Status: DC | PRN

## 2016-12-04 MED ORDER — ESCITALOPRAM OXALATE 20 MG PO TABS: 20.0000 mg | ORAL_TABLET | Freq: Every day | ORAL | 3 refills | Status: DC

## 2016-12-04 MED ORDER — FLUTICASONE PROPIONATE 50 MCG/ACT NA SUSP: 2.0000 | Freq: Every day | NASAL | 6 refills | Status: DC

## 2016-12-04 MED FILL — FLUTICASONE PROP 50 MCG SPR: 50 | 30 days supply | Qty: 16 | Fill #0

## 2016-12-04 MED FILL — AMITRIPTYLINE HCL 25 MG TAB: 25 | 30 days supply | Qty: 30 | Fill #0

## 2016-12-04 MED FILL — ESCITALOPRAM 20 MG TABLET: 20 | 30 days supply | Qty: 30 | Fill #0

## 2016-12-04 MED FILL — AMOX-CLAV 875-125 MG TABLET: 875-125 | 10 days supply | Qty: 20 | Fill #0

## 2016-12-04 NOTE — Progress Notes (Signed)
Eric Peck, is a 41 y.o. male  [id number]  [id number]  DOB - [birth year]  Chief Complaint  Patient presents with  . URI        Subjective:   Eric Peck is a 41 y.o. male here today for a follow up visit for sinus congestion x 2 days, w/ fevers/chills, tmax 104 yesterday, nasal congestion, and epistaxis last 2 nights. Dry heat at home. Does not have humidifier.  Still smoking.  Chronic ruq pain tolerable, deals with it now.  Takes probiotics regularly. Insomnia better w/ elavil. lexapro working, denies si/hi/avh currently.   Patient has No headache, No chest pain, No abdominal pain - No Nausea, No new weakness tingling or numbness, No Cough - SOB.  Here w/ grown son.  No problems updated.  ALLERGIES: No Known Allergies  PAST MEDICAL HISTORY: Past Medical History:  Diagnosis Date  . Anxiety   . Arthritis   . CTS (carpal tunnel syndrome)   . Diverticulitis of large intestine with perforation 03/2015   microperf of sigmoid colitis, tx with abx, no surg  . GERD (gastroesophageal reflux disease)   . Hypertension 11/06/2016    MEDICATIONS AT HOME: Prior to Admission medications   Medication Sig Start Date End Date Taking? Authorizing Provider  amitriptyline (ELAVIL) 25 MG tablet Take 1 tablet (25 mg total) by mouth at bedtime as needed for sleep. 12/04/16   Maren Reamer, MD  amoxicillin-clavulanate (AUGMENTIN) 875-125 MG tablet Take 1 tablet by mouth 2 (two) times daily. 12/04/16   Maren Reamer, MD  escitalopram (LEXAPRO) 20 MG tablet Take 1 tablet (20 mg total) by mouth daily. 12/04/16   Maren Reamer, MD  fluticasone (FLONASE) 50 MCG/ACT nasal spray Place 2 sprays into both nostrils daily. 12/04/16   Maren Reamer, MD  gabapentin (NEURONTIN) 300 MG capsule Take 1 capsule (300 mg total) by mouth at bedtime. 11/06/16   Maren Reamer, MD  hydrochlorothiazide (HYDRODIURIL) 25 MG tablet Take 1 tablet (25 mg total) by mouth daily. 11/06/16   Maren Reamer, MD   ibuprofen (ADVIL,MOTRIN) 200 MG tablet Take 400-800 mg by mouth every 6 (six) hours as needed for headache or mild pain. Reported on 04/12/2016    Historical Provider, MD  ondansetron (ZOFRAN) 4 MG tablet Take 1 tablet (4 mg total) by mouth every 6 (six) hours as needed for nausea or vomiting. 10/24/16   Maren Reamer, MD  pantoprazole (PROTONIX) 40 MG tablet Take 1 tablet (40 mg total) by mouth daily. 08/29/16   Maren Reamer, MD  pravastatin (PRAVACHOL) 20 MG tablet Take 1 tablet (20 mg total) by mouth daily. Patient not taking: Reported on 10/30/2016 08/30/16   Maren Reamer, MD     Objective:   Vitals:   12/04/16 1433  BP: 127/79  Pulse: 79  Resp: 16  Temp: 98.2 F (36.8 C)  TempSrc: Oral  SpO2: 93%  Weight: (!) 338 lb 9.6 oz (153.6 kg)    Exam General appearance : Awake, alert, not in any distress. Speech Clear. Not toxic looking, tired appearing. HEENT: Atraumatic and Normocephalic, pupils equally reactive to light.  Left tm w/ fullness behind tm, but no erythema, right tm clear.  Boggy nairs bilat, w/ ttp right max sinus. Neck: supple, no JVD. No enlarged tonsils/exduate noted.  No cerv lad. Chest:Good air entry bilaterally, no added sounds. CVS: S1 S2 regular, no murmurs/gallups or rubs. Abdomen: Bowel sounds active, Non tender and not distended with no gaurding, rigidity  or rebound. Extremities: B/L Lower Ext shows no edema, both legs are warm to touch Neurology: Awake alert, and oriented X 3, CN II-XII grossly intact, Non focal Skin:No Rash  Data Review Lab Results  Component Value Date   HGBA1C 5.4 08/29/2016    Depression screen [facility] 2/9 12/04/2016 11/06/2016 08/29/2016 06/26/2016 05/24/2016  Decreased Interest 2 3 3 3  0  Down, Depressed, Hopeless 2 3 3 3 1   PHQ - 2 Score 4 6 6 6 1   Altered sleeping 3 3 3 3 3   Tired, decreased energy 3 3 3 3 3   Change in appetite 2 3 3 3 3   Feeling bad or failure about yourself  (No Data) 3 3 3 3   Trouble concentrating 3 3  3 3 3   Moving slowly or fidgety/restless (No Data) (No Data) (No Data) 0 1  Suicidal thoughts (No Data) (No Data) (No Data) 3 0  PHQ-9 Score 15 21 21 24 17       Assessment & Plan   1. Acute maxillary sinusitis, recurrence not specified - Given active tobacco and tm 104 at home, even though suspect viral, will emp treat w/ augmentin and flonase for now. - recd humified air to help w/ drying out nasal passages and epistaxis - tob cessation would help  2. Insomnia, unspecified type Sleep hygiene tips discussed Trial elavil 25 qhs prn  3. Depression tol lexapro 20 qhs, denies si/hi/avh     Patient have been counseled extensively about nutrition and exercise  Return in about 3 months (around 03/06/2017), or if symptoms worsen or fail to improve.  The patient was given clear instructions to go to ER or return to medical center if symptoms don't improve, worsen or new problems develop. The patient verbalized understanding. The patient was told to call to get lab results if they haven't heard anything in the next week.   This note has been created with Surveyor, quantity. Any transcriptional errors are unintentional.   Maren Reamer, MD, [place] [facility] [place], [place]   12/04/2016, 2:46 PM

## 2016-12-04 NOTE — Patient Instructions (Addendum)
Sinusitis, Adult Sinusitis is soreness and inflammation of your sinuses. Sinuses are hollow spaces in the bones around your face. They are located:  Around your eyes.  In the middle of your forehead.  Behind your nose.  In your cheekbones. Your sinuses and nasal passages are lined with a stringy fluid (mucus). Mucus normally drains out of your sinuses. When your nasal tissues get inflamed or swollen, the mucus can get trapped or blocked so air cannot flow through your sinuses. This lets bacteria, viruses, and funguses grow, and that leads to infection. Follow these instructions at home: Medicines   Take, use, or apply over-the-counter and prescription medicines only as told by your doctor. These may include nasal sprays.  If you were prescribed an antibiotic medicine, take it as told by your doctor. Do not stop taking the antibiotic even if you start to feel better. Hydrate and Humidify   Drink enough water to keep your pee (urine) clear or pale yellow.  Use a cool mist humidifier to keep the humidity level in your home above 50%.  Breathe in steam for 10-15 minutes, 3-4 times a day or as told by your doctor. You can do this in the bathroom while a hot shower is running.  Try not to spend time in cool or dry air. Rest   Rest as much as possible.  Sleep with your head raised (elevated).  Make sure to get enough sleep each night. General instructions   Put a warm, moist washcloth on your face 3-4 times a day or as told by your doctor. This will help with discomfort.  Wash your hands often with soap and water. If there is no soap and water, use hand sanitizer.  Do not smoke. Avoid being around people who are smoking (secondhand smoke).  Keep all follow-up visits as told by your doctor. This is important. Contact a doctor if:  You have a fever.  Your symptoms get worse.  Your symptoms do not get better within 10 days. Get help right away if:  You have a very bad  headache.  You cannot stop throwing up (vomiting).  You have pain or swelling around your face or eyes.  You have trouble seeing.  You feel confused.  Your neck is stiff.  You have trouble breathing. This information is not intended to replace advice given to you by your health care provider. Make sure you discuss any questions you have with your health care provider. Document Released: 03/06/2008 Document Revised: 05/14/2016 Document Reviewed: 07/14/2015 Elsevier Interactive Patient Education  2017 [employer].  -  Insomnia Insomnia is a sleep disorder that makes it difficult to fall asleep or to stay asleep. Insomnia can cause tiredness (fatigue), low energy, difficulty concentrating, mood swings, and poor performance at work or school. There are three different ways to classify insomnia:  Difficulty falling asleep.  Difficulty staying asleep.  Waking up too early in the morning. Any type of insomnia can be long-term (chronic) or short-term (acute). Both are common. Short-term insomnia usually lasts for three months or less. Chronic insomnia occurs at least three times a week for longer than three months. What are the causes? Insomnia may be caused by another condition, situation, or substance, such as:  Anxiety.  Certain medicines.  Gastroesophageal reflux disease (GERD) or other gastrointestinal conditions.  Asthma or other breathing conditions.  Restless legs syndrome, sleep apnea, or other sleep disorders.  Chronic pain.  Menopause. This may include hot flashes.  Stroke.  Abuse of  alcohol, tobacco, or illegal drugs.  Depression.  Caffeine.  Neurological disorders, such as Alzheimer disease.  An overactive thyroid (hyperthyroidism). The cause of insomnia may not be known. What increases the risk? Risk factors for insomnia include:  Gender. Women are more commonly affected than men.  Age. Insomnia is more common as you get older.  Stress. This  may involve your professional or personal life.  Income. Insomnia is more common in people with lower income.  Lack of exercise.  Irregular work schedule or night shifts.  Traveling between different time zones. What are the signs or symptoms? If you have insomnia, trouble falling asleep or trouble staying asleep is the main symptom. This may lead to other symptoms, such as:  Feeling fatigued.  Feeling nervous about going to sleep.  Not feeling rested in the morning.  Having trouble concentrating.  Feeling irritable, anxious, or depressed. How is this treated? Treatment for insomnia depends on the cause. If your insomnia is caused by an underlying condition, treatment will focus on addressing the condition. Treatment may also include:  Medicines to help you sleep.  Counseling or therapy.  Lifestyle adjustments. Follow these instructions at home:  Take medicines only as directed by your health care provider.  Keep regular sleeping and waking hours. Avoid naps.  Keep a sleep diary to help you and your health care provider figure out what could be causing your insomnia. Include:  When you sleep.  When you wake up during the night.  How well you sleep.  How rested you feel the next day.  Any side effects of medicines you are taking.  What you eat and drink.  Make your bedroom a comfortable place where it is easy to fall asleep:  Put up shades or special blackout curtains to block light from outside.  Use a white noise machine to block noise.  Keep the temperature cool.  Exercise regularly as directed by your health care provider. Avoid exercising right before bedtime.  Use relaxation techniques to manage stress. Ask your health care provider to suggest some techniques that may work well for you. These may include:  Breathing exercises.  Routines to release muscle tension.  Visualizing peaceful scenes.  Cut back on alcohol, caffeinated beverages, and  cigarettes, especially close to bedtime. These can disrupt your sleep.  Do not overeat or eat spicy foods right before bedtime. This can lead to digestive discomfort that can make it hard for you to sleep.  Limit screen use before bedtime. This includes:  Watching TV.  Using your smartphone, tablet, and computer.  Stick to a routine. This can help you fall asleep faster. Try to do a quiet activity, brush your teeth, and go to bed at the same time each night.  Get out of bed if you are still awake after 15 minutes of trying to sleep. Keep the lights down, but try reading or doing a quiet activity. When you feel sleepy, go back to bed.  Make sure that you drive carefully. Avoid driving if you feel very sleepy.  Keep all follow-up appointments as directed by your health care provider. This is important. Contact a health care provider if:  You are tired throughout the day or have trouble in your daily routine due to sleepiness.  You continue to have sleep problems or your sleep problems get worse. Get help right away if:  You have serious thoughts about hurting yourself or someone else. This information is not intended to replace advice given to you  by your health care provider. Make sure you discuss any questions you have with your health care provider. Document Released: 09/15/2000 Document Revised: 02/18/2016 Document Reviewed: 06/19/2014 Elsevier Interactive Patient Education  2017 [employer].

## 2017-02-15 ENCOUNTER — Encounter: Payer: Self-pay | Admitting: Internal Medicine

## 2018-01-22 ENCOUNTER — Ambulatory Visit (HOSPITAL_COMMUNITY)
Admission: EM | Admit: 2018-01-22 | Discharge: 2018-01-22 | Disposition: A | Discharge status: Home / Self Care | Payer: Self-pay | Attending: Family Medicine | Admitting: Family Medicine

## 2018-01-22 ENCOUNTER — Encounter (HOSPITAL_COMMUNITY): Payer: Self-pay | Admitting: Emergency Medicine

## 2018-01-22 DIAGNOSIS — R1909 Other intra-abdominal and pelvic swelling, mass and lump: Secondary | ICD-10-CM

## 2018-01-22 DIAGNOSIS — S39012A Strain of muscle, fascia and tendon of lower back, initial encounter: Secondary | ICD-10-CM

## 2018-01-22 MED ORDER — DICLOFENAC SODIUM 75 MG PO TBEC: 75.0000 mg | DELAYED_RELEASE_TABLET | Freq: Two times a day (BID) | ORAL | 0 refills | Status: DC

## 2018-01-22 MED ORDER — HYDROCODONE-ACETAMINOPHEN 5-325 MG PO TABS: 1.0000 | ORAL_TABLET | Freq: Four times a day (QID) | ORAL | 0 refills | Status: DC | PRN

## 2018-01-22 NOTE — Discharge Instructions (Signed)

## 2018-01-22 NOTE — ED Triage Notes (Signed)
States was bending over to pick up an object from ground, low back pain occurred and was having trouble to get up. C.o "gland": in groin area, and pt states "felt a pop"

## 2018-01-22 NOTE — ED Provider Notes (Signed)
[place]   [id number] 01/22/18 Arrival Time: 9518  ASSESSMENT & PLAN:  1. Strain of lumbar region, initial encounter   2. Mass of right inguinal region     Meds ordered this encounter  Medications  . HYDROcodone-acetaminophen (NORCO/VICODIN) 5-325 MG tablet    Sig: Take 1 tablet by mouth every 6 (six) hours as needed for moderate pain or severe pain.    Dispense:  8 tablet    Refill:  0  . diclofenac (VOLTAREN) 75 MG EC tablet    Sig: Take 1 tablet (75 mg total) by mouth 2 (two) times daily.    Dispense:  14 tablet    Refill:  0   Medication sedation precautions. Encouraged ROM as tolerated. Will f/u with PCP or here if back pain not improving over the next week.  Also instructed to schedule f/u with PCP to evaluate R inguinal mass. Question hernia.  Reviewed expectations re: course of current medical issues. Questions answered. Outlined signs and symptoms indicating need for more acute intervention. Patient verbalized understanding. After Visit Summary given.   SUBJECTIVE: History from: patient.  LADELL LEA is a 42 y.o. male who presents with complaint of intermittent left sided lower back discomfort. Onset abrupt beginning yesterday. Injury/trama: yes, reports abrupt pain while lifting heavy object from floor. History of back problems: no. Previous back surgery: no. Discomfort described as aching and sharp without radiation. Certain movements exacerbate the described discomfort. Better with rest. Extremity sensation changes or weakness: none. Ambulatory without difficulty. Normal bowel/bladder habits. No associated abdominal pain/n/v. Self treatment: tried OTCs without relief of pain.  Patient reports no fevers, IV drug use, recent back surgeries or procedures, urinary incontinence, or bowel incontinence.  Also reports a 'lump' of R inguinal area. Present for many years. Noticed bulging this past week that has now gone back to baseline. Very tender  now.  ROS: As per HPI.   OBJECTIVE:  Vitals:   01/22/18 1333  BP: 117/74  Pulse: 81  Temp: 98.7 F (37.1 C)  TempSrc: Oral  SpO2: 97%    General appearance: alert; no distress Abdomen: soft, non-tender Inguinal: R with approx 1.5cm mass present; very tender to touch; no overlying skin changes; feels fairly firm Back: left sided lower tenderness present over SI joint area; FROM at hips with mild discomfort reported; bruising: none; without midline tenderness Extremities: no cyanosis or edema; symmetrical with no gross deformities Skin: warm and dry Neurologic: normal gait; normal symmetric reflexes; normal LE strength and sensation Psychological: alert and cooperative; normal mood and affect  No Known Allergies  Past Medical History:  Diagnosis Date  . Anxiety   . Arthritis   . CTS (carpal tunnel syndrome)   . Diverticulitis of large intestine with perforation 03/2015   microperf of sigmoid colitis, tx with abx, no surg  . GERD (gastroesophageal reflux disease)   . Hypertension 11/06/2016   Social History   Socioeconomic History  . Marital status: Married    Spouse name: Not on file  . Number of children: Not on file  . Years of education: Not on file  . Highest education level: Not on file  Occupational History  . Not on file  Social Needs  . Financial resource strain: Not on file  . Food insecurity:    Worry: Not on file    Inability: Not on file  . Transportation needs:    Medical: Not on file    Non-medical: Not on file  Tobacco Use  .  Smoking status: Current Every Day Smoker    Packs/day: 0.50    Types: Cigarettes  . Smokeless tobacco: Never Used  . Tobacco comment: form given 08-02-16  Substance and Sexual Activity  . Alcohol use: No  . Drug use: Yes    Frequency: 5.0 times per week    Types: Marijuana    Comment: or more  . Sexual activity: Not on file  Lifestyle  . Physical activity:    Days per week: Not on file    Minutes per session: Not on  file  . Stress: Not on file  Relationships  . Social connections:    Talks on phone: Not on file    Gets together: Not on file    Attends religious service: Not on file    Active member of club or organization: Not on file    Attends meetings of clubs or organizations: Not on file    Relationship status: Not on file  . Intimate partner violence:    Fear of current or ex partner: Not on file    Emotionally abused: Not on file    Physically abused: Not on file    Forced sexual activity: Not on file  Other Topics Concern  . Not on file  Social History Narrative   Married 1 son a1 daughter   Unemployed carpenter   Smoker   4 caffeine/day   Frequent marijuana   08/02/2016   Family History  Problem Relation Age of Onset  . COPD Mother   . Drug abuse Mother        EtOH  . Inflammatory bowel disease Mother   . Cystic fibrosis Mother   . Irritable bowel syndrome Mother   . Diabetes Father   . Kidney disease Father   . Eczema Brother   . Irritable bowel syndrome Brother   . Mental retardation Brother        Down's Syndrome  . Breast cancer Maternal Grandmother   . Colitis Maternal Grandmother   . Breast cancer Paternal Grandmother   . Prostate cancer Paternal Uncle        x 2  . Crohn's disease Cousin        x 2   Past Surgical History:  Procedure Laterality Date  . COLONOSCOPY N/A 04/23/2015   Procedure: DIAGNOSTIC COLONOSCOPY;  Surgeon: [name], MD;  Location: [facility];  Service: Endoscopy;  Laterality: N/A;  . Lady Deutscher, MD 01/22/18 1537

## 2018-04-29 ENCOUNTER — Encounter (HOSPITAL_COMMUNITY): Payer: Self-pay | Admitting: *Deleted

## 2018-04-29 ENCOUNTER — Other Ambulatory Visit: Payer: Self-pay

## 2018-04-29 ENCOUNTER — Emergency Department (HOSPITAL_COMMUNITY)
Admission: EM | Admit: 2018-04-29 | Discharge: 2018-04-29 | Disposition: A | Discharge status: Home / Self Care | Payer: Self-pay | Attending: Emergency Medicine | Admitting: Emergency Medicine

## 2018-04-29 DIAGNOSIS — W208XXA Other cause of strike by thrown, projected or falling object, initial encounter: Secondary | ICD-10-CM | POA: Insufficient documentation

## 2018-04-29 DIAGNOSIS — S0501XA Injury of conjunctiva and corneal abrasion without foreign body, right eye, initial encounter: Secondary | ICD-10-CM

## 2018-04-29 DIAGNOSIS — Y92018 Other place in single-family (private) house as the place of occurrence of the external cause: Secondary | ICD-10-CM | POA: Insufficient documentation

## 2018-04-29 DIAGNOSIS — I10 Essential (primary) hypertension: Secondary | ICD-10-CM | POA: Insufficient documentation

## 2018-04-29 DIAGNOSIS — F17210 Nicotine dependence, cigarettes, uncomplicated: Secondary | ICD-10-CM | POA: Insufficient documentation

## 2018-04-29 DIAGNOSIS — Y93E9 Activity, other interior property and clothing maintenance: Secondary | ICD-10-CM | POA: Insufficient documentation

## 2018-04-29 DIAGNOSIS — Z79899 Other long term (current) drug therapy: Secondary | ICD-10-CM | POA: Insufficient documentation

## 2018-04-29 DIAGNOSIS — Y999 Unspecified external cause status: Secondary | ICD-10-CM | POA: Insufficient documentation

## 2018-04-29 MED ORDER — OXYCODONE-ACETAMINOPHEN 5-325 MG PO TABS
1.0000 | ORAL_TABLET | Freq: Once | ORAL | Status: AC
  Administered 2018-04-29: 1 via ORAL
  Filled 2018-04-29: qty 1

## 2018-04-29 MED ORDER — ERYTHROMYCIN 5 MG/GM OP OINT
1.0000 "application " | TOPICAL_OINTMENT | Freq: Four times a day (QID) | OPHTHALMIC | Status: DC
  Administered 2018-04-29: 1 via OPHTHALMIC
  Filled 2018-04-29: qty 3.5

## 2018-04-29 MED ORDER — OXYCODONE-ACETAMINOPHEN 5-325 MG PO TABS: 1.0000 | ORAL_TABLET | Freq: Four times a day (QID) | ORAL | 0 refills | Status: DC | PRN

## 2018-04-29 MED ORDER — FLUORESCEIN SODIUM 1 MG OP STRP
1.0000 | ORAL_STRIP | Freq: Once | OPHTHALMIC | Status: AC
  Administered 2018-04-29: 1 via OPHTHALMIC
  Filled 2018-04-29: qty 1

## 2018-04-29 MED ORDER — TETRACAINE HCL 0.5 % OP SOLN
2.0000 [drp] | Freq: Once | OPHTHALMIC | Status: AC
  Administered 2018-04-29: 2 [drp] via OPHTHALMIC
  Filled 2018-04-29: qty 4

## 2018-04-29 NOTE — ED Provider Notes (Signed)
[place] EMERGENCY DEPARTMENT Provider Note   CSN: [id number] Arrival date & time: 04/29/18  1431   History   Chief Complaint Chief Complaint  Patient presents with  . Eye Injury    HPI Eric Peck is a 42 y.o. male.  HPI   58 YOM presents today with complaints of right eye pain. Eric Peck reports that he was pulling insulation out from an attic when debris within to his right eye. He notes immediate pain, foreign body sensation. Patient notes that the light hurts his eyes, and has difficulty keeping his eye open. He denies any trauma. He does not wear contact lenses.  Past Medical History:  Diagnosis Date  . Anxiety   . Arthritis   . CTS (carpal tunnel syndrome)   . Diverticulitis of large intestine with perforation 03/2015   microperf of sigmoid colitis, tx with abx, no surg  . GERD (gastroesophageal reflux disease)   . Hypertension 11/06/2016    Patient Active Problem List   Diagnosis Date Noted  . Hypertension 11/06/2016  . Tobacco abuse 05/24/2016  . Diverticulosis of colon without hemorrhage 05/24/2016  . Constipation 05/24/2016  . Chronic RUQ pain 04/12/2016    Past Surgical History:  Procedure Laterality Date  . COLONOSCOPY N/A 04/23/2015   Procedure: DIAGNOSTIC COLONOSCOPY;  Surgeon: [name], MD;  Location: [facility];  Service: Endoscopy;  Laterality: N/A;  . VASECTOMY          Home Medications    Prior to Admission medications   Medication Sig Start Date End Date Taking? Authorizing Provider  amitriptyline (ELAVIL) 25 MG tablet Take 1 tablet (25 mg total) by mouth at bedtime as needed for sleep. 12/04/16   Maren Reamer, MD  diclofenac (VOLTAREN) 75 MG EC tablet Take 1 tablet (75 mg total) by mouth 2 (two) times daily. 01/22/18   Vanessa Kick, MD  escitalopram (LEXAPRO) 20 MG tablet Take 1 tablet (20 mg total) by mouth daily. 12/04/16   Langeland, Leda Quail, MD  fluticasone (FLONASE) 50 MCG/ACT nasal spray Place 2 sprays  into both nostrils daily. 12/04/16   Maren Reamer, MD  gabapentin (NEURONTIN) 300 MG capsule Take 1 capsule (300 mg total) by mouth at bedtime. 11/06/16   Maren Reamer, MD  hydrochlorothiazide (HYDRODIURIL) 25 MG tablet Take 1 tablet (25 mg total) by mouth daily. 11/06/16   Maren Reamer, MD  HYDROcodone-acetaminophen (NORCO/VICODIN) 5-325 MG tablet Take 1 tablet by mouth every 6 (six) hours as needed for moderate pain or severe pain. 01/22/18   Vanessa Kick, MD  ibuprofen (ADVIL,MOTRIN) 200 MG tablet Take 400-800 mg by mouth every 6 (six) hours as needed for headache or mild pain. Reported on 04/12/2016    [provider]  ondansetron (ZOFRAN) 4 MG tablet Take 1 tablet (4 mg total) by mouth every 6 (six) hours as needed for nausea or vomiting. 10/24/16   Maren Reamer, MD  oxyCODONE-acetaminophen (PERCOCET/ROXICET) 5-325 MG tablet Take 1 tablet by mouth every 6 (six) hours as needed for severe pain. 04/29/18   Shanine Kreiger, Dellis Filbert, PA-C  pantoprazole (PROTONIX) 40 MG tablet Take 1 tablet (40 mg total) by mouth daily. 08/29/16   Maren Reamer, MD  pravastatin (PRAVACHOL) 20 MG tablet Take 1 tablet (20 mg total) by mouth daily. Patient not taking: Reported on 10/30/2016 08/30/16   Maren Reamer, MD    Family History Family History  Problem Relation Age of Onset  . COPD Mother   .  Drug abuse Mother        EtOH  . Inflammatory bowel disease Mother   . Cystic fibrosis Mother   . Irritable bowel syndrome Mother   . Diabetes Father   . Kidney disease Father   . Eczema Brother   . Irritable bowel syndrome Brother   . Mental retardation Brother        Down's Syndrome  . Breast cancer Maternal Grandmother   . Colitis Maternal Grandmother   . Breast cancer Paternal Grandmother   . Prostate cancer Paternal Uncle        x 2  . Crohn's disease Cousin        x 2    Social History Social History   Tobacco Use  . Smoking status: Current Every Day Smoker    Packs/day: 0.50     Types: Cigarettes  . Smokeless tobacco: Never Used  . Tobacco comment: form given 08-02-16  Substance Use Topics  . Alcohol use: No  . Drug use: Yes    Frequency: 5.0 times per week    Types: Marijuana    Comment: or more     Allergies   Patient has no known allergies.   Review of Systems Review of Systems  All other systems reviewed and are negative.    Physical Exam Updated Vital Signs BP (!) 143/94 (BP Location: Right Arm)   Pulse (!) 57   Temp 98 F (36.7 C) (Oral)   Resp 16   SpO2 97%   Physical Exam  Constitutional: He is oriented to person, place, and time. He appears well-developed and well-nourished.  HENT:  Head: Normocephalic and atraumatic.  Vision intact bilateral decreased on the right- extraocular movements intact and pain-free- corneal abrasions noted  Eyes: Pupils are equal, round, and reactive to light. Conjunctivae are normal. Right eye exhibits no discharge. Left eye exhibits no discharge. No scleral icterus.    Neck: Normal range of motion. No JVD present. No tracheal deviation present.  Pulmonary/Chest: Effort normal. No stridor.  Neurological: He is alert and oriented to person, place, and time. Coordination normal.  Psychiatric: He has a normal mood and affect. His behavior is normal. Judgment and thought content normal.  Nursing note and vitals reviewed.    ED Treatments / Results  Labs (all labs ordered are listed, but only abnormal results are displayed) Labs Reviewed - No data to display  EKG None  Radiology No results found.  Procedures Procedures (including critical care time)  Medications Ordered in ED Medications  erythromycin ophthalmic ointment 1 application (1 application Right Eye Given 04/29/18 1738)  tetracaine (PONTOCAINE) 0.5 % ophthalmic solution 2 drop (2 drops Right Eye Given 04/29/18 1738)  fluorescein ophthalmic strip 1 strip (1 strip Right Eye Given 04/29/18 1738)  oxyCODONE-acetaminophen  (PERCOCET/ROXICET) 5-325 MG per tablet 1 tablet (1 tablet Oral Given 04/29/18 1804)     Initial Impression / Assessment and Plan / ED Course  I have reviewed the triage vital signs and the nursing notes.  Pertinent labs & imaging results that were available during my care of the patient were reviewed by me and considered in my medical decision making (see chart for details).     Labs:   Imaging:  Consults:  Therapeutics:  Discharge Meds: erythromycin  Assessment/Plan: 42 year old male presents to corneal abrasions. Patient has 2 corneal abrasions on the right eye. Symptoms improved tetracaine here. He'll be discharged with Percocet, erythromycin, and follow-up with ophthalmology if symptoms do not improve in 48 hours.  He is given strict return precautions, he verbalized understanding and agreement to today's plan had no further questions or concerns at the time discharge.    Final Clinical Impressions(s) / ED Diagnoses   Final diagnoses:  Abrasion of right cornea, initial encounter    ED Discharge Orders        Ordered    oxyCODONE-acetaminophen (PERCOCET/ROXICET) 5-325 MG tablet  Every 6 hours PRN     04/29/18 1752       Okey Regal, PA-C 04/29/18 1806    Maudie Flakes, MD 04/29/18 [phone]

## 2018-04-29 NOTE — ED Notes (Signed)
Patient verbalizes understanding of discharge instructions. Opportunity for questioning and answers were provided. Armband removed by staff, pt discharged from ED ambulatory.   

## 2018-04-29 NOTE — ED Provider Notes (Signed)
Patient placed in Quick Look pathway, seen and evaluated   Chief Complaint: Right eye injury  HPI:   Patient reports just prior to arrival he was pulling wood off of wall and a piece hit him in his right eye complaining of pain he is been holding a towel to the eye has not noticed any drainage or bleeding, difficulty opening the eye due to irritation.  Unsure vision changes.  No swelling noted.  No issues with the left eye  ROS: + Eye pain, eye redness  Physical Exam:   Gen: No distress  Neuro: Awake and Alert  Skin: Warm    Focused Exam: Difficulty opening the right eye, on exam it appears red and irritated, but the eye appears intact, no surrounding swelling, extraocular movements intact.   Initiation of care has begun. The patient has been counseled on the process, plan, and necessity for staying for the completion/evaluation, and the remainder of the medical screening examination    Janet Berlin 04/29/18 1458    Duffy Bruce, MD 04/29/18 [phone]

## 2018-04-29 NOTE — ED Triage Notes (Signed)
Pt in stating he was pulling wood off a wall and a piece fell and hit him in his right eye, c/o pain, unsure of vision changes, pt unable to open eye in triage, no swelling noted

## 2018-04-29 NOTE — Discharge Instructions (Addendum)
Please read the attached information. Please use antibiotics for times daily, once her symptoms resolve use for an additional 24 hours. If symptoms have not improved in 48 hours please contact ophthalmologist and schedule follow-up evaluation. Use medication only as directed and return immediately with any new or worsening signs or symptoms.

## 2019-08-09 ENCOUNTER — Other Ambulatory Visit: Payer: Self-pay

## 2019-08-09 ENCOUNTER — Inpatient Hospital Stay (HOSPITAL_COMMUNITY)
Admission: EM | Admit: 2019-08-09 | Discharge: 2019-08-13 | DRG: 392 | Disposition: A | Discharge status: Home / Self Care | Payer: Self-pay | Attending: Student | Admitting: Student

## 2019-08-09 ENCOUNTER — Emergency Department (HOSPITAL_COMMUNITY): Payer: Self-pay

## 2019-08-09 ENCOUNTER — Encounter (HOSPITAL_COMMUNITY): Payer: Self-pay | Admitting: Emergency Medicine

## 2019-08-09 DIAGNOSIS — E876 Hypokalemia: Secondary | ICD-10-CM | POA: Diagnosis not present

## 2019-08-09 DIAGNOSIS — Z20828 Contact with and (suspected) exposure to other viral communicable diseases: Secondary | ICD-10-CM | POA: Diagnosis present

## 2019-08-09 DIAGNOSIS — I10 Essential (primary) hypertension: Secondary | ICD-10-CM | POA: Diagnosis present

## 2019-08-09 DIAGNOSIS — K5780 Diverticulitis of intestine, part unspecified, with perforation and abscess without bleeding: Secondary | ICD-10-CM

## 2019-08-09 DIAGNOSIS — K5732 Diverticulitis of large intestine without perforation or abscess without bleeding: Secondary | ICD-10-CM

## 2019-08-09 DIAGNOSIS — Z825 Family history of asthma and other chronic lower respiratory diseases: Secondary | ICD-10-CM

## 2019-08-09 DIAGNOSIS — Z833 Family history of diabetes mellitus: Secondary | ICD-10-CM

## 2019-08-09 DIAGNOSIS — K5720 Diverticulitis of large intestine with perforation and abscess without bleeding: Principal | ICD-10-CM | POA: Diagnosis present

## 2019-08-09 DIAGNOSIS — Z8349 Family history of other endocrine, nutritional and metabolic diseases: Secondary | ICD-10-CM

## 2019-08-09 DIAGNOSIS — D72829 Elevated white blood cell count, unspecified: Secondary | ICD-10-CM

## 2019-08-09 DIAGNOSIS — F17210 Nicotine dependence, cigarettes, uncomplicated: Secondary | ICD-10-CM | POA: Diagnosis present

## 2019-08-09 DIAGNOSIS — K529 Noninfective gastroenteritis and colitis, unspecified: Secondary | ICD-10-CM

## 2019-08-09 DIAGNOSIS — Z841 Family history of disorders of kidney and ureter: Secondary | ICD-10-CM

## 2019-08-09 DIAGNOSIS — Z720 Tobacco use: Secondary | ICD-10-CM | POA: Diagnosis present

## 2019-08-09 DIAGNOSIS — F419 Anxiety disorder, unspecified: Secondary | ICD-10-CM | POA: Diagnosis present

## 2019-08-09 DIAGNOSIS — K219 Gastro-esophageal reflux disease without esophagitis: Secondary | ICD-10-CM | POA: Diagnosis present

## 2019-08-09 DIAGNOSIS — F329 Major depressive disorder, single episode, unspecified: Secondary | ICD-10-CM | POA: Diagnosis present

## 2019-08-09 LAB — URINALYSIS, ROUTINE W REFLEX MICROSCOPIC
Bilirubin Urine: NEGATIVE
Glucose, UA: NEGATIVE mg/dL
Hgb urine dipstick: NEGATIVE
Ketones, ur: NEGATIVE mg/dL
Leukocytes,Ua: NEGATIVE
Nitrite: NEGATIVE
Protein, ur: NEGATIVE mg/dL
Specific Gravity, Urine: >1.03 — ABNORMAL HIGH (ref 1.005–1.030)
pH: 6.5 (ref 5.0–8.0)

## 2019-08-09 LAB — LACTIC ACID, PLASMA
Lactic Acid, Venous: 1.5 mmol/L (ref 0.5–1.9)
Lactic Acid, Venous: 1.6 mmol/L (ref 0.5–1.9)

## 2019-08-09 LAB — HIV ANTIBODY (ROUTINE TESTING W REFLEX): HIV Screen 4th Generation wRfx: NONREACTIVE

## 2019-08-09 LAB — CBC
HCT: 51.3 % (ref 39.0–52.0)
Hemoglobin: 16.9 g/dL (ref 13.0–17.0)
MCH: 29.5 pg (ref 26.0–34.0)
MCHC: 32.9 g/dL (ref 30.0–36.0)
MCV: 89.5 fL (ref 80.0–100.0)
Platelets: 251 10*3/uL (ref 150–400)
RBC: 5.73 MIL/uL (ref 4.22–5.81)
RDW: 12.8 % (ref 11.5–15.5)
WBC: 17.9 10*3/uL — ABNORMAL HIGH (ref 4.0–10.5)
nRBC: 0 % (ref 0.0–0.2)

## 2019-08-09 LAB — COMPREHENSIVE METABOLIC PANEL
ALT: 23 U/L (ref 0–44)
AST: 21 U/L (ref 15–41)
Albumin: 4.4 g/dL (ref 3.5–5.0)
Alkaline Phosphatase: 59 U/L (ref 38–126)
Anion gap: 10 (ref 5–15)
BUN: 17 mg/dL (ref 6–20)
CO2: 25 mmol/L (ref 22–32)
Calcium: 9.2 mg/dL (ref 8.9–10.3)
Chloride: 104 mmol/L (ref 98–111)
Creatinine, Ser: 1.06 mg/dL (ref 0.61–1.24)
GFR calc Af Amer: >60 mL/min (ref >60)
GFR calc non Af Amer: >60 mL/min (ref >60)
Glucose, Bld: 122 mg/dL — ABNORMAL HIGH (ref 70–99)
Potassium: 3.9 mmol/L (ref 3.5–5.1)
Sodium: 139 mmol/L (ref 135–145)
Total Bilirubin: 0.6 mg/dL (ref 0.3–1.2)
Total Protein: 7.7 g/dL (ref 6.5–8.1)

## 2019-08-09 LAB — LIPASE, BLOOD: Lipase: 25 U/L (ref 11–51)

## 2019-08-09 IMAGING — CT CT RENAL STONE PROTOCOL
2 of 4 series · 16 of 46 positions shown, 18 images · non-contrast
Comparison: CT abdomen pelvis [DATE]

CLINICAL DATA: Abdominal pain. History of diverticulitis. Flank
pain.

EXAM:
CT ABDOMEN AND PELVIS WITHOUT CONTRAST
TECHNIQUE: Multidetector CT imaging of the abdomen and pelvis was performed
following the standard protocol without IV contrast.

[Series 2: axial st · axial · 0.87mm/px · z∈[-521,-86]mm · 13 of 101 slices shown, 15 images]
[im 7/101  soft-tissue]
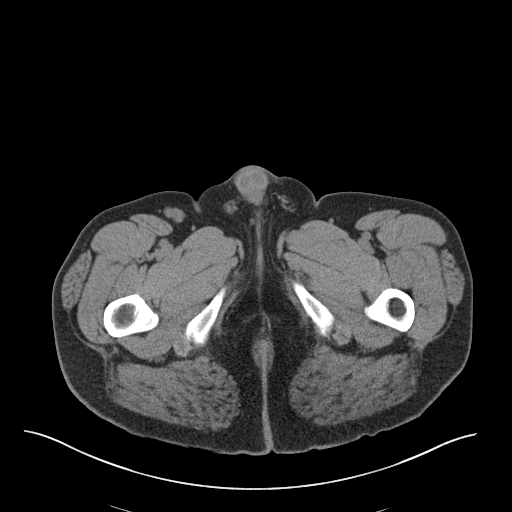
[im 7/101  bone]
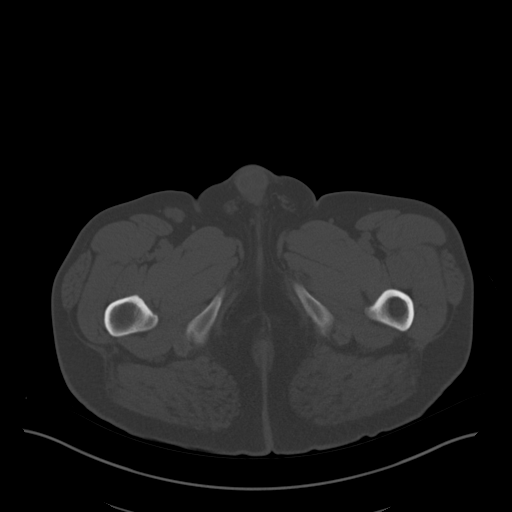
[im 14/101  soft-tissue]
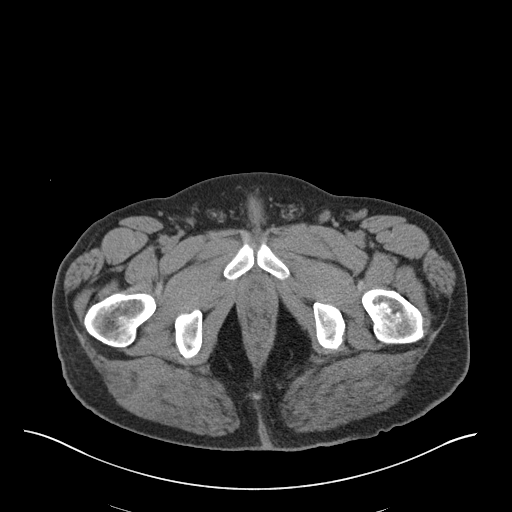
[im 21/101  soft-tissue]
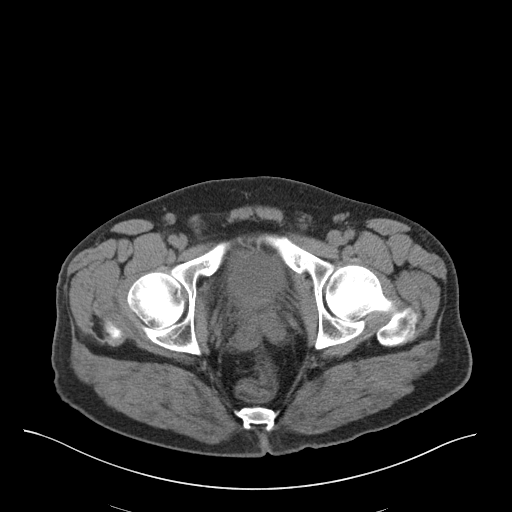
[im 27/101  soft-tissue]
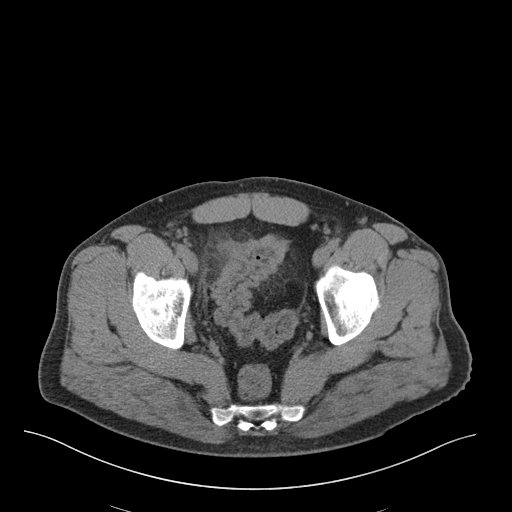
[im 34/101  soft-tissue]
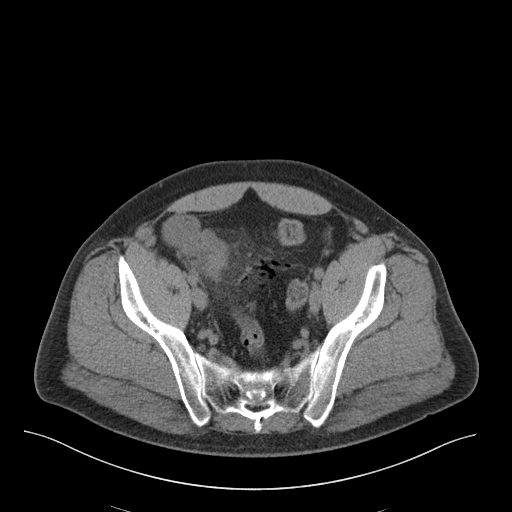
[im 41/101  soft-tissue]
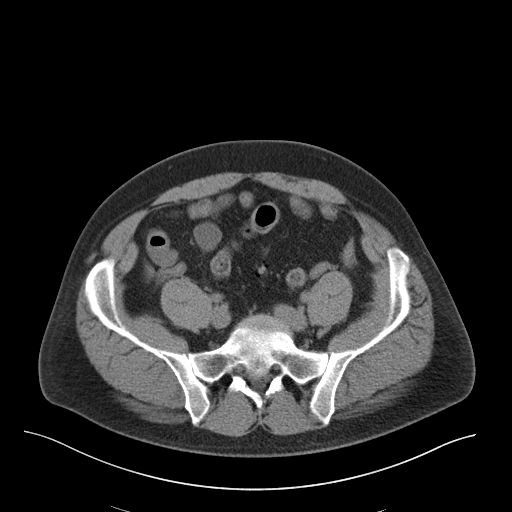
[im 54/101  soft-tissue]
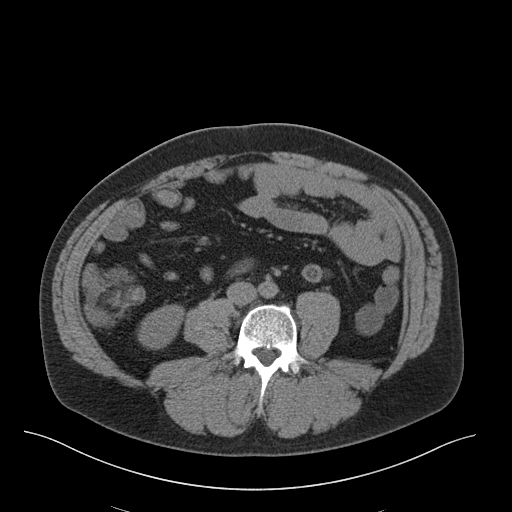
[im 61/101  soft-tissue]
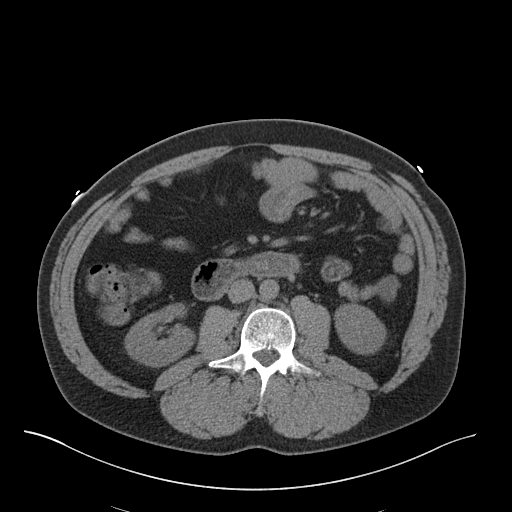
[im 67/101  soft-tissue]
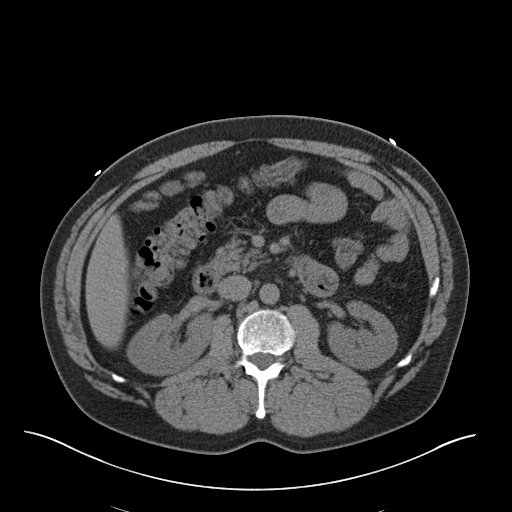
[im 67/101  bone]
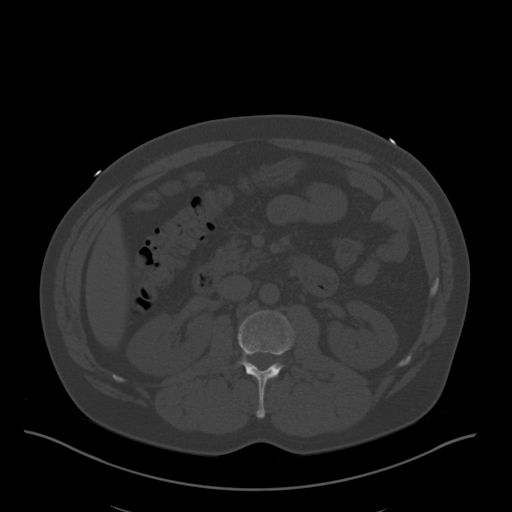
[im 74/101  soft-tissue]
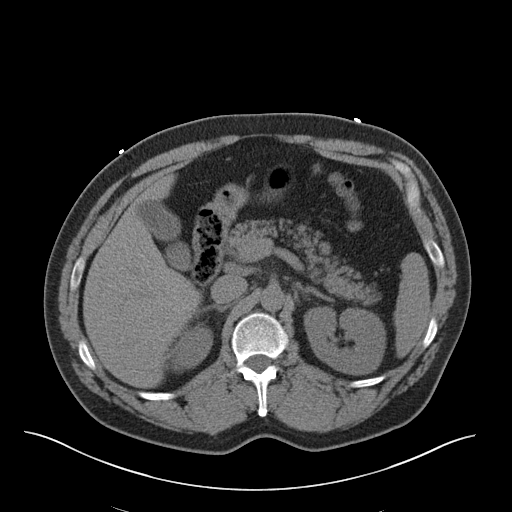
[im 81/101  soft-tissue]
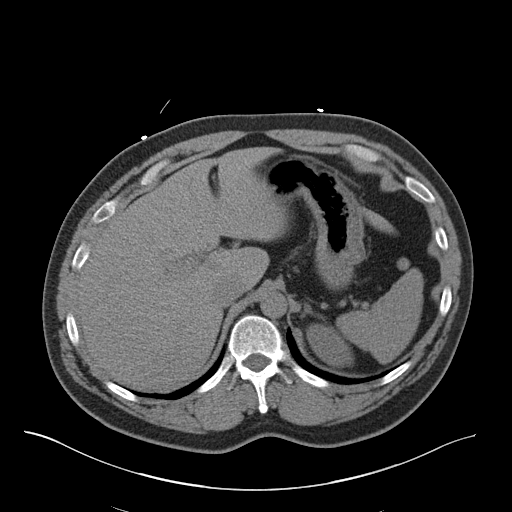
[im 87/101  soft-tissue]
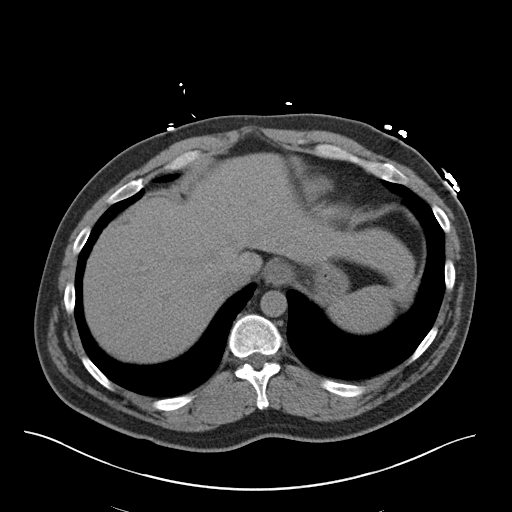
[im 94/101  soft-tissue]
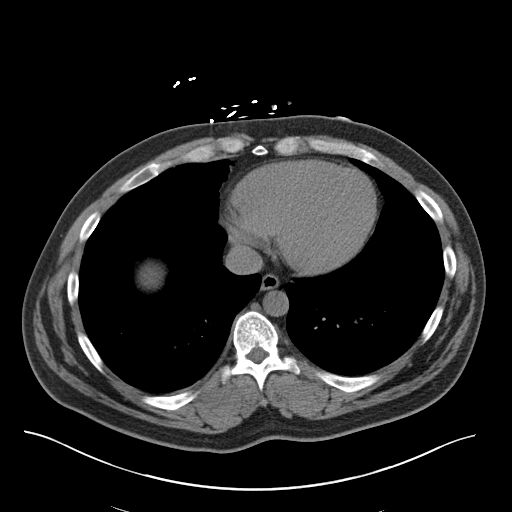

[Series 4: coronal · coronal · 0.88mm/px · 3 of 148 slices shown]
[im 50/148  soft-tissue]
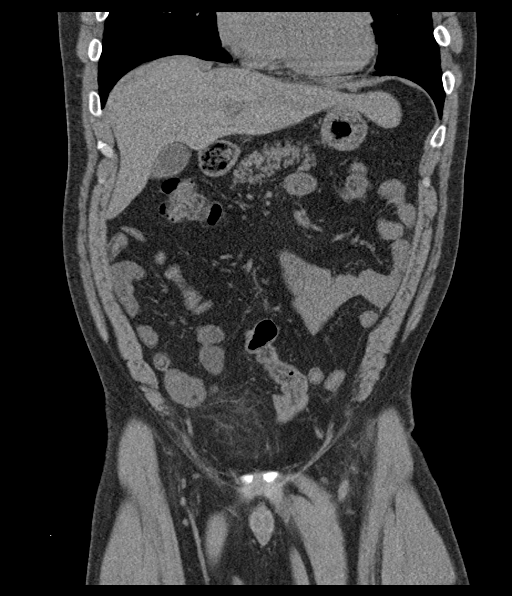
[im 66/148  soft-tissue]
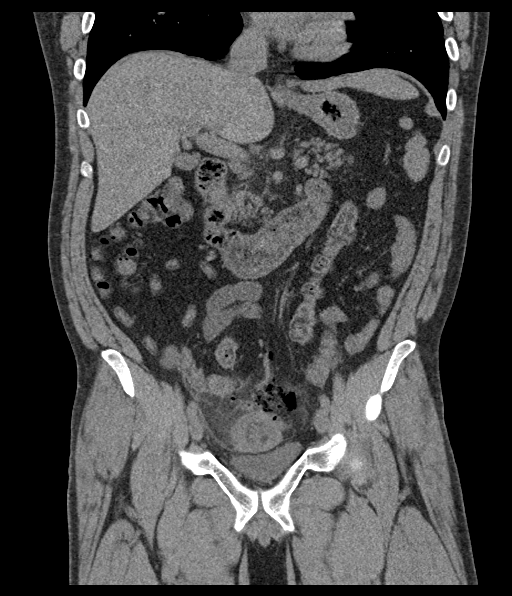
[im 82/148  soft-tissue]
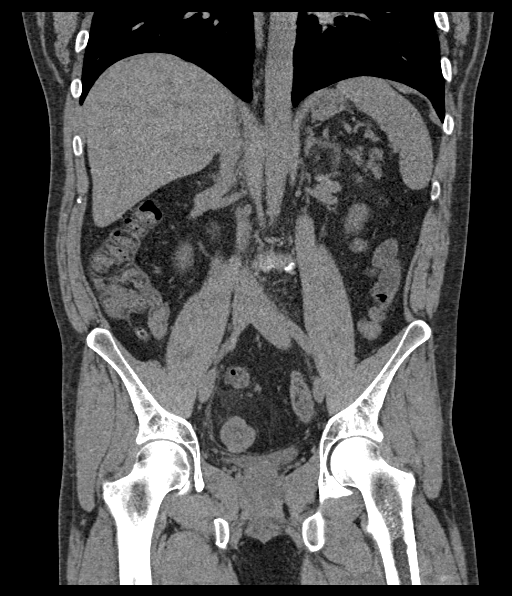

[16 of 46 positions shown; findings below may reference images not displayed]

FINDINGS: Lower chest: Normal heart size. Dependent atelectasis within the
left lower lobe. No pleural effusion.

Hepatobiliary: Liver is normal in size and contour. Gallbladder is
unremarkable.

Pancreas: Unremarkable

Spleen: Unremarkable

Adrenals/Urinary Tract: Normal adrenal glands. Kidneys are symmetric
in size. No hydronephrosis. Urinary bladder is unremarkable.

Stomach/Bowel: Circumferential wall thickening of the sigmoid colon
(image 75; series 2). Focal outpouching of soft tissue along the
margin of the colon (image 74; series 2). There is surrounding
mesenteric fat stranding as well as gas within the mesentery
coursing with in the mid aspect of the abdomen compatible with
perforation. No evidence for upstream bowel obstruction.

Normal morphology of the stomach.

Vascular/Lymphatic: Normal caliber abdominal aorta. No
retroperitoneal lymphadenopathy.

Reproductive: Prostate is unremarkable.

Other: None.

Musculoskeletal: No aggressive or acute appearing osseous lesions.
IMPRESSION: Overall findings are compatible with either focal perforated colitis
or diverticulitis of the sigmoid colon. There is circumferential
wall thickening of the sigmoid colon compatible with colitis or
potentially diverticulitis. There is a soft tissue outpouching along
the margin of the colon at the site of focal perforation. There is
gas within the adjacent colonic mesentery extending cranially within
the abdomen.

Critical Value/emergent results were called by telephone at the time
of interpretation on [DATE] at [DATE] to ADENYO
, who verbally acknowledged these results.

## 2019-08-09 MED ORDER — PIPERACILLIN-TAZOBACTAM 3.375 G IVPB 30 MIN
3.3750 g | Freq: Once | INTRAVENOUS | Status: AC
  Administered 2019-08-09: 3.375 g via INTRAVENOUS
  Filled 2019-08-09: qty 50

## 2019-08-09 MED ORDER — MORPHINE SULFATE (PF) 4 MG/ML IV SOLN
4.0000 mg | Freq: Once | INTRAVENOUS | Status: AC
  Administered 2019-08-09: 4 mg via INTRAVENOUS
  Filled 2019-08-09: qty 1

## 2019-08-09 MED ORDER — ACETAMINOPHEN 650 MG RE SUPP: 650.0000 mg | Freq: Four times a day (QID) | RECTAL | Status: DC | PRN

## 2019-08-09 MED ORDER — KETOROLAC TROMETHAMINE 15 MG/ML IJ SOLN
15.0000 mg | Freq: Four times a day (QID) | INTRAMUSCULAR | Status: DC | PRN
  Administered 2019-08-09 – 2019-08-12 (×6): 15 mg via INTRAVENOUS
  Filled 2019-08-09 (×6): qty 1

## 2019-08-09 MED ORDER — ACETAMINOPHEN 325 MG PO TABS
650.0000 mg | ORAL_TABLET | Freq: Four times a day (QID) | ORAL | Status: DC | PRN
  Administered 2019-08-10: 650 mg via ORAL
  Filled 2019-08-09: qty 2

## 2019-08-09 MED ORDER — HYDROMORPHONE HCL 2 MG/ML IJ SOLN
1.0000 mg | INTRAMUSCULAR | Status: DC | PRN
  Administered 2019-08-09 – 2019-08-10 (×9): 1 mg via INTRAVENOUS
  Filled 2019-08-09 (×9): qty 1

## 2019-08-09 MED ORDER — PANTOPRAZOLE SODIUM 40 MG IV SOLR
40.0000 mg | Freq: Two times a day (BID) | INTRAVENOUS | Status: DC
  Administered 2019-08-09 – 2019-08-11 (×5): 40 mg via INTRAVENOUS
  Filled 2019-08-09 (×5): qty 40

## 2019-08-09 MED ORDER — FENTANYL CITRATE (PF) 100 MCG/2ML IJ SOLN
100.0000 ug | Freq: Once | INTRAMUSCULAR | Status: AC
  Administered 2019-08-09: 100 ug via INTRAVENOUS
  Filled 2019-08-09: qty 2

## 2019-08-09 MED ORDER — SODIUM CHLORIDE 0.9 % IV BOLUS
1000.0000 mL | Freq: Once | INTRAVENOUS | Status: AC
  Administered 2019-08-09: 1000 mL via INTRAVENOUS

## 2019-08-09 MED ORDER — HYDROMORPHONE HCL 1 MG/ML IJ SOLN
1.0000 mg | Freq: Once | INTRAMUSCULAR | Status: AC
  Administered 2019-08-09: 1 mg via INTRAVENOUS
  Filled 2019-08-09: qty 1

## 2019-08-09 MED ORDER — ONDANSETRON HCL 4 MG PO TABS: 4.0000 mg | ORAL_TABLET | Freq: Four times a day (QID) | ORAL | Status: DC | PRN

## 2019-08-09 MED ORDER — ONDANSETRON HCL 4 MG/2ML IJ SOLN
4.0000 mg | Freq: Once | INTRAMUSCULAR | Status: AC
  Administered 2019-08-09: 4 mg via INTRAVENOUS
  Filled 2019-08-09: qty 2

## 2019-08-09 MED ORDER — ENOXAPARIN SODIUM 40 MG/0.4ML ~~LOC~~ SOLN
40.0000 mg | SUBCUTANEOUS | Status: DC
  Administered 2019-08-09 – 2019-08-12 (×4): 40 mg via SUBCUTANEOUS
  Filled 2019-08-09 (×4): qty 0.4

## 2019-08-09 MED ORDER — DEXTROSE-NACL 5-0.9 % IV SOLN
INTRAVENOUS | Status: DC
  Administered 2019-08-09: 17:00:00 via INTRAVENOUS
  Administered 2019-08-10: 1000 mL via INTRAVENOUS

## 2019-08-09 MED ORDER — PIPERACILLIN-TAZOBACTAM 3.375 G IVPB
3.3750 g | Freq: Three times a day (TID) | INTRAVENOUS | Status: DC
  Administered 2019-08-09 – 2019-08-12 (×9): 3.375 g via INTRAVENOUS
  Filled 2019-08-09 (×10): qty 50

## 2019-08-09 MED ORDER — ONDANSETRON HCL 4 MG/2ML IJ SOLN
4.0000 mg | Freq: Four times a day (QID) | INTRAMUSCULAR | Status: DC | PRN
  Administered 2019-08-10: 4 mg via INTRAVENOUS
  Filled 2019-08-09: qty 2

## 2019-08-09 NOTE — ED Provider Notes (Signed)
[facility] DEPT Provider Note   CSN: [id number] Arrival date & time: 08/09/19  1123     History   Chief Complaint Chief Complaint  Patient presents with  . Abdominal Pain    HPI Eric Peck is a 43 y.o. male past medical 3 of diverticulitis with perforation, GERD, hypertension who presents for evaluation of worsening abdominal pain, hematuria, dysuria.  He started noticing some abdominal pain about 3 days ago.  He states it was in the lower abdomen and he had some associated nausea.  He states that the next day, he started having some worsening pain and some blood in his urine and followed up with [facility].  They diagnosed him with a UTI and he was given 2 antibiotics, 1 of which was Cipro and the other when he does not recall the name of.  He states that he had started to feel better until this morning when his pain started worsening.  He feels like his testicles are swollen but they are not painful and he denies any overlying warmth, erythema.  He has not had any penile discharge.  He reports associated nausea but no vomiting.  He states he has had low-grade fever and states it has gone up to 100.1.  Denies any chest pain or difficulty breathing.  His last bowel movement was today and had no blood in stool.     The history is provided by the patient.    Past Medical History:  Diagnosis Date  . Anxiety   . Arthritis   . CTS (carpal tunnel syndrome)   . Diverticulitis of large intestine with perforation 03/2015   microperf of sigmoid colitis, tx with abx, no surg  . GERD (gastroesophageal reflux disease)   . Hypertension 11/06/2016    Patient Active Problem List   Diagnosis Date Noted  . Diverticulitis of intestine with perforation 08/09/2019  . GERD (gastroesophageal reflux disease)   . Leukocytosis   . Hypertension 11/06/2016  . Tobacco abuse 05/24/2016  . Diverticulosis of colon without hemorrhage 05/24/2016  . Constipation  05/24/2016  . Chronic RUQ pain 04/12/2016    Past Surgical History:  Procedure Laterality Date  . COLONOSCOPY N/A 04/23/2015   Procedure: DIAGNOSTIC COLONOSCOPY;  Surgeon: [name], MD;  Location: [facility] END[facility]COPY;  Service: Endoscopy;  Laterality: N/A;  . VASECTOMY          Home Medications    Prior to Admission medications   Medication Sig Start Date End Date Taking? Authorizing Provider  ciprofloxacin (CIPRO) 500 MG tablet Take 500 mg by mouth every 12 (twelve) hours.   Yes [provider]  ibuprofen (ADVIL,MOTRIN) 200 MG tablet Take 400-800 mg by mouth every 6 (six) hours as needed for headache or mild pain. Reported on 04/12/2016   Yes [provider]  metroNIDAZOLE (FLAGYL) 500 MG tablet Take 500 mg by mouth 3 (three) times daily.   Yes [provider]  phenazopyridine (PYRIDIUM) 200 MG tablet Take 200 mg by mouth 3 (three) times daily as needed for pain.   Yes [provider]  promethazine (PHENERGAN) 25 MG tablet Take 25 mg by mouth every 6 (six) hours as needed for nausea or vomiting.   Yes [provider]  amitriptyline (ELAVIL) 25 MG tablet Take 1 tablet (25 mg total) by mouth at bedtime as needed for sleep. Patient not taking: Reported on 08/09/2019 12/04/16   Maren Reamer, MD  diclofenac (VOLTAREN) 75 MG EC tablet Take 1 tablet (  75 mg total) by mouth 2 (two) times daily. Patient not taking: Reported on 08/09/2019 01/22/18   Vanessa Kick, MD  escitalopram (LEXAPRO) 20 MG tablet Take 1 tablet (20 mg total) by mouth daily. Patient not taking: Reported on 08/09/2019 12/04/16   Maren Reamer, MD  fluticasone [facility]) 50 MCG/ACT nasal spray Place 2 sprays into both nostrils daily. Patient not taking: Reported on 08/09/2019 12/04/16   Maren Reamer, MD  gabapentin (NEURONTIN) 300 MG capsule Take 1 capsule (300 mg total) by mouth at bedtime. Patient not taking: Reported on 08/09/2019 11/06/16   Lottie Mussel T, MD   hydrochlorothiazide (HYDRODIURIL) 25 MG tablet Take 1 tablet (25 mg total) by mouth daily. Patient not taking: Reported on 08/09/2019 11/06/16   Maren Reamer, MD  HYDROcodone-acetaminophen (NORCO/VICODIN) 5-325 MG tablet Take 1 tablet by mouth every 6 (six) hours as needed for moderate pain or severe pain. Patient not taking: Reported on 08/09/2019 01/22/18   Vanessa Kick, MD  ondansetron (ZOFRAN) 4 MG tablet Take 1 tablet (4 mg total) by mouth every 6 (six) hours as needed for nausea or vomiting. Patient not taking: Reported on 08/09/2019 10/24/16   Maren Reamer, MD  oxyCODONE-acetaminophen (PERCOCET/ROXICET) 5-325 MG tablet Take 1 tablet by mouth every 6 (six) hours as needed for severe pain. Patient not taking: Reported on 08/09/2019 04/29/18   Hedges, Dellis Filbert, PA-C  pantoprazole (PROTONIX) 40 MG tablet Take 1 tablet (40 mg total) by mouth daily. Patient not taking: Reported on 08/09/2019 08/29/16   Lottie Mussel T, MD  pravastatin (PRAVACHOL) 20 MG tablet Take 1 tablet (20 mg total) by mouth daily. Patient not taking: Reported on 10/30/2016 08/30/16   Maren Reamer, MD    Family History Family History  Problem Relation Age of Onset  . COPD Mother   . Drug abuse Mother        EtOH  . Inflammatory bowel disease Mother   . Cystic fibrosis Mother   . Irritable bowel syndrome Mother   . Diabetes Father   . Kidney disease Father   . Eczema Brother   . Irritable bowel syndrome Brother   . Mental retardation Brother        Down's Syndrome  . Breast cancer Maternal Grandmother   . Colitis Maternal Grandmother   . Breast cancer Paternal Grandmother   . Prostate cancer Paternal Uncle        x 2  . Crohn's disease Cousin        x 2    Social History Social History   Tobacco Use  . Smoking status: Current Every Day Smoker    Packs/day: 0.50    Types: Cigarettes  . Smokeless tobacco: Never Used  . Tobacco comment: form given 08-02-16  Substance Use Topics  . Alcohol use:  No  . Drug use: Yes    Frequency: 5.0 times per week    Types: Marijuana    Comment: or more     Allergies   Patient has no known allergies.   Review of Systems Review of Systems  Constitutional: Negative for fever.  Respiratory: Negative for cough and shortness of breath.   Cardiovascular: Negative for chest pain.  Gastrointestinal: Positive for abdominal pain. Negative for nausea and vomiting.  Genitourinary: Positive for dysuria and hematuria. Negative for flank pain.  Neurological: Negative for weakness, numbness and headaches.  All other systems reviewed and are negative.    Physical Exam Updated Vital Signs BP 130/84   Pulse 68  Temp 98.8 F (37.1 C) (Oral)   Resp 13   SpO2 96%   Physical Exam Vitals signs and nursing note reviewed. Exam conducted with a chaperone present.  Constitutional:      Appearance: Normal appearance. He is well-developed.     Comments: Appears uncomfortable  HENT:     Head: Normocephalic and atraumatic.  Eyes:     General: Lids are normal.     Conjunctiva/sclera: Conjunctivae normal.     Pupils: Pupils are equal, round, and reactive to light.  Neck:     Musculoskeletal: Full passive range of motion without pain.  Cardiovascular:     Rate and Rhythm: Normal rate and regular rhythm.     Pulses: Normal pulses.     Heart sounds: Normal heart sounds. No murmur. No friction rub. No gallop.   Pulmonary:     Effort: Pulmonary effort is normal.     Breath sounds: Normal breath sounds.     Comments: Lungs clear to auscultation bilaterally.  Symmetric chest rise.  No wheezing, rales, rhonchi. Abdominal:     Palpations: Abdomen is soft. Abdomen is not rigid.     Tenderness: There is abdominal tenderness in the right lower quadrant, suprapubic area and left lower quadrant. There is no right CVA tenderness, left CVA tenderness or guarding.     Comments: Abdomen soft, nondistended.  Diffuse tenderness noted to lower abdomen but is worse in the  left lower quadrant.  No CVA tenderness.  Genitourinary:    Penis: Normal and circumcised.      Scrotum/Testes:        Right: Tenderness or swelling not present.        Left: Tenderness or swelling not present.     Comments: The exam was performed with a chaperone present. Normal male genitalia. No evidence of rash, ulcers or lesions.  No tenderness palpation noted to bilateral testicles.  No overlying warmth, erythema, edema.  No tenderness palpation at perineum.  No crepitus.  Musculoskeletal: Normal range of motion.  Skin:    General: Skin is warm and dry.     Capillary Refill: Capillary refill takes less than 2 seconds.  Neurological:     Mental Status: He is alert and oriented to person, place, and time.  Psychiatric:        Speech: Speech normal.      ED Treatments / Results  Labs (all labs ordered are listed, but only abnormal results are displayed) Labs Reviewed  COMPREHENSIVE METABOLIC PANEL - Abnormal; Notable for the following components:      Result Value   Glucose, Bld 122 (*)    All other components within normal limits  CBC - Abnormal; Notable for the following components:   WBC 17.9 (*)    All other components within normal limits  URINALYSIS, ROUTINE W REFLEX MICROSCOPIC - Abnormal; Notable for the following components:   Color, Urine ORANGE (*)    Specific Gravity, Urine >1.030 (*)    All other components within normal limits  CULTURE, BLOOD (ROUTINE X 2)  CULTURE, BLOOD (ROUTINE X 2)  SARS CORONAVIRUS 2 (TAT 6-24 HRS)  LIPASE, BLOOD  LACTIC ACID, PLASMA  LACTIC ACID, PLASMA    EKG None  Radiology Ct Renal Stone Study  Result Date: 08/09/2019 CLINICAL DATA:  Abdominal pain. History of diverticulitis. Flank pain. EXAM: CT ABDOMEN AND PELVIS WITHOUT CONTRAST TECHNIQUE: Multidetector CT imaging of the abdomen and pelvis was performed following the standard protocol without IV contrast. COMPARISON:  CT abdomen  pelvis 10/30/2016 FINDINGS: Lower chest:  Normal heart size. Dependent atelectasis within the left lower lobe. No pleural effusion. Hepatobiliary: Liver is normal in size and contour. Gallbladder is unremarkable. Pancreas: Unremarkable Spleen: Unremarkable Adrenals/Urinary Tract: Normal adrenal glands. Kidneys are symmetric in size. No hydronephrosis. Urinary bladder is unremarkable. Stomach/Bowel: Circumferential wall thickening of the sigmoid colon (image 75; series 2). Focal outpouching of soft tissue along the margin of the colon (image 74; series 2). There is surrounding mesenteric fat stranding as well as gas within the mesentery coursing with in the mid aspect of the abdomen compatible with perforation. No evidence for upstream bowel obstruction. Normal morphology of the stomach. Vascular/Lymphatic: Normal caliber abdominal aorta. No retroperitoneal lymphadenopathy. Reproductive: Prostate is unremarkable. Other: None. Musculoskeletal: No aggressive or acute appearing osseous lesions. IMPRESSION: Overall findings are compatible with either focal perforated colitis or diverticulitis of the sigmoid colon. There is circumferential wall thickening of the sigmoid colon compatible with colitis or potentially diverticulitis. There is a soft tissue outpouching along the margin of the colon at the site of focal perforation. There is gas within the adjacent colonic mesentery extending cranially within the abdomen. Critical Value/emergent results were called by telephone at the time of interpretation on 08/09/2019 at 1:11 pm to [place] , who verbally acknowledged these results. Electronically Signed   By: Lovey Newcomer M.D.   On: 08/09/2019 13:13    Procedures .Critical Care Performed by: Volanda Napoleon, PA-C Authorized by: Volanda Napoleon, PA-C   Critical care provider statement:    Critical care time (minutes):  35   Critical care was necessary to treat or prevent imminent or life-threatening deterioration of the following  conditions: Abdomen perforation.   Critical care was time spent personally by me on the following activities:  Discussions with consultants, evaluation of patient's response to treatment, examination of patient, ordering and performing treatments and interventions, ordering and review of laboratory studies, ordering and review of radiographic studies, pulse oximetry, re-evaluation of patient's condition, obtaining history from patient or surrogate and review of old charts   (including critical care time)  Medications Ordered in ED Medications  morphine 4 MG/ML injection 4 mg (4 mg Intravenous Given 08/09/19 1212)  ondansetron (ZOFRAN) injection 4 mg (4 mg Intravenous Given 08/09/19 1212)  sodium chloride 0.9 % bolus 1,000 mL (0 mLs Intravenous Stopped 08/09/19 1313)  fentaNYL (SUBLIMAZE) injection 100 mcg (100 mcg Intravenous Given 08/09/19 1313)  piperacillin-tazobactam (ZOSYN) IVPB 3.375 g (3.375 g Intravenous New Bag/Given 08/09/19 1348)  HYDROmorphone (DILAUDID) injection 1 mg (1 mg Intravenous Given 08/09/19 1347)  morphine 4 MG/ML injection 4 mg (4 mg Intravenous Given 08/09/19 1347)  sodium chloride 0.9 % bolus 1,000 mL (1,000 mLs Intravenous Bolus from Bag 08/09/19 1347)     Initial Impression / Assessment and Plan / ED Course  I have reviewed the triage vital signs and the nursing notes.  Pertinent labs & imaging results that were available during my care of the patient were reviewed by me and considered in my medical decision making (see chart for details).        43 y.o. M who presents for abdominal pain, dysuria, hematuria. Recently started on Cipro and another antibiotics. Patient is afebrile, non-toxic appearing, sitting comfortably on examination table. Vital signs reviewed and stable.  No CVA tenderness noted but does have lower abdominal pain, left greater than right.  He reports that he feels like his bilateral testicles are swollen he states this is happened before.  On my  evaluation, they do not appear to be swollen.  He has no perineal pain.  No crepitus.  Consider kidney stone versus intra-abdominal infectious etiology.  History/physical exam concerning for Fournier's.  CBC shows leukocytosis of 17.9. Hgb normal. CMP with normal BUN/Cr. UA shows orange color.  No infectious etiology.  CT scan shows findings compatible with focal perforated colitis or diverticulitis.   Will start patient on Zosyn for broad-spectrum antibiotic coverage.  Will consult surgery.  Discussed patient with Dr. Harlow Asa (Gen Surg). He will consult on patient. Requests medical admission. He does not think patient will be going to the OR today. Will consult medicine for admission.   Discussed patient with Dr. Tyrell Antonio (hospiatlist). Will plan for admission.   Portions of this note were generated with [employer]. Dictation errors may occur despite best attempts at proofreading.    Final Clinical Impressions(s) / ED Diagnoses   Final diagnoses:  Perforated diverticulum    ED Discharge Orders    None       Volanda Napoleon, PA-C 08/09/19 1434    Drenda Freeze, MD 08/10/19 1202

## 2019-08-09 NOTE — Consult Note (Signed)
General Surgery [facility]  Reason for Consult: colitis/diverticulitis (recurrent) with contained perforation  Referring Physician: Dr. Tyrell Antonio, [facility]  Eric Peck is an 43 y.o. male.  HPI: Patient is a 43 year old male known to our surgical service from prior admissions with colitis versus diverticulitis and microperforation in 2016.  Patient was managed medically with antibiotic therapy and bowel rest.  He did undergo a colonoscopy by [facility] gastroenterology which showed circumferential thickening of the colonic mucosa.  Patient was followed in our practice by Dr. [name].  The patient has never undergone resection.  He was told he had chronic diverticulitis.  Approximately 4 days ago the patient developed lower abdominal pain.  Patient noted low-grade fever and chills.  He was seen by his primary care physician.  He was diagnosed with either kidney stone or urinary tract infection and empirically started on antibiotics.  Pain persisted and became worse.  The most severe pain was early this morning.  He presented to the emergency department for evaluation.  Laboratory studies showed an elevated white blood cell count of 17.9.  CT scan of the abdomen and pelvis was performed without contrast.  While this study is of limited usefulness, there are inflammatory changes around a thickened segment of sigmoid colon representing either colitis or diverticulitis.  There is a small amount of air which appears extraluminal and may be within the mesentery.  There is no free air within the peritoneal cavity.  Patient is being admitted to the medical service and will start on intravenous antibiotics.  Surgery has been asked to evaluate and follow during this hospitalization.  Patient has had no prior abdominal surgery.  He works as a Games developer.  Past Medical History:  Diagnosis Date  . Anxiety   . Arthritis   . CTS (carpal tunnel syndrome)   . Diverticulitis of  large intestine with perforation 03/2015   microperf of sigmoid colitis, tx with abx, no surg  . GERD (gastroesophageal reflux disease)   . Hypertension 11/06/2016    Past Surgical History:  Procedure Laterality Date  . COLONOSCOPY N/A 04/23/2015   Procedure: DIAGNOSTIC COLONOSCOPY;  Surgeon: [name], MD;  Location: [facility];  Service: Endoscopy;  Laterality: N/A;  . VASECTOMY      Family History  Problem Relation Age of Onset  . COPD Mother   . Drug abuse Mother        EtOH  . Inflammatory bowel disease Mother   . Cystic fibrosis Mother   . Irritable bowel syndrome Mother   . Diabetes Father   . Kidney disease Father   . Eczema Brother   . Irritable bowel syndrome Brother   . Mental retardation Brother        Down's Syndrome  . Breast cancer Maternal Grandmother   . Colitis Maternal Grandmother   . Breast cancer Paternal Grandmother   . Prostate cancer Paternal Uncle        x 2  . Crohn's disease Cousin        x 2    Social History:  reports that he has been smoking cigarettes. He has been smoking about 0.50 packs per day. He has never used smokeless tobacco. He reports current drug use. Frequency: 5.00 times per week. Drug: Marijuana. He reports that he does not drink alcohol.  Allergies: No Known Allergies  Medications: I have reviewed the patient's current medications.  Results for orders placed or performed during the hospital encounter of 08/09/19 (from the past  48 hour(s))  Lipase, blood     Status: None   Collection Time: 08/09/19 11:39 AM  Result Value Ref Range   Lipase 25 11 - 51 U/L    Comment: Performed at [facility], [place] [address], [place], [place] [address]  Comprehensive metabolic panel     Status: Abnormal   Collection Time: 08/09/19 11:39 AM  Result Value Ref Range   Sodium 139 135 - 145 mmol/L   Potassium 3.9 3.5 - 5.1 mmol/L   Chloride 104 98 - 111 mmol/L   CO2 25 22 - 32 mmol/L   Glucose, Bld 122 (H) 70 - 99 mg/dL    BUN 17 6 - 20 mg/dL   Creatinine, Ser 1.06 0.61 - 1.24 mg/dL   Calcium 9.2 8.9 - 10.3 mg/dL   Total Protein 7.7 6.5 - 8.1 g/dL   Albumin 4.4 3.5 - 5.0 g/dL   AST 21 15 - 41 U/L   ALT 23 0 - 44 U/L   Alkaline Phosphatase 59 38 - 126 U/L   Total Bilirubin 0.6 0.3 - 1.2 mg/dL   GFR calc non Af Amer >60 >60 mL/min   GFR calc Af Amer >60 >60 mL/min   Anion gap 10 5 - 15    Comment: Performed at [facility], [place] [address], [place], [place] [address]  CBC     Status: Abnormal   Collection Time: 08/09/19 11:39 AM  Result Value Ref Range   WBC 17.9 (H) 4.0 - 10.5 K/uL   RBC 5.73 4.22 - 5.81 MIL/uL   Hemoglobin 16.9 13.0 - 17.0 g/dL   HCT 51.3 39.0 - 52.0 %   MCV 89.5 80.0 - 100.0 fL   MCH 29.5 26.0 - 34.0 pg   MCHC 32.9 30.0 - 36.0 g/dL   RDW 12.8 11.5 - 15.5 %   Platelets 251 150 - 400 K/uL   nRBC 0.0 0.0 - 0.2 %    Comment: Performed at [facility], [place] [address], [place], [place] [address]  Urinalysis, Routine w reflex microscopic     Status: Abnormal   Collection Time: 08/09/19 11:39 AM  Result Value Ref Range   Color, Urine ORANGE (A) YELLOW    Comment: BIOCHEMICALS MAY BE AFFECTED BY COLOR ORANGE    APPearance CLEAR CLEAR    Comment: CLEAR   Specific Gravity, Urine >1.030 (H) 1.005 - 1.030   pH 6.5 5.0 - 8.0   Glucose, UA NEGATIVE NEGATIVE mg/dL   Hgb urine dipstick NEGATIVE NEGATIVE   Bilirubin Urine NEGATIVE NEGATIVE   Ketones, ur NEGATIVE NEGATIVE mg/dL   Protein, ur NEGATIVE NEGATIVE mg/dL   Nitrite NEGATIVE NEGATIVE   Leukocytes,Ua NEGATIVE NEGATIVE    Comment: Microscopic not done on urines with negative protein, blood, leukocytes, nitrite, or glucose < 500 mg/dL. Performed at [facility], [place] [address] [place], [place] [address]   Lactic acid, plasma     Status: None   Collection Time: 08/09/19  1:12 PM  Result Value Ref Range   Lactic Acid, Venous 1.5 0.5 - 1.9 mmol/L    Comment: Performed at  [facility], [place] [address] [place], [place] [address]    Ct Renal Stone Study  Result Date: 08/09/2019 CLINICAL DATA:  Abdominal pain. History of diverticulitis. Flank pain. EXAM: CT ABDOMEN AND PELVIS WITHOUT CONTRAST TECHNIQUE: Multidetector CT imaging of the abdomen and pelvis was performed following the standard protocol without IV contrast. COMPARISON:  CT abdomen  pelvis 10/30/2016 FINDINGS: Lower chest: Normal heart size. Dependent atelectasis within the left lower lobe. No pleural effusion. Hepatobiliary: Liver is normal in size and contour. Gallbladder is unremarkable. Pancreas: Unremarkable Spleen: Unremarkable Adrenals/Urinary Tract: Normal adrenal glands. Kidneys are symmetric in size. No hydronephrosis. Urinary bladder is unremarkable. Stomach/Bowel: Circumferential wall thickening of the sigmoid colon (image 75; series 2). Focal outpouching of soft tissue along the margin of the colon (image 74; series 2). There is surrounding mesenteric fat stranding as well as gas within the mesentery coursing with in the mid aspect of the abdomen compatible with perforation. No evidence for upstream bowel obstruction. Normal morphology of the stomach. Vascular/Lymphatic: Normal caliber abdominal aorta. No retroperitoneal lymphadenopathy. Reproductive: Prostate is unremarkable. Other: None. Musculoskeletal: No aggressive or acute appearing osseous lesions. IMPRESSION: Overall findings are compatible with either focal perforated colitis or diverticulitis of the sigmoid colon. There is circumferential wall thickening of the sigmoid colon compatible with colitis or potentially diverticulitis. There is a soft tissue outpouching along the margin of the colon at the site of focal perforation. There is gas within the adjacent colonic mesentery extending cranially within the abdomen. Critical Value/emergent results were called by telephone at the time of interpretation on 08/09/2019 at 1:11 pm to  Barrera , who verbally acknowledged these results. Electronically Signed   By: Lovey Newcomer M.D.   On: 08/09/2019 13:13    Review of Systems  Constitutional: Positive for chills and fever.  HENT: Negative.   Eyes: Negative.   Respiratory: Negative.   Cardiovascular: Negative.   Gastrointestinal: Positive for abdominal pain.  Genitourinary: Negative.   Musculoskeletal: Negative.   Skin: Negative.   Neurological: Negative.   Endo/Heme/Allergies: Negative.   Psychiatric/Behavioral: Negative.    Blood pressure 123/73, pulse 71, temperature 98.8 F (37.1 C), temperature source Oral, resp. rate (!) 24, height 6' (1.829 m), weight 98.4 kg, SpO2 97 %. Physical Exam  Constitutional: He is oriented to person, place, and time. He appears well-developed and well-nourished. No distress.  HENT:  Head: Normocephalic and atraumatic.  Right Ear: External ear normal.  Left Ear: External ear normal.  Eyes: Pupils are equal, round, and reactive to light. Conjunctivae are normal. No scleral icterus.  Neck: Normal range of motion. Neck supple. No tracheal deviation present. No thyromegaly present.  Cardiovascular: Normal rate, regular rhythm and normal heart sounds.  No murmur heard. Respiratory: Effort normal and breath sounds normal. No respiratory distress. He has no wheezes.  GI: Soft. Bowel sounds are normal. He exhibits no distension and no mass. There is abdominal tenderness (suprapubic max, bilat lower quads). There is guarding. There is no rebound.  Musculoskeletal: Normal range of motion.        General: No deformity or edema.  Lymphadenopathy:    He has no cervical adenopathy.  Neurological: He is alert and oriented to person, place, and time.  Skin: Skin is warm and dry. He is not diaphoretic.  Psychiatric: He has a normal mood and affect. His behavior is normal.    Assessment/Plan: Acute colitis / diverticulitis with probable microperforation, small air in mesentery -  agree with admission for IV abx's - NPO, IVF - pain control  Surgery will follow with you.  I will notify Dr. [name] of the patient's admission.  There does not appear to be any significant fluid collection or drainable abscess at this time.  Depending on his clinical course, the patient may require repeat CT scan in 3 to 5 days.  No role  for acute surgical intervention at this point.  Armandina Gemma, [place] [facility] Office: [place] 08/09/2019, 3:08 PM

## 2019-08-09 NOTE — Progress Notes (Signed)
Pharmacy Antibiotic Note  Eric Peck is a 43 y.o. male admitted on 08/09/2019 with acute colitis/diverticulitis with probable microperforation .  Pharmacy has been consulted for Zosyn dosing.  Plan:  Zosyn 3.375g IV x 1 over 30 minutes given in the ED. Continue with Zosyn 3.375g IV q8h (each dose infused over 4 hours).  Need for further dosage adjustment appears unlikely at present, so pharmacy will sign off at this time.  Please reconsult if a change in clinical status warrants re-evaluation of dosage.  Height: 6' (182.9 cm) Weight: 217 lb (98.4 kg) IBW/kg (Calculated) : 77.6  Temp (24hrs), Avg:98.7 F (37.1 C), Min:98.6 F (37 C), Max:98.8 F (37.1 C)  Recent Labs  Lab 08/09/19 1139 08/09/19 1312 08/09/19 1550  WBC 17.9*  --   --   CREATININE 1.06  --   --   LATICACIDVEN  --  1.5 1.6    Estimated Creatinine Clearance: 111.4 mL/min (by C-G formula based on SCr of 1.06 mg/dL).    No Known Allergies   Thank you for allowing pharmacy to be a part of this patient's care.  Luiz Ochoa 08/09/2019 5:16 PM

## 2019-08-09 NOTE — ED Notes (Signed)
ED TO INPATIENT HANDOFF REPORT  ED Nurse Name and Phone #:   S Name/Age/Gender Eric Peck 43 y.o. male Room/Bed: WOTF/NONE  Code Status   Code Status: Prior  Home/SNF/Other Home Patient oriented to: self, place, time and situation Is this baseline? Yes   Triage Complete: Triage complete  Chief Complaint abdominal pain  Triage Note Per EMS-states abdominal pain that started last night-states he was on Cipro for a UTI-states he has a history diverticulitis and states pain feels like that-states some pain in left flank area   Allergies No Known Allergies  Level of Care/Admitting Diagnosis ED Disposition    ED Disposition Condition [place]: [place] [[id number]]  Level of Care: Med-Surg [16]  Covid Evaluation: Asymptomatic Screening Protocol (No Symptoms)  Diagnosis: Diverticulitis of colon with perforation [id number]  Admitting Physician: Elmarie Shiley [phone]  Attending Physician: Elmarie Shiley [phone]  Estimated length of stay: 3 - 4 days  Certification:: I certify this patient will need inpatient services for at least 2 midnights  PT Class (Do Not Modify): Inpatient [101]  PT Acc Code (Do Not Modify): Private [1]       B Medical/Surgery History Past Medical History:  Diagnosis Date  . Anxiety   . Arthritis   . CTS (carpal tunnel syndrome)   . Diverticulitis of large intestine with perforation 03/2015   microperf of sigmoid colitis, tx with abx, no surg  . GERD (gastroesophageal reflux disease)   . Hypertension 11/06/2016   Past Surgical History:  Procedure Laterality Date  . COLONOSCOPY N/A 04/23/2015   Procedure: DIAGNOSTIC COLONOSCOPY;  Surgeon: [name], MD;  Location: [facility];  Service: Endoscopy;  Laterality: N/A;  . VASECTOMY       A IV Location/Drains/Wounds Patient Lines/Drains/Airways Status   Active Line/Drains/Airways    Name:   Placement date:   Placement time:   Site:   Days:    Peripheral IV 10/30/16 Right Antecubital   10/30/16    1132    Antecubital   1013   Peripheral IV 08/09/19 Right Antecubital   08/09/19    1149    Antecubital   less than 1          Intake/Output Last 24 hours No intake or output data in the 24 hours ending 08/09/19 1506  Labs/Imaging Results for orders placed or performed during the hospital encounter of 08/09/19 (from the past 48 hour(s))  Lipase, blood     Status: None   Collection Time: 08/09/19 11:39 AM  Result Value Ref Range   Lipase 25 11 - 51 U/L    Comment: Performed at [facility], [place] [address] [place], [place] [address]  Comprehensive metabolic panel     Status: Abnormal   Collection Time: 08/09/19 11:39 AM  Result Value Ref Range   Sodium 139 135 - 145 mmol/L   Potassium 3.9 3.5 - 5.1 mmol/L   Chloride 104 98 - 111 mmol/L   CO2 25 22 - 32 mmol/L   Glucose, Bld 122 (H) 70 - 99 mg/dL   BUN 17 6 - 20 mg/dL   Creatinine, Ser 1.06 0.61 - 1.24 mg/dL   Calcium 9.2 8.9 - 10.3 mg/dL   Total Protein 7.7 6.5 - 8.1 g/dL   Albumin 4.4 3.5 - 5.0 g/dL   AST 21 15 - 41 U/L   ALT 23 0 - 44 U/L   Alkaline Phosphatase 59 38 - 126 U/L  Total Bilirubin 0.6 0.3 - 1.2 mg/dL   GFR calc non Af Amer >60 >60 mL/min   GFR calc Af Amer >60 >60 mL/min   Anion gap 10 5 - 15    Comment: Performed at [facility], [place] [address], [place], [place] [address]  CBC     Status: Abnormal   Collection Time: 08/09/19 11:39 AM  Result Value Ref Range   WBC 17.9 (H) 4.0 - 10.5 K/uL   RBC 5.73 4.22 - 5.81 MIL/uL   Hemoglobin 16.9 13.0 - 17.0 g/dL   HCT 51.3 39.0 - 52.0 %   MCV 89.5 80.0 - 100.0 fL   MCH 29.5 26.0 - 34.0 pg   MCHC 32.9 30.0 - 36.0 g/dL   RDW 12.8 11.5 - 15.5 %   Platelets 251 150 - 400 K/uL   nRBC 0.0 0.0 - 0.2 %    Comment: Performed at [facility], [place] [address], [place], [place] [address]  Urinalysis, Routine w reflex microscopic     Status: Abnormal   Collection  Time: 08/09/19 11:39 AM  Result Value Ref Range   Color, Urine ORANGE (A) YELLOW    Comment: BIOCHEMICALS MAY BE AFFECTED BY COLOR ORANGE    APPearance CLEAR CLEAR    Comment: CLEAR   Specific Gravity, Urine >1.030 (H) 1.005 - 1.030   pH 6.5 5.0 - 8.0   Glucose, UA NEGATIVE NEGATIVE mg/dL   Hgb urine dipstick NEGATIVE NEGATIVE   Bilirubin Urine NEGATIVE NEGATIVE   Ketones, ur NEGATIVE NEGATIVE mg/dL   Protein, ur NEGATIVE NEGATIVE mg/dL   Nitrite NEGATIVE NEGATIVE   Leukocytes,Ua NEGATIVE NEGATIVE    Comment: Microscopic not done on urines with negative protein, blood, leukocytes, nitrite, or glucose < 500 mg/dL. Performed at [facility], [place] [address] [place], [place] [address]   Lactic acid, plasma     Status: None   Collection Time: 08/09/19  1:12 PM  Result Value Ref Range   Lactic Acid, Venous 1.5 0.5 - 1.9 mmol/L    Comment: Performed at [facility], [place] [address], [place], [place] [address]   Ct Renal Stone Study  Result Date: 08/09/2019 CLINICAL DATA:  Abdominal pain. History of diverticulitis. Flank pain. EXAM: CT ABDOMEN AND PELVIS WITHOUT CONTRAST TECHNIQUE: Multidetector CT imaging of the abdomen and pelvis was performed following the standard protocol without IV contrast. COMPARISON:  CT abdomen pelvis 10/30/2016 FINDINGS: Lower chest: Normal heart size. Dependent atelectasis within the left lower lobe. No pleural effusion. Hepatobiliary: Liver is normal in size and contour. Gallbladder is unremarkable. Pancreas: Unremarkable Spleen: Unremarkable Adrenals/Urinary Tract: Normal adrenal glands. Kidneys are symmetric in size. No hydronephrosis. Urinary bladder is unremarkable. Stomach/Bowel: Circumferential wall thickening of the sigmoid colon (image 75; series 2). Focal outpouching of soft tissue along the margin of the colon (image 74; series 2). There is surrounding mesenteric fat stranding as well as gas within the mesentery  coursing with in the mid aspect of the abdomen compatible with perforation. No evidence for upstream bowel obstruction. Normal morphology of the stomach. Vascular/Lymphatic: Normal caliber abdominal aorta. No retroperitoneal lymphadenopathy. Reproductive: Prostate is unremarkable. Other: None. Musculoskeletal: No aggressive or acute appearing osseous lesions. IMPRESSION: Overall findings are compatible with either focal perforated colitis or diverticulitis of the sigmoid colon. There is circumferential wall thickening of the sigmoid colon compatible with colitis or potentially diverticulitis. There is a soft tissue outpouching along the margin of the colon at the site of focal  perforation. There is gas within the adjacent colonic mesentery extending cranially within the abdomen. Critical Value/emergent results were called by telephone at the time of interpretation on 08/09/2019 at 1:11 pm to [place] , who verbally acknowledged these results. Electronically Signed   By: Lovey Newcomer M.D.   On: 08/09/2019 13:13    Pending Labs Unresulted Labs (From admission, onward)    Start     Ordered   08/09/19 1339  SARS CORONAVIRUS 2 (TAT 6-24 HRS) Nasopharyngeal Nasopharyngeal Swab  (Asymptomatic/Tier 2 Patients Labs)  Once,   STAT    Question Answer Comment  Is this test for diagnosis or screening Screening   Symptomatic for COVID-19 as defined by CDC No   Hospitalized for COVID-19 No   Admitted to ICU for COVID-19 No   Previously tested for COVID-19 No   Resident in a congregate (group) care setting No   Employed in healthcare setting No      08/09/19 1338   08/09/19 1323  Blood culture (routine x 2)  BLOOD CULTURE X 2,   STAT     08/09/19 1323   08/09/19 1312  Lactic acid, plasma  Now then every 2 hours,   STAT     08/09/19 1311   Signed and Held  HIV Antibody (routine testing w rflx)  (HIV Antibody (Routine testing w reflex) panel)  Once,   R     Signed and Held   Signed and Held   Creatinine, serum  (enoxaparin (LOVENOX)    CrCl < 30 ml/min)  Weekly,   R    Comments: while on enoxaparin therapy.    Signed and Held   Signed and Held  Comprehensive metabolic panel  Tomorrow morning,   R     Signed and Held   Signed and Held  CBC  Tomorrow morning,   R     Signed and Held          Vitals/Pain Today's Vitals   08/09/19 1430 08/09/19 1457 08/09/19 1457 08/09/19 1500  BP: 130/84   123/73  Pulse: 68   71  Resp: 13   (!) 24  Temp:      TempSrc:      SpO2: 96%   97%  Weight:  98.4 kg    Height:  6' (1.829 m)    PainSc:   7      Isolation Precautions No active isolations  Medications Medications  pantoprazole (PROTONIX) injection 40 mg (has no administration in time range)  HYDROmorphone (DILAUDID) injection 1 mg (has no administration in time range)  morphine 4 MG/ML injection 4 mg (4 mg Intravenous Given 08/09/19 1212)  ondansetron (ZOFRAN) injection 4 mg (4 mg Intravenous Given 08/09/19 1212)  sodium chloride 0.9 % bolus 1,000 mL (0 mLs Intravenous Stopped 08/09/19 1313)  fentaNYL (SUBLIMAZE) injection 100 mcg (100 mcg Intravenous Given 08/09/19 1313)  piperacillin-tazobactam (ZOSYN) IVPB 3.375 g (0 g Intravenous Stopped 08/09/19 1434)  HYDROmorphone (DILAUDID) injection 1 mg (1 mg Intravenous Given 08/09/19 1347)  morphine 4 MG/ML injection 4 mg (4 mg Intravenous Given 08/09/19 1347)  sodium chloride 0.9 % bolus 1,000 mL (0 mLs Intravenous Stopped 08/09/19 1457)    Mobility walks Low fall risk   Focused Assessments    R Recommendations: See Admitting Provider Note  Report given to:   Additional Notes:

## 2019-08-09 NOTE — Progress Notes (Signed)
Called to get report at 1445. Will wait for report to be called.

## 2019-08-09 NOTE — ED Notes (Signed)
Urinal at bedside. Pt has been made aware that provider needs UA sample.

## 2019-08-09 NOTE — ED Triage Notes (Signed)
Per EMS-states abdominal pain that started last night-states he was on Cipro for a UTI-states he has a history diverticulitis and states pain feels like that-states some pain in left flank area

## 2019-08-09 NOTE — H&P (Addendum)
History and Physical  MILES NEUPERT [id number] DOB: [birth year] DOA: 08/09/2019  PCP: Trey Sailors, PA Patient coming from: Home   I have personally briefly reviewed patient's old medical records in [place]   Chief Complaint: Abdominal pain   HPI: TORIEN EMMETT is a 43 y.o. male past medical history significant for diverticulitis with microperforation in 2016, hypertension, GERD, arthritis who presents complaining of worsening abdominal pain, hematuria and dysuria.  Patient report abdominal pain yesterday 3 days prior to admission.  Lower quadrant associated with nausea.  He reports some hematuria.  He was evaluated at urgent care and was diagnosed with UTI and was given antibiotics.  He reports swelling of his testicle.  He reported low-grade fever.  Patient relate that abdominal pain got so severe that he could not stand up straight.  For multiple episode of vomiting, and loose stool watery.  He reports intermittent fever.  He reports pain is sharp, 8 out of 10.  He continues to smoke half a pack of cigarettes per day.   Evaluation in the ED: Sodium 139, potassium 3.9, glucose 122, BUN 17, creatinine 1.0, anion gap 10, alkaline phosphatase 59, albumin 4.4, lipase 25, AST 21, ALT 23, white blood cell 17, hemoglobin 16, platelets 251.  SARS coronavirus 2 pending. CT renal protocol:Overall findings are compatible with either focal perforated colitis or diverticulitis of the sigmoid colon. There is circumferential wall thickening of the sigmoid colon compatible with colitis or potentially diverticulitis. There is a soft tissue outpouching along the margin of the colon at the site of focal perforation. There is gas within the adjacent colonic mesentery extending cranially within the abdomen.    Review of Systems: All systems reviewed and apart from history of presenting illness, are negative.  Past Medical History:  Diagnosis Date   Anxiety    Arthritis    CTS  (carpal tunnel syndrome)    Diverticulitis of large intestine with perforation 03/2015   microperf of sigmoid colitis, tx with abx, no surg   GERD (gastroesophageal reflux disease)    Hypertension 11/06/2016   Past Surgical History:  Procedure Laterality Date   COLONOSCOPY N/A 04/23/2015   Procedure: DIAGNOSTIC COLONOSCOPY;  Surgeon: [name], MD;  Location: [facility];  Service: Endoscopy;  Laterality: N/A;   VASECTOMY     Social History:  reports that he has been smoking cigarettes. He has been smoking about 0.50 packs per day. He has never used smokeless tobacco. He reports current drug use. Frequency: 5.00 times per week. Drug: Marijuana. He reports that he does not drink alcohol.   No Known Allergies  Family History  Problem Relation Age of Onset   COPD Mother    Drug abuse Mother        EtOH   Inflammatory bowel disease Mother    Cystic fibrosis Mother    Irritable bowel syndrome Mother    Diabetes Father    Kidney disease Father    Eczema Brother    Irritable bowel syndrome Brother    Mental retardation Brother        Down's Syndrome   Breast cancer Maternal Grandmother    Colitis Maternal Grandmother    Breast cancer Paternal Grandmother    Prostate cancer Paternal Uncle        x 2   Crohn's disease Cousin        x 2      Prior to Admission medications   Medication Sig Start Date End  Date Taking? Authorizing Provider  ciprofloxacin (CIPRO) 500 MG tablet Take 500 mg by mouth every 12 (twelve) hours.   Yes [provider]  ibuprofen (ADVIL,MOTRIN) 200 MG tablet Take 400-800 mg by mouth every 6 (six) hours as needed for headache or mild pain. Reported on 04/12/2016   Yes [provider]  metroNIDAZOLE (FLAGYL) 500 MG tablet Take 500 mg by mouth 3 (three) times daily.   Yes [provider]  phenazopyridine (PYRIDIUM) 200 MG tablet Take 200 mg by mouth 3 (three) times daily as needed for pain.   Yes [provider]  promethazine (PHENERGAN) 25 MG tablet Take 25 mg by mouth every 6 (six) hours as needed for nausea or vomiting.   Yes [provider]  amitriptyline (ELAVIL) 25 MG tablet Take 1 tablet (25 mg total) by mouth at bedtime as needed for sleep. Patient not taking: Reported on 08/09/2019 12/04/16   Maren Reamer, MD  diclofenac (VOLTAREN) 75 MG EC tablet Take 1 tablet (75 mg total) by mouth 2 (two) times daily. Patient not taking: Reported on 08/09/2019 01/22/18   Vanessa Kick, MD  escitalopram (LEXAPRO) 20 MG tablet Take 1 tablet (20 mg total) by mouth daily. Patient not taking: Reported on 08/09/2019 12/04/16   Maren Reamer, MD  fluticasone [facility]) 50 MCG/ACT nasal spray Place 2 sprays into both nostrils daily. Patient not taking: Reported on 08/09/2019 12/04/16   Maren Reamer, MD  gabapentin (NEURONTIN) 300 MG capsule Take 1 capsule (300 mg total) by mouth at bedtime. Patient not taking: Reported on 08/09/2019 11/06/16   Lottie Mussel T, MD  hydrochlorothiazide (HYDRODIURIL) 25 MG tablet Take 1 tablet (25 mg total) by mouth daily. Patient not taking: Reported on 08/09/2019 11/06/16   Maren Reamer, MD  HYDROcodone-acetaminophen (NORCO/VICODIN) 5-325 MG tablet Take 1 tablet by mouth every 6 (six) hours as needed for moderate pain or severe pain. Patient not taking: Reported on 08/09/2019 01/22/18   Vanessa Kick, MD  ondansetron (ZOFRAN) 4 MG tablet Take 1 tablet (4 mg total) by mouth every 6 (six) hours as needed for nausea or vomiting. Patient not taking: Reported on 08/09/2019 10/24/16   Maren Reamer, MD  oxyCODONE-acetaminophen (PERCOCET/ROXICET) 5-325 MG tablet Take 1 tablet by mouth every 6 (six) hours as needed for severe pain. Patient not taking: Reported on 08/09/2019 04/29/18   Hedges, Dellis Filbert, PA-C  pantoprazole (PROTONIX) 40 MG tablet Take 1 tablet (40 mg total) by mouth daily. Patient not taking: Reported on 08/09/2019 08/29/16   Lottie Mussel T, MD  pravastatin  (PRAVACHOL) 20 MG tablet Take 1 tablet (20 mg total) by mouth daily. Patient not taking: Reported on 10/30/2016 08/30/16   Maren Reamer, MD   Physical Exam: Vitals:   08/09/19 1230 08/09/19 1307 08/09/19 1330 08/09/19 1400  BP: 130/75 131/69 132/71 130/72  Pulse: 62 79 72 75  Resp: 18 (!) 24 (!) 22 15  Temp:      TempSrc:      SpO2: 100% 100% 97% 96%     General exam: Moderately built and nourished patient, lying comfortably supine on the gurney in mild distress due to pain.  Head, eyes and ENT: Nontraumatic and normocephalic. Pupils equally reacting to light and accommodation. Oral mucosa moist.  Neck: Supple. No JVD, carotid bruit or thyromegaly.  Lymphatics: No lymphadenopathy.  Respiratory system: Clear to auscultation. No increased work of breathing.  Cardiovascular system: S1 and S2 heard, RRR. No JVD, murmurs, gallops, clicks or pedal  edema.  Gastrointestinal system: Abdomen is nondistended, obese, very tender on palpation left side.  Central nervous system: Alert and oriented. No focal neurological deficits.  Extremities: Symmetric 5 x 5 power. Peripheral pulses symmetrically felt.   Skin: No rashes or acute findings.  Musculoskeletal system: Negative exam.  Psychiatry: Pleasant and cooperative.   Labs on Admission:  Basic Metabolic Panel: Recent Labs  Lab 08/09/19 1139  NA 139  K 3.9  CL 104  CO2 25  GLUCOSE 122*  BUN 17  CREATININE 1.06  CALCIUM 9.2   Liver Function Tests: Recent Labs  Lab 08/09/19 1139  AST 21  ALT 23  ALKPHOS 59  BILITOT 0.6  PROT 7.7  ALBUMIN 4.4   Recent Labs  Lab 08/09/19 1139  LIPASE 25   No results for input(s): AMMONIA in the last 168 hours. CBC: Recent Labs  Lab 08/09/19 1139  WBC 17.9*  HGB 16.9  HCT 51.3  MCV 89.5  PLT 251   Cardiac Enzymes: No results for input(s): CKTOTAL, CKMB, CKMBINDEX, TROPONINI in the last 168 hours.  BNP (last 3 results) No results for input(s): PROBNP in the last  8760 hours. CBG: No results for input(s): GLUCAP in the last 168 hours.  Radiological Exams on Admission: Ct Renal Stone Study  Result Date: 08/09/2019 CLINICAL DATA:  Abdominal pain. History of diverticulitis. Flank pain. EXAM: CT ABDOMEN AND PELVIS WITHOUT CONTRAST TECHNIQUE: Multidetector CT imaging of the abdomen and pelvis was performed following the standard protocol without IV contrast. COMPARISON:  CT abdomen pelvis 10/30/2016 FINDINGS: Lower chest: Normal heart size. Dependent atelectasis within the left lower lobe. No pleural effusion. Hepatobiliary: Liver is normal in size and contour. Gallbladder is unremarkable. Pancreas: Unremarkable Spleen: Unremarkable Adrenals/Urinary Tract: Normal adrenal glands. Kidneys are symmetric in size. No hydronephrosis. Urinary bladder is unremarkable. Stomach/Bowel: Circumferential wall thickening of the sigmoid colon (image 75; series 2). Focal outpouching of soft tissue along the margin of the colon (image 74; series 2). There is surrounding mesenteric fat stranding as well as gas within the mesentery coursing with in the mid aspect of the abdomen compatible with perforation. No evidence for upstream bowel obstruction. Normal morphology of the stomach. Vascular/Lymphatic: Normal caliber abdominal aorta. No retroperitoneal lymphadenopathy. Reproductive: Prostate is unremarkable. Other: None. Musculoskeletal: No aggressive or acute appearing osseous lesions. IMPRESSION: Overall findings are compatible with either focal perforated colitis or diverticulitis of the sigmoid colon. There is circumferential wall thickening of the sigmoid colon compatible with colitis or potentially diverticulitis. There is a soft tissue outpouching along the margin of the colon at the site of focal perforation. There is gas within the adjacent colonic mesentery extending cranially within the abdomen. Critical Value/emergent results were called by telephone at the time of interpretation  on 08/09/2019 at 1:11 pm to [place] , who verbally acknowledged these results. Electronically Signed   By: Lovey Newcomer M.D.   On: 08/09/2019 13:13    EKG: Independently reviewed.   Assessment/Plan Active Problems:   Hypertension   Diverticulitis of intestine with perforation   GERD (gastroesophageal reflux disease)   Leukocytosis   1-Acute sigmoid diverticulitis versus colitis with perforation; -Admit to the hospital for IV antibiotics and further surgical evaluation. -Surgery has been consulted. -N.p.o. status, D5 IV fluids. -Continue with IV Zosyn, pharmacy to dose.  2-Hypertension: He has not been taking blood pressure medication, he report anxiety after taking blood pressure medication. Monitor for now, blood pressure in the 130 range.  3-GERD: IV Protonix twice daily.  4-Current smoker: Counseling provided.   5-Leukocytosis; related to number one.   DVT Prophylaxis: Lovenox Code Status: Full code Family Communication: Care discussed with patient Disposition Plan: Admit to the hospital under inpatient status, patient will be n.p.o., unable to tolerate oral antibiotics, need IV fluids IV antibiotics and further management of perforated diverticulitis.  Time spent: 75 minutes.   Elmarie Shiley MD [facility]   08/09/2019, 2:20 PM

## 2019-08-10 LAB — COMPREHENSIVE METABOLIC PANEL
ALT: 17 U/L (ref 0–44)
AST: 14 U/L — ABNORMAL LOW (ref 15–41)
Albumin: 3.6 g/dL (ref 3.5–5.0)
Alkaline Phosphatase: 47 U/L (ref 38–126)
Anion gap: 8 (ref 5–15)
BUN: 18 mg/dL (ref 6–20)
CO2: 22 mmol/L (ref 22–32)
Calcium: 8 mg/dL — ABNORMAL LOW (ref 8.9–10.3)
Chloride: 106 mmol/L (ref 98–111)
Creatinine, Ser: 0.95 mg/dL (ref 0.61–1.24)
GFR calc Af Amer: >60 mL/min (ref >60)
GFR calc non Af Amer: >60 mL/min (ref >60)
Glucose, Bld: 100 mg/dL — ABNORMAL HIGH (ref 70–99)
Potassium: 3.5 mmol/L (ref 3.5–5.1)
Sodium: 136 mmol/L (ref 135–145)
Total Bilirubin: 0.9 mg/dL (ref 0.3–1.2)
Total Protein: 6.4 g/dL — ABNORMAL LOW (ref 6.5–8.1)

## 2019-08-10 LAB — CBC
HCT: 42.8 % (ref 39.0–52.0)
Hemoglobin: 13.8 g/dL (ref 13.0–17.0)
MCH: 29.2 pg (ref 26.0–34.0)
MCHC: 32.2 g/dL (ref 30.0–36.0)
MCV: 90.5 fL (ref 80.0–100.0)
Platelets: 187 10*3/uL (ref 150–400)
RBC: 4.73 MIL/uL (ref 4.22–5.81)
RDW: 12.7 % (ref 11.5–15.5)
WBC: 10.5 10*3/uL (ref 4.0–10.5)
nRBC: 0 % (ref 0.0–0.2)

## 2019-08-10 LAB — SARS CORONAVIRUS 2 (TAT 6-24 HRS): SARS Coronavirus 2: NEGATIVE

## 2019-08-10 MED ORDER — HYDROMORPHONE HCL 1 MG/ML IJ SOLN
1.0000 mg | INTRAMUSCULAR | Status: DC | PRN
  Administered 2019-08-10 – 2019-08-11 (×3): 1 mg via INTRAVENOUS
  Filled 2019-08-10 (×3): qty 1

## 2019-08-10 MED ORDER — HYDROMORPHONE HCL 1 MG/ML IJ SOLN: 1.0000 mg | INTRAMUSCULAR | Status: DC | PRN

## 2019-08-10 MED ORDER — GUAIFENESIN ER 600 MG PO TB12
600.0000 mg | ORAL_TABLET | Freq: Two times a day (BID) | ORAL | Status: DC
  Administered 2019-08-10 – 2019-08-13 (×7): 600 mg via ORAL
  Filled 2019-08-10 (×7): qty 1

## 2019-08-10 NOTE — Progress Notes (Signed)
PROGRESS NOTE    Eric Peck  [id number] DOB: June 26, 1976 DOA: 08/09/2019 PCP: Trey Sailors, PA  Brief Narrative:  This 43 y.o. male with past medical history significant for diverticulitis with microperforation in 2016, hypertension, GERD, arthritis who presents complaining of worsening abdominal pain, hematuria and dysuria..  Lower quadrant pain is associated with nausea.  He reports some hematuria.  He was evaluated at urgent care and was diagnosed with UTI and was given antibiotics.  He reports swelling of his testicle.  He reported low-grade fever.  Patient relate that abdominal pain got so severe that he could not stand up straight.  For multiple episode of vomiting, and loose stool watery.  He reports intermittent fever.  He reports pain is sharp, 8 out of 10.  He continues to smoke half a pack of cigarettes per day.  CT renal protocol: Findings were compatible with either focal perforated colitis or diverticulitis of the sigmoid colon.There is circumferential wall thickening of the sigmoid colon compatible with colitis or potentially diverticulitis. There is a soft tissue outpouching along the margin of the colon at the site of focal perforation. There is gas within the adjacent colonic mesentery extending cranially within the abdomen.  Assessment & Plan:   Principal Problem:   Colitis (no diverticulosis) Active Problems:   Tobacco abuse   Hypertension   GERD (gastroesophageal reflux disease)   Leukocytosis   1-Acute sigmoid diverticulitis versus colitis with perforation;  Surgical consult appreciated. - IV antibiotics and pain control - N.p.o. status, D5 IV fluids. -Continue with IV Zosyn, pharmacy to dose.  2-Hypertension: He has not been taking blood pressure medication, he report anxiety after taking blood pressure medication. Monitor for now, blood pressure in the 130 range.  3-GERD: IV Protonix twice daily.  4-Current smoker: Counseling provided.    5-Leukocytosis; related to number one.    DVT prophylaxis: Lovenox Code Status: Full code Family Communication: Care discussed with patient Disposition Plan:  Possible discharge home if patient tolerates diet and remains free of nausea vomiting and abdominal pain.  Consultants:   General surgery  Procedures:  Antimicrobials:  Anti-infectives (From admission, onward)   Start     Dose/Rate Route Frequency Ordered Stop   08/09/19 2000  piperacillin-tazobactam (ZOSYN) IVPB 3.375 g     3.375 g 12.5 mL/hr over 240 Minutes Intravenous Every 8 hours 08/09/19 1606     08/09/19 1315  piperacillin-tazobactam (ZOSYN) IVPB 3.375 g     3.375 g 100 mL/hr over 30 Minutes Intravenous  Once 08/09/19 1311 08/09/19 1434     Subjective: Patient was seen and examined at bedside,  he still complains of abdominal pain, mild nausea.  Objective: Vitals:   08/09/19 1500 08/09/19 1522 08/09/19 2142 08/10/19 0619  BP: 123/73 125/73 130/79 127/75  Pulse: 71 71 70 73  Resp: (!) 24 16 16 18   Temp:  98.6 F (37 C) 98.9 F (37.2 C) 98.9 F (37.2 C)  TempSrc:  Oral Oral Oral  SpO2: 97% 96% 97% 95%  Weight:      Height:        Intake/Output Summary (Last 24 hours) at 08/10/2019 1736 Last data filed at 08/10/2019 0800 Gross per 24 hour  Intake 69.22 ml  Output 100 ml  Net -30.78 ml   Filed Weights   08/09/19 1457  Weight: 98.4 kg    Examination:  General exam: Appears calm and comfortable  Respiratory system: Clear to auscultation. Respiratory effort normal. Cardiovascular system: S1 & S2 heard, RRR.  No JVD, murmurs, rubs, gallops or clicks. No pedal edema. Gastrointestinal system: Abdomen is mildly distended, soft and  Mildly tender. No organomegaly or masses felt. Normal bowel sounds heard. Central nervous system: Alert and oriented. No focal neurological deficits. Extremities: Symmetric 5 x 5 power. Skin: No rashes, lesions or ulcers Psychiatry: Judgement and insight appear normal.  Mood & affect appropriate.   Data Reviewed: I have personally reviewed following labs and imaging studies  CBC: Recent Labs  Lab 08/09/19 1139 08/10/19 0517  WBC 17.9* 10.5  HGB 16.9 13.8  HCT 51.3 42.8  MCV 89.5 90.5  PLT 251 [id number]   Basic Metabolic Panel: Recent Labs  Lab 08/09/19 1139 08/10/19 0517  NA 139 136  K 3.9 3.5  CL 104 106  CO2 25 22  GLUCOSE 122* 100*  BUN 17 18  CREATININE 1.06 0.95  CALCIUM 9.2 8.0*   GFR: Estimated Creatinine Clearance: 124.3 mL/min (by C-G formula based on SCr of 0.95 mg/dL). Liver Function Tests: Recent Labs  Lab 08/09/19 1139 08/10/19 0517  AST 21 14*  ALT 23 17  ALKPHOS 59 47  BILITOT 0.6 0.9  PROT 7.7 6.4*  ALBUMIN 4.4 3.6   Recent Labs  Lab 08/09/19 1139  LIPASE 25   No results for input(s): AMMONIA in the last 168 hours. Coagulation Profile: No results for input(s): INR, PROTIME in the last 168 hours. Cardiac Enzymes: No results for input(s): CKTOTAL, CKMB, CKMBINDEX, TROPONINI in the last 168 hours. BNP (last 3 results) No results for input(s): PROBNP in the last 8760 hours. HbA1C: No results for input(s): HGBA1C in the last 72 hours. CBG: No results for input(s): GLUCAP in the last 168 hours. Lipid Profile: No results for input(s): CHOL, HDL, LDLCALC, TRIG, CHOLHDL, LDLDIRECT in the last 72 hours. Thyroid Function Tests: No results for input(s): TSH, T4TOTAL, FREET4, T3FREE, THYROIDAB in the last 72 hours. Anemia Panel: No results for input(s): VITAMINB12, FOLATE, FERRITIN, TIBC, IRON, RETICCTPCT in the last 72 hours. Sepsis Labs: Recent Labs  Lab 08/09/19 1312 08/09/19 1550  LATICACIDVEN 1.5 1.6    Recent Results (from the past 240 hour(s))  SARS CORONAVIRUS 2 (TAT 6-24 HRS) Nasopharyngeal Nasopharyngeal Swab     Status: None   Collection Time: 08/09/19  1:39 PM   Specimen: Nasopharyngeal Swab  Result Value Ref Range Status   SARS Coronavirus 2 NEGATIVE NEGATIVE Final    Comment:  (NOTE) SARS-CoV-2 target nucleic acids are NOT DETECTED. The SARS-CoV-2 RNA is generally detectable in upper and lower respiratory specimens during the acute phase of infection. Negative results do not preclude SARS-CoV-2 infection, do not rule out co-infections with other pathogens, and should not be used as the sole basis for treatment or other patient management decisions. Negative results must be combined with clinical observations, patient history, and epidemiological information. The expected result is Negative. Fact Sheet for Patients: SugarRoll.be Fact Sheet for Healthcare Providers: https://www.woods-mathews.com/ This test is not yet approved or cleared by the [place] FDA and  has been authorized for detection and/or diagnosis of SARS-CoV-2 by FDA under an Emergency Use Authorization (EUA). This EUA will remain  in effect (meaning this test can be used) for the duration of the COVID-19 declaration under Section 56 4(b)(1) of the Act, 21 U.S.C. section 360bbb-3(b)(1), unless the authorization is terminated or revoked sooner. Performed at [facility], [place] [address], [place], [place] [address]   Blood culture (routine x 2)     Status: None (Preliminary result)  Collection Time: 08/09/19  3:34 PM   Specimen: BLOOD  Result Value Ref Range Status   Specimen Description   Final    BLOOD RIGHT ANTECUBITAL Performed at [facility] [address], [place], [place] [address]    Special Requests   Final    BOTTLES DRAWN AEROBIC AND ANAEROBIC Blood Culture adequate volume Performed at [facility] [address] [place], [place] [address]    Culture   Final    NO GROWTH < 12 HOURS Performed at [facility] [address], [place], [place] [address]    Report Status PENDING  Incomplete  Blood culture (routine x 2)     Status: None (Preliminary result)   Collection Time:  08/09/19  3:41 PM   Specimen: BLOOD  Result Value Ref Range Status   Specimen Description   Final    BLOOD BLOOD LEFT HAND Performed at [facility] [address], [place], [place] [address]    Special Requests   Final    BOTTLES DRAWN AEROBIC ONLY Blood Culture adequate volume   Culture   Final    NO GROWTH < 12 HOURS Performed at [facility], [place] [address], [place], [place] [address]    Report Status PENDING  Incomplete      Radiology Studies: Ct Renal Stone Study  Result Date: 08/09/2019 CLINICAL DATA:  Abdominal pain. History of diverticulitis. Flank pain. EXAM: CT ABDOMEN AND PELVIS WITHOUT CONTRAST TECHNIQUE: Multidetector CT imaging of the abdomen and pelvis was performed following the standard protocol without IV contrast. COMPARISON:  CT abdomen pelvis 10/30/2016 FINDINGS: Lower chest: Normal heart size. Dependent atelectasis within the left lower lobe. No pleural effusion. Hepatobiliary: Liver is normal in size and contour. Gallbladder is unremarkable. Pancreas: Unremarkable Spleen: Unremarkable Adrenals/Urinary Tract: Normal adrenal glands. Kidneys are symmetric in size. No hydronephrosis. Urinary bladder is unremarkable. Stomach/Bowel: Circumferential wall thickening of the sigmoid colon (image 75; series 2). Focal outpouching of soft tissue along the margin of the colon (image 74; series 2). There is surrounding mesenteric fat stranding as well as gas within the mesentery coursing with in the mid aspect of the abdomen compatible with perforation. No evidence for upstream bowel obstruction. Normal morphology of the stomach. Vascular/Lymphatic: Normal caliber abdominal aorta. No retroperitoneal lymphadenopathy. Reproductive: Prostate is unremarkable. Other: None. Musculoskeletal: No aggressive or acute appearing osseous lesions. IMPRESSION: Overall findings are compatible with either focal perforated colitis or diverticulitis of the sigmoid colon. There is  circumferential wall thickening of the sigmoid colon compatible with colitis or potentially diverticulitis. There is a soft tissue outpouching along the margin of the colon at the site of focal perforation. There is gas within the adjacent colonic mesentery extending cranially within the abdomen. Critical Value/emergent results were called by telephone at the time of interpretation on 08/09/2019 at 1:11 pm to [place] , who verbally acknowledged these results. Electronically Signed   By: Lovey Newcomer M.D.   On: 08/09/2019 13:13        Scheduled Meds:  enoxaparin (LOVENOX) injection  40 mg Subcutaneous Q24H   guaiFENesin  600 mg Oral BID   pantoprazole (PROTONIX) IV  40 mg Intravenous Q12H   Continuous Infusions:  dextrose 5 % and 0.9% NaCl 1,000 mL (08/10/19 1043)   piperacillin-tazobactam (ZOSYN)  IV 3.375 g (08/10/19 1130)     LOS: 1 day    Time spent:     Shawna Clamp, MD [facility] Pager [phone]  [phone]  If 7PM-7AM, please contact night-coverage [email or web address] Password [facility] 08/10/2019, 5:36 PM

## 2019-08-10 NOTE — Progress Notes (Addendum)
Assessment & Plan: Acute colitis / diverticulitis with probable microperforation, small air in mesentery - IV Zosyn - allow clear liquids today - pain control - WBC improved to 10K this AM  Will follow.  Dr. Marcello Moores notified of admission.        Armandina Gemma, MD       [facility]       Office: [phone]   Chief Complaint: Acute colitis / diverticulitis with microperforation  Subjective: Patient in bed, loose BM with flatus this AM.  Moderate pain.  Objective: Vital signs in last 24 hours: Temp:  [98.6 F (37 C)-98.9 F (37.2 C)] 98.9 F (37.2 C) (11/08 0619) Pulse Rate:  [62-79] 73 (11/08 0619) Resp:  [13-24] 18 (11/08 0619) BP: (123-145)/(69-121) 127/75 (11/08 0619) SpO2:  [95 %-100 %] 95 % (11/08 0619) Weight:  [98.4 kg] 98.4 kg (11/07 1457) Last BM Date: 08/10/19(black per pt)  Intake/Output from previous day: 11/07 0701 - 11/08 0700 In: 69.2 [I.V.:0.5; IV Piggyback:68.7] Out: -  Intake/Output this shift: Total I/O In: 0  Out: 100 [Urine:100]  Physical Exam: HEENT - sclerae clear, mucous membranes moist Neck - soft Abdomen - softer, non-distended; tender lower abdomen, left>right Ext - no edema, non-tender Neuro - alert & oriented, no focal deficits  Lab Results:  Recent Labs    08/09/19 1139 08/10/19 0517  WBC 17.9* 10.5  HGB 16.9 13.8  HCT 51.3 42.8  PLT 251 187   BMET Recent Labs    08/09/19 1139 08/10/19 0517  NA 139 136  K 3.9 3.5  CL 104 106  CO2 25 22  GLUCOSE 122* 100*  BUN 17 18  CREATININE 1.06 0.95  CALCIUM 9.2 8.0*   PT/INR No results for input(s): LABPROT, INR in the last 72 hours. Comprehensive Metabolic Panel:    Component Value Date/Time   NA 136 08/10/2019 0517   NA 139 08/09/2019 1139   NA 141 02/23/2013 2133   NA 138 08/06/2012 0858   K 3.5 08/10/2019 0517   K 3.9 08/09/2019 1139   K 3.5 02/23/2013 2133   K 4.4 08/06/2012 0858   CL 106 08/10/2019 0517   CL 104 08/09/2019 1139   CL  109 (H) 02/23/2013 2133   CL 106 08/06/2012 0858   CO2 22 08/10/2019 0517   CO2 25 08/09/2019 1139   CO2 29 02/23/2013 2133   CO2 26 08/06/2012 0858   BUN 18 08/10/2019 0517   BUN 17 08/09/2019 1139   BUN 11 02/23/2013 2133   BUN 11 08/06/2012 0858   CREATININE 0.95 08/10/2019 0517   CREATININE 1.06 08/09/2019 1139   CREATININE 0.96 02/23/2013 2133   CREATININE 0.76 08/06/2012 0858   GLUCOSE 100 (H) 08/10/2019 0517   GLUCOSE 122 (H) 08/09/2019 1139   GLUCOSE 75 02/23/2013 2133   GLUCOSE 99 08/06/2012 0858   CALCIUM 8.0 (L) 08/10/2019 0517   CALCIUM 9.2 08/09/2019 1139   CALCIUM 8.6 02/23/2013 2133   CALCIUM 9.0 08/06/2012 0858   AST 14 (L) 08/10/2019 0517   AST 21 08/09/2019 1139   AST 21 02/23/2013 2133   ALT 17 08/10/2019 0517   ALT 23 08/09/2019 1139   ALT 21 02/23/2013 2133   ALKPHOS 47 08/10/2019 0517   ALKPHOS 59 08/09/2019 1139   ALKPHOS 61 02/23/2013 2133   BILITOT 0.9 08/10/2019 0517   BILITOT 0.6 08/09/2019 1139   BILITOT 0.2 02/23/2013 2133   PROT 6.4 (L) 08/10/2019 0517   PROT 7.7  08/09/2019 1139   PROT 7.0 02/23/2013 2133   ALBUMIN 3.6 08/10/2019 0517   ALBUMIN 4.4 08/09/2019 1139   ALBUMIN 3.7 02/23/2013 2133    Studies/Results: Ct Renal Stone Study  Result Date: 08/09/2019 CLINICAL DATA:  Abdominal pain. History of diverticulitis. Flank pain. EXAM: CT ABDOMEN AND PELVIS WITHOUT CONTRAST TECHNIQUE: Multidetector CT imaging of the abdomen and pelvis was performed following the standard protocol without IV contrast. COMPARISON:  CT abdomen pelvis 10/30/2016 FINDINGS: Lower chest: Normal heart size. Dependent atelectasis within the left lower lobe. No pleural effusion. Hepatobiliary: Liver is normal in size and contour. Gallbladder is unremarkable. Pancreas: Unremarkable Spleen: Unremarkable Adrenals/Urinary Tract: Normal adrenal glands. Kidneys are symmetric in size. No hydronephrosis. Urinary bladder is unremarkable. Stomach/Bowel: Circumferential wall  thickening of the sigmoid colon (image 75; series 2). Focal outpouching of soft tissue along the margin of the colon (image 74; series 2). There is surrounding mesenteric fat stranding as well as gas within the mesentery coursing with in the mid aspect of the abdomen compatible with perforation. No evidence for upstream bowel obstruction. Normal morphology of the stomach. Vascular/Lymphatic: Normal caliber abdominal aorta. No retroperitoneal lymphadenopathy. Reproductive: Prostate is unremarkable. Other: None. Musculoskeletal: No aggressive or acute appearing osseous lesions. IMPRESSION: Overall findings are compatible with either focal perforated colitis or diverticulitis of the sigmoid colon. There is circumferential wall thickening of the sigmoid colon compatible with colitis or potentially diverticulitis. There is a soft tissue outpouching along the margin of the colon at the site of focal perforation. There is gas within the adjacent colonic mesentery extending cranially within the abdomen. Critical Value/emergent results were called by telephone at the time of interpretation on 08/09/2019 at 1:11 pm to [place] , who verbally acknowledged these results. Electronically Signed   By: Lovey Newcomer M.D.   On: 08/09/2019 13:13      Armandina Gemma 08/10/2019  Patient ID: Eric Peck, male   DOB: 02/03/1976, 43 y.o.   MRN: [id number]

## 2019-08-11 DIAGNOSIS — K5720 Diverticulitis of large intestine with perforation and abscess without bleeding: Principal | ICD-10-CM

## 2019-08-11 DIAGNOSIS — R935 Abnormal findings on diagnostic imaging of other abdominal regions, including retroperitoneum: Secondary | ICD-10-CM

## 2019-08-11 DIAGNOSIS — R1030 Lower abdominal pain, unspecified: Secondary | ICD-10-CM

## 2019-08-11 DIAGNOSIS — K529 Noninfective gastroenteritis and colitis, unspecified: Secondary | ICD-10-CM

## 2019-08-11 LAB — COMPREHENSIVE METABOLIC PANEL
ALT: 17 U/L (ref 0–44)
AST: 16 U/L (ref 15–41)
Albumin: 3.5 g/dL (ref 3.5–5.0)
Alkaline Phosphatase: 45 U/L (ref 38–126)
Anion gap: 9 (ref 5–15)
BUN: 13 mg/dL (ref 6–20)
CO2: 25 mmol/L (ref 22–32)
Calcium: 8.2 mg/dL — ABNORMAL LOW (ref 8.9–10.3)
Chloride: 104 mmol/L (ref 98–111)
Creatinine, Ser: 0.97 mg/dL (ref 0.61–1.24)
GFR calc Af Amer: >60 mL/min (ref >60)
GFR calc non Af Amer: >60 mL/min (ref >60)
Glucose, Bld: 95 mg/dL (ref 70–99)
Potassium: 3.3 mmol/L — ABNORMAL LOW (ref 3.5–5.1)
Sodium: 138 mmol/L (ref 135–145)
Total Bilirubin: 0.7 mg/dL (ref 0.3–1.2)
Total Protein: 6.5 g/dL (ref 6.5–8.1)

## 2019-08-11 LAB — CBC
HCT: 42.9 % (ref 39.0–52.0)
Hemoglobin: 13.9 g/dL (ref 13.0–17.0)
MCH: 29.4 pg (ref 26.0–34.0)
MCHC: 32.4 g/dL (ref 30.0–36.0)
MCV: 90.7 fL (ref 80.0–100.0)
Platelets: 173 10*3/uL (ref 150–400)
RBC: 4.73 MIL/uL (ref 4.22–5.81)
RDW: 12.6 % (ref 11.5–15.5)
WBC: 7.2 10*3/uL (ref 4.0–10.5)
nRBC: 0 % (ref 0.0–0.2)

## 2019-08-11 LAB — PHOSPHORUS: Phosphorus: 2.6 mg/dL (ref 2.5–4.6)

## 2019-08-11 LAB — MAGNESIUM
Magnesium: 2 mg/dL (ref 1.7–2.4)
Magnesium: 2.1 mg/dL (ref 1.7–2.4)

## 2019-08-11 MED ORDER — POTASSIUM CHLORIDE 20 MEQ PO PACK
40.0000 meq | PACK | Freq: Two times a day (BID) | ORAL | Status: DC
  Administered 2019-08-11 (×2): 40 meq via ORAL
  Filled 2019-08-11 (×3): qty 2

## 2019-08-11 MED ORDER — ENSURE ENLIVE PO LIQD
237.0000 mL | Freq: Two times a day (BID) | ORAL | Status: DC
  Administered 2019-08-12 – 2019-08-13 (×3): 237 mL via ORAL

## 2019-08-11 MED ORDER — KCL IN DEXTROSE-NACL 40-5-0.9 MEQ/L-%-% IV SOLN
INTRAVENOUS | Status: DC
  Administered 2019-08-11 – 2019-08-12 (×2): via INTRAVENOUS
  Filled 2019-08-11 (×3): qty 1000

## 2019-08-11 MED ORDER — OXYCODONE HCL 5 MG PO TABS
5.0000 mg | ORAL_TABLET | ORAL | Status: DC | PRN
  Administered 2019-08-11 – 2019-08-12 (×5): 10 mg via ORAL
  Filled 2019-08-11 (×6): qty 2

## 2019-08-11 MED ORDER — HYDROMORPHONE HCL 1 MG/ML IJ SOLN
0.5000 mg | INTRAMUSCULAR | Status: DC | PRN
  Administered 2019-08-11 (×3): 1 mg via INTRAVENOUS
  Filled 2019-08-11 (×4): qty 1

## 2019-08-11 MED ORDER — PANTOPRAZOLE SODIUM 40 MG PO TBEC
40.0000 mg | DELAYED_RELEASE_TABLET | Freq: Two times a day (BID) | ORAL | Status: DC
  Administered 2019-08-11 – 2019-08-13 (×4): 40 mg via ORAL
  Filled 2019-08-11 (×4): qty 1

## 2019-08-11 MED ORDER — ACETAMINOPHEN 500 MG PO TABS
1000.0000 mg | ORAL_TABLET | Freq: Three times a day (TID) | ORAL | Status: DC
  Administered 2019-08-11 – 2019-08-13 (×6): 1000 mg via ORAL
  Filled 2019-08-11 (×5): qty 2

## 2019-08-11 NOTE — Consult Note (Addendum)
[facility] Consult: 12:42 PM 08/11/2019  LOS: 2 days    Referring Provider: Dr Harlow Asa  Primary Care Physician:  Trey Sailors, [place] Primary Gastroenterologist:  Dr Carlean Purl     Reason for Consultation: Colitis?   HPI: Eric Peck is a 43 y.o. male.   Abdominal pain dating back a few years.  For the most part it has been on the right side.  Has this on a daily basis.  Stools are soft but not bloody.  Also has chronic daily nausea in the morning spite daily Protonix.  Does not throw up very often.  In 03/2015 he was treated for small localized perforation in an area of sigmoid colitis vs diverticuliltis.  Treated with bowel rest, antibiotics.  Since then that he has had the chronic GI issues.  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   He has been evaluated as follows. 04/23/2015 colonoscopy. By Dr. [name].  For follow-up of previous Dx diverticulitis:  Circumferential, diffuse abnormal mucosa in the sigmoid.  Mucosa edematous, congested.  This area was not biopsied.   08/16/2016 EGD for evaluation of chronic RUQ abdominal pain.  Erosive gastropathy in the prepylorus..  Otherwise normal study. 04/26/2016 HIDA scan.  Normal study, normal EF.  Patent cystic and CB ducts. 10/30/2016 ultrasound abdomen: Thickened GB wall to 6 mm but no cholelithiasis or Murphy sign. ?  Chronic cholecystitis.  Bile ducts normal. 10/30/2016 CTAP with contrast.  No acute process to explain right lower quadrant pain.  Nonspecific, submucosal fat prominence throughout the colon.  Suspicion for GB wall thickening.  On Thursday he developed acute worsening of the right lower quadrant pain it radiated across to the  left lower quadrant and was severe.  He had nausea with some vomiting.  Stools were watery, dark.  He saw blood in his urine.  He went to urgent care on Thursday and was diagnosed with UTI, treated with Cipro.  The symptoms continued bad and he presented to the ED on Friday and was admitted.  Some fever.  No chills or sweats. In the last year weight has dropped from 240 to 217#.  He eats one larger meal a day and in the last year or so the amount he eats at that meal is less than what he used to eat.  Renal stone study, noncontrasted CT scan: ?  Focal perforated colitis or diverticulitis in the sigmoid colon.  Circumferential wall thickening in the sigmoid colon, colitis vs diverticulitis.  Gas in the mesentery adjacent to this area. WBCs 17.9 >> 7.2.  Hb 13.9. C-Met unremarkable other than potassium 3.3. Day 3 Zosyn.    Pain improved.  Diet has been advanced to full liquids and there is a possibility, if he tolerates further advancement of his diet and pain is controlled, that he may discharge home tomorrow.  Smokes 10 cigarettes a day.  Smokes pot ~ 5 times a week.  No  EtOH. 2 cousins with Crohn's disease, 1 paternal 1 maternal.  Maternal grandmother with nonspecific colitis.    Past Medical History:  Diagnosis Date  . Anxiety   . Arthritis   . CTS (carpal tunnel syndrome)   . Diverticulitis of large intestine with perforation 03/2015   microperf of sigmoid colitis, tx with abx, no surg  . GERD (gastroesophageal reflux disease)   . Hypertension 11/06/2016    Past Surgical History:  Procedure Laterality Date  . COLONOSCOPY N/A 04/23/2015   Procedure: DIAGNOSTIC COLONOSCOPY;  Surgeon: [name], MD;  Location: [facility];  Service: Endoscopy;  Laterality: N/A;  . VASECTOMY      Prior to Admission medications   Medication Sig Start Date End Date Taking? Authorizing Provider  ciprofloxacin (CIPRO) 500 MG tablet Take 500 mg by mouth every 12 (twelve) hours.   Yes [provider]  ibuprofen (ADVIL,MOTRIN) 200 MG tablet Take 400-800 mg by mouth every 6 (six) hours as needed for headache or mild pain. Reported on 04/12/2016   Yes [provider]  metroNIDAZOLE (FLAGYL) 500 MG tablet Take 500 mg by mouth 3 (three) times daily.   Yes [provider]  phenazopyridine (PYRIDIUM) 200 MG tablet Take 200 mg by mouth 3 (three) times daily as needed for pain.   Yes [provider]  promethazine (PHENERGAN) 25 MG tablet Take 25 mg by mouth every 6 (six) hours as needed for nausea or vomiting.   Yes [provider]  amitriptyline (ELAVIL) 25 MG tablet Take 1 tablet (25 mg total) by mouth at bedtime as needed for sleep. Patient not taking: Reported on 08/09/2019 12/04/16   Maren Reamer, MD  diclofenac (VOLTAREN) 75 MG EC tablet Take 1 tablet (75 mg total) by mouth 2 (two) times daily. Patient not taking: Reported on 08/09/2019 01/22/18   Vanessa Kick, MD  escitalopram (LEXAPRO) 20 MG tablet Take 1 tablet (20 mg total) by mouth daily. Patient not taking: Reported on 08/09/2019 12/04/16   Maren Reamer, MD  fluticasone [facility]) 50 MCG/ACT nasal spray Place 2 sprays into both nostrils daily. Patient not taking: Reported on 08/09/2019 12/04/16   Maren Reamer, MD  gabapentin (NEURONTIN) 300 MG capsule Take 1 capsule (300 mg total) by mouth at bedtime. Patient not taking: Reported on 08/09/2019 11/06/16   Maren Reamer, MD  HYDROcodone-acetaminophen (NORCO/VICODIN) 5-325 MG tablet Take 1 tablet by mouth every 6 (six) hours as needed for moderate pain or severe pain. Patient not taking: Reported on 08/09/2019 01/22/18   Vanessa Kick, MD  ondansetron (ZOFRAN) 4 MG tablet Take 1 tablet (4 mg total) by mouth every 6 (six) hours as needed for nausea or vomiting. Patient not taking: Reported on 08/09/2019 10/24/16   Maren Reamer, MD  oxyCODONE-acetaminophen (PERCOCET/ROXICET) 5-325 MG tablet Take 1 tablet by mouth every 6 (six) hours as needed for  severe pain. Patient not taking: Reported on 08/09/2019 04/29/18   Hedges, Dellis Filbert, PA-C  pantoprazole (PROTONIX) 40 MG tablet Take 1 tablet (40 mg total) by mouth daily. Patient not taking: Reported on 08/09/2019 08/29/16   Lottie Mussel T, MD  pravastatin (PRAVACHOL) 20 MG tablet Take 1 tablet (20 mg total) by mouth daily. Patient not taking: Reported on 10/30/2016 08/30/16   Lottie Mussel T, MD    Scheduled Meds: . acetaminophen  1,000 mg Oral Q8H  . enoxaparin (LOVENOX) injection  40 mg Subcutaneous Q24H  . guaiFENesin  600 mg Oral BID  . pantoprazole (PROTONIX) IV  40 mg Intravenous Q12H  . potassium chloride  40 mEq Oral BID   Infusions: . dextrose 5 % and 0.9 % NaCl with KCl 40 mEq/L 75 mL/hr at 08/11/19 0948  . piperacillin-tazobactam (ZOSYN)  IV 3.375 g (08/11/19 1138)   PRN Meds: HYDROmorphone, ketorolac, ondansetron **OR** ondansetron (ZOFRAN) IV, oxyCODONE   Allergies as of 08/09/2019  . (No Known Allergies)    Family History  Problem Relation Age of Onset  . COPD Mother   . Drug abuse Mother        EtOH  . Inflammatory bowel disease Mother   . Cystic fibrosis Mother   . Irritable bowel syndrome Mother   . Diabetes Father   . Kidney disease Father   . Eczema Brother   . Irritable bowel syndrome Brother   . Mental retardation Brother        Down's Syndrome  . Breast cancer Maternal Grandmother   . Colitis Maternal Grandmother   . Breast cancer Paternal Grandmother   . Prostate cancer Paternal Uncle        x 2  . Crohn's disease Cousin        x 2    Social History   Socioeconomic History  . Marital status: Married    Spouse name: Not on file  . Number of children: Not on file  . Years of education: Not on file  . Highest education level: Not on file  Occupational History  . Not on file  Social Needs  . Financial resource strain: Not on file  . Food insecurity    Worry: Not on file    Inability: Not on file  . Transportation needs    Medical:  Not on file    Non-medical: Not on file  Tobacco Use  . Smoking status: Current Every Day Smoker    Packs/day: 0.50    Types: Cigarettes  . Smokeless tobacco: Never Used  . Tobacco comment: form given 08-02-16  Substance and Sexual Activity  . Alcohol use: No  . Drug use: Yes    Frequency: 5.0 times per week    Types: Marijuana    Comment: or more  . Sexual activity: Not on file  Lifestyle  . Physical activity    Days per week: Not on file    Minutes per session: Not on file  . Stress: Not on file  Relationships  . Social Herbalist on phone: Not on file    Gets together: Not on file    Attends religious service: Not on file    Active member of club or organization: Not on file    Attends meetings of clubs or organizations: Not on file    Relationship status: Not on file  . Intimate partner violence    Fear of current or ex partner: Not on file    Emotionally abused: Not on file    Physically abused: Not on file    Forced sexual activity: Not on file  Other Topics Concern  . Not on file  Social History Narrative   Married 1 son a1 daughter   Unemployed carpenter   Smoker   4 caffeine/day   Frequent marijuana   08/02/2016    REVIEW OF SYSTEMS: Constitutional: No fatigue or weakness ENT:  No nose bleeds Pulm: No shortness of breath.  No cough. CV:  No palpitations, no LE edema.  GU:  No hematuria, no frequency GI: See HPI.  No dysphagia.  Has been checked  for hepatitis B and C in the past, testing negative. Heme: No unusual bleeding or bruising. Transfusions: None. Neuro:  No headaches, no peripheral tingling or numbness.  No seizures, no syncope. Derm:  No itching, no rash or sores.  Endocrine:  No sweats or chills.  No polyuria or dysuria Immunization: Reviewed.  His last vaccinations are recorded in 2017. Travel:  None beyond local counties in last few months.    PHYSICAL EXAM: Vital signs in last 24 hours: Vitals:   08/10/19 0619 08/11/19 0648   BP: 127/75 136/79  Pulse: 73 63  Resp: 18 17  Temp: 98.9 F (37.2 C) 98.6 F (37 C)  SpO2: 95% 98%   Wt Readings from Last 3 Encounters:  08/09/19 98.4 kg  12/04/16 (!) 153.6 kg  11/06/16 106.8 kg    General: Pleasant, nonill appearing, comfortable. Head: No facial asymmetry or swelling.  No signs of head trauma. Eyes: No scleral icterus.  No conjunctival pallor. Ears: Not hard of hearing Nose: No congestion or discharge. Mouth: Oropharynx moist, pink, clear.  Tongue midline.  Some missing teeth. Neck: No JVD, no masses, no thyromegaly. Lungs: Clear bilaterally.  Some cough but no labored breathing. Heart: RRR.  No MRG.  S1, S2. Abdomen: Nondistended.  Tenderness is in the lower quadrants bilaterally.  No guarding or rebound.  Bowel sounds hyperactive.  No masses, no HSM.Marland Kitchen   Rectal: Deferred. Musc/Skeltl: No joint redness, swelling or gross deformity. Extremities: No CCE. Neurologic: Oriented x3.  No tremors.  No limb weakness. Skin: Excessive sun exposure.  No suspicious lesions. Tattoos: On his back and arms.  The tattoos on his arms were self performed. Nodes: No cervical adenopathy Psych: Calm, pleasant, cooperative.  Intake/Output from previous day: 11/08 0701 - 11/09 0700 In: 669.4 [I.V.:542.2; IV Piggyback:127.1] Out: 360 [Urine:360] Intake/Output this shift: Total I/O In: 240 [P.O.:240] Out: -   LAB RESULTS: Recent Labs    08/09/19 1139 08/10/19 0517 08/11/19 0516  WBC 17.9* 10.5 7.2  HGB 16.9 13.8 13.9  HCT 51.3 42.8 42.9  PLT 251 187 173   BMET Lab Results  Component Value Date   NA 138 08/11/2019   NA 136 08/10/2019   NA 139 08/09/2019   K 3.3 (L) 08/11/2019   K 3.5 08/10/2019   K 3.9 08/09/2019   CL 104 08/11/2019   CL 106 08/10/2019   CL 104 08/09/2019   CO2 25 08/11/2019   CO2 22 08/10/2019   CO2 25 08/09/2019   GLUCOSE 95 08/11/2019   GLUCOSE 100 (H) 08/10/2019   GLUCOSE 122 (H) 08/09/2019   BUN 13 08/11/2019   BUN 18 08/10/2019    BUN 17 08/09/2019   CREATININE 0.97 08/11/2019   CREATININE 0.95 08/10/2019   CREATININE 1.06 08/09/2019   CALCIUM 8.2 (L) 08/11/2019   CALCIUM 8.0 (L) 08/10/2019   CALCIUM 9.2 08/09/2019   LFT Recent Labs    08/09/19 1139 08/10/19 0517 08/11/19 0516  PROT 7.7 6.4* 6.5  ALBUMIN 4.4 3.6 3.5  AST 21 14* 16  ALT '23 17 17  ' ALKPHOS 59 47 45  BILITOT 0.6 0.9 0.7   PT/INR No results found for: INR, PROTIME Hepatitis Panel No results for input(s): HEPBSAG, HCVAB, HEPAIGM, HEPBIGM in the last 72 hours. C-Diff No components found for: CDIFF Lipase     Component Value Date/Time   LIPASE 25 08/09/2019 1139    Drugs of Abuse  No results found for: LABOPIA, COCAINSCRNUR, LABBENZ, AMPHETMU, THCU, LABBARB   RADIOLOGY STUDIES:  Ct Renal Stone Study  Result Date: 08/09/2019 CLINICAL DATA:  Abdominal pain. History of diverticulitis. Flank pain. EXAM: CT ABDOMEN AND PELVIS WITHOUT CONTRAST TECHNIQUE: Multidetector CT imaging of the abdomen and pelvis was performed following the standard protocol without IV contrast. COMPARISON:  CT abdomen pelvis 10/30/2016 FINDINGS: Lower chest: Normal heart size. Dependent atelectasis within the left lower lobe. No pleural effusion. Hepatobiliary: Liver is normal in size and contour. Gallbladder is unremarkable. Pancreas: Unremarkable Spleen: Unremarkable Adrenals/Urinary Tract: Normal adrenal glands. Kidneys are symmetric in size. No hydronephrosis. Urinary bladder is unremarkable. Stomach/Bowel: Circumferential wall thickening of the sigmoid colon (image 75; series 2). Focal outpouching of soft tissue along the margin of the colon (image 74; series 2). There is surrounding mesenteric fat stranding as well as gas within the mesentery coursing with in the mid aspect of the abdomen compatible with perforation. No evidence for upstream bowel obstruction. Normal morphology of the stomach. Vascular/Lymphatic: Normal caliber abdominal aorta. No retroperitoneal  lymphadenopathy. Reproductive: Prostate is unremarkable. Other: None. Musculoskeletal: No aggressive or acute appearing osseous lesions. IMPRESSION: Overall findings are compatible with either focal perforated colitis or diverticulitis of the sigmoid colon. There is circumferential wall thickening of the sigmoid colon compatible with colitis or potentially diverticulitis. There is a soft tissue outpouching along the margin of the colon at the site of focal perforation. There is gas within the adjacent colonic mesentery extending cranially within the abdomen. Critical Value/emergent results were called by telephone at the time of interpretation on 08/09/2019 at 1:11 pm to [place] , who verbally acknowledged these results. Electronically Signed   By: Lovey Newcomer M.D.   On: 08/09/2019 13:13      IMPRESSION:   *    Focal perforated sigmoid colitis vs diverticulitis.  Same happedned 2016.  Colonoscopy without biopsies post recovery 2016 showing edematous, congested sigmoid colon.   PLAN:     *     Patient needs repeat colonoscopy with biopsy in several weeks once this process has been given time to heal. Continue antibiotics 7 to 10 days total.  *    Dr. Henrene Pastor will round on the patient while he is here inpatient but at discharge will be followed by Dr. Ardelle Balls  08/11/2019, 12:42 PM Phone [phone]  GI ATTENDING  History, laboratories, x-rays, prior endoscopy reports reviewed.  Agree with comprehensive consultation note as outlined above.  Patient with contained perforated diverticulitis versus colitis.  Similar problem 4 years ago.  At this point, he needs ongoing aggressive antibiotic therapy.  He will need follow-up imaging at some point.  Thereafter, repeat colonoscopy would be reasonable to be certain that he does not have IBD.  If not, an argument could be made for segmental resection since he has had 2 episodes of perforated diverticulitis.  I will see him  tomorrow.  Docia Chuck. Geri Seminole., M.D. [facility] of Gastroenterology

## 2019-08-11 NOTE — Progress Notes (Addendum)
CC: Abdominal pain  Subjective: Patient says abdominal pain is better this a.m.  He says last time his pain was on the right side.  This time is predominantly on the left side this time.  It is still present but markedly improved.  He says the Toradol gives him his hot flashes so we will discontinue that.  He is having soft bowel movements and has had soft bowel movements since his episode in 2016.  Objective: Vital signs in last 24 hours: Temp:  [98.6 F (37 C)] 98.6 F (37 C) (11/09 0648) Pulse Rate:  [63] 63 (11/09 0648) Resp:  [17] 17 (11/09 0648) BP: (136)/(79) 136/79 (11/09 0648) SpO2:  [98 %] 98 % (11/09 0648) Last BM Date: 08/10/19(black per pt) Pain control Dilaudid x8; Toradol x3 Nothing p.o. recorded 669 IV recorded 360 urine recorded No BM recorded Patient is afebrile vital signs are stable K+ 3.2 Remainder of CMP is stable WBC 7.2 H/H 13.9/42.9 Platelets 170 3K CT renal stone study 11/7: Circumferential wall thickening of the sigmoid colon focal outpouching soft tissue margins: There is surrounding mesenteric fat stranding as well as gas within the mesentery coursing within the mid aspect of the abdomen compatible with perforation no evidence of upstream bowel obstruction.  Findings consistent with focal perforated colitis versus diverticulitis of the sigmoid colon.  Intake/Output from previous day: 11/08 0701 - 11/09 0700 In: 669.4 [I.V.:542.2; IV Piggyback:127.1] Out: 360 [Urine:360] Intake/Output this shift: No intake/output data recorded.  General appearance: alert, cooperative and no distress Resp: clear to auscultation bilaterally GI: Soft, still sore lower abdomen.  Left side more so than the right or mid abdomen.  Pain is markedly improved.  Tolerating clear liquids and having soft bowel movements.  Lab Results:  Recent Labs    08/10/19 0517 08/11/19 0516  WBC 10.5 7.2  HGB 13.8 13.9  HCT 42.8 42.9  PLT 187 173    BMET Recent Labs   08/10/19 0517 08/11/19 0516  NA 136 138  K 3.5 3.3*  CL 106 104  CO2 22 25  GLUCOSE 100* 95  BUN 18 13  CREATININE 0.95 0.97  CALCIUM 8.0* 8.2*   PT/INR No results for input(s): LABPROT, INR in the last 72 hours.  Recent Labs  Lab 08/09/19 1139 08/10/19 0517 08/11/19 0516  AST 21 14* 16  ALT 23 17 17   ALKPHOS 59 47 45  BILITOT 0.6 0.9 0.7  PROT 7.7 6.4* 6.5  ALBUMIN 4.4 3.6 3.5     Lipase     Component Value Date/Time   LIPASE 25 08/09/2019 1139     Medications: . enoxaparin (LOVENOX) injection  40 mg Subcutaneous Q24H  . guaiFENesin  600 mg Oral BID  . pantoprazole (PROTONIX) IV  40 mg Intravenous Q12H   . dextrose 5 % and 0.9% NaCl Stopped (08/10/19 2014)  . piperacillin-tazobactam (ZOSYN)  IV 3.375 g (08/11/19 0431)   Assessment/Plan Hypertension GERD Tobacco use Hypokalemia -will replace/check mag  Acute colitis/diverticulitis with probable microperforation/sepsis small amount of air in mesentery  -Hx colitis/diverticulitis with microperforation 2016/colonoscopy,  (Dr. Greer Pickerel, 05/23/2015 colitis presumed infectious; no surgical indication at that time.)  - colonoscopy, Dr. [name] [id number]: Circumferential diffuse abnormal mucosa was found in the sigmoid colon; The mucosa was edematous and congested  - EGD 08/18/14, Dr. Silvano Rusk:  - Erosive gastropathy. Biopsied.  The examination was otherwise normal.  FEN: Clear liquids/IV fluids >> full liquids ID: Zosyn 11/7 >> day 3 DVT: Lovenox/SCDs Follow-up:  TBD  Plan: Full liquids and continue IV antibiotics.  I will put him on some maintenance IV fluid, until we know his p.o. intake is adequate and symptoms have resolved.  Discussed with Dr. Marcello Moores, she would recommend GI see again and rule out UC.  I have contacted [facility] and ask them to see also. Recheck labs in a.m.,  Replace K+, DC Toradol, p.o. Tylenol Pt    LOS: 2 days    Eric Peck 08/11/2019 Please see Amion

## 2019-08-11 NOTE — Progress Notes (Signed)
Initial Nutrition Assessment  RD working remotely.  DOCUMENTATION CODES:   Not applicable  INTERVENTION:   - Ensure Enlive po BID, each supplement provides 350 kcal and 20 grams of protein  - Encourage adequate PO intake  NUTRITION DIAGNOSIS:   Increased nutrient needs related to acute illness as evidenced by estimated needs.  GOAL:   Patient will meet greater than or equal to 90% of their needs  MONITOR:   PO intake, Supplement acceptance, Labs, Weight trends  REASON FOR ASSESSMENT:   Malnutrition Screening Tool    ASSESSMENT:   43 year old male who presented to the ED on 11/07 with abdominal pain. PMH of diverticulitis with perforation, GERD, HTN. CT scan showing colitis/diverticulits with contained perforation.   11/08 - clear liquids 11/09 - full liquids, later soft diet  Per GI note, "In the last year weight has dropped from 240 to 217#.  He eats one larger meal a day and in the last year or so the amount he eats at that meal is less than what he used to eat."  Reviewed weight history in chart. Weight history is limited as last available weight PTA is from March 2018 but does show that pt has lost approximately 8 kg since that time. Weight of 217 lbs (98.4 kg) on admission appears stated rather than measured.  Spoke with pt via phone call to room. Pt reports his abdomen does not feel as "crampy" as it did a few days ago. Pt states his stomach is growling some due to just having liquids for the last 4-5 days. Pt reports he is having BMs.  Pt shares that ever since he first "got sick" in 2016, he has not been eating as much as he used to. Pt reports that he doesn't like fast food, so he typically eats 1 meal at the end of the day. During the day, pt snacks on fruits, vegetables, and cakes. Pt's meal at the day consists of approximately 1/4-1/2 lb of food and is mostly protein (chicken, fish, etc.). Pt states that this is way less food than he used to eat because he  experiences early satiety and nausea if he eats much more. Pt reports that he does drink Vitamin Water and "a lot" of [employer] throughout the day. Pt believes this is why he as not lost much weight.  Pt reports his UBW as between 215-230 lbs. Pt states that this has been his weight since he was 43 years old. Pt reports that although he may not have lost much weight, he has definitely lost some muscle as he feels weaker and tired all the time.  Pt amenable to RD ordering Ensure Enlive during admission. Discussed importance of adequate kcal and protein intake in maintaining lean muscle mass. Discussed oral nutrition supplement options that pt may try after d/c, specifically Premier Protein.  Meal Completion: 100% x 1 meal (full liquids)  Medications reviewed and include: Protonix, Klor-con 40 mEq BID, IV abx IVF: D5 and NS with KCl @ 75 ml/hr  Labs reviewed: potassium 3.3  NUTRITION - FOCUSED PHYSICAL EXAM:  Unable to complete at this time. RD working remotely.  Diet Order:   Diet Order            DIET SOFT Room service appropriate? Yes; Fluid consistency: Thin  Diet effective now              EDUCATION NEEDS:   Education needs have been addressed  Skin:  Skin Assessment: Reviewed RN Assessment  Last BM:  08/10/19  Height:   Ht Readings from Last 1 Encounters:  08/09/19 6' (1.829 m)    Weight:   Wt Readings from Last 1 Encounters:  08/09/19 98.4 kg    Ideal Body Weight:  80.9 kg  BMI:  Body mass index is 29.43 kg/m.  Estimated Nutritional Needs:   Kcal:  2200-2400  Protein:  100-120 grams  Fluid:  >/= 2.0 L    Gaynell Face, MS, RD, LDN Inpatient Clinical Dietitian Pager: [phone] Weekend/After Hours: [phone]

## 2019-08-11 NOTE — Progress Notes (Signed)
The patient is receiving Protonix by the intravenous route.  Based on criteria approved by the Pharmacy and [place], the medication is being converted to the equivalent oral dose form.  These criteria include: -No active GI bleeding -Able to tolerate diet of full liquids (or better) or tube feeding -Able to tolerate other medications by the oral or enteral route  If you have any questions about this conversion, please contact the Pharmacy Department (phone [phone]).  Thank you.  Minda Ditto PharmD 08/11/2019, 1:38 PM

## 2019-08-11 NOTE — Progress Notes (Signed)
PROGRESS NOTE    CODA CHOTO  [id number] DOB: 1976/05/06 DOA: 08/09/2019 PCP: Trey Sailors, PA  Brief Narrative:  This 43 y.o. male with past medical history significant for diverticulitis with microperforation in 2016, hypertension, GERD, arthritis who presents complaining of worsening abdominal pain, hematuria and dysuria..  Lower quadrant pain is associated with nausea.  He reports some hematuria.  He was evaluated at urgent care and was diagnosed with UTI and was given antibiotics.  He reports swelling of his testicle.  He reported low-grade fever.  Patient relate that abdominal pain got so severe that he could not stand up straight.  For multiple episode of vomiting, and loose stool watery.  He reports intermittent fever.  He reports pain is sharp, 8 out of 10.  He continues to smoke half a pack of cigarettes per day.  CT renal protocol: Findings were compatible with either focal perforated colitis or diverticulitis of the sigmoid colon.There is circumferential wall thickening of the sigmoid colon compatible with colitis or potentially diverticulitis. There is a soft tissue outpouching along the margin of the colon at the site of focal perforation. There is gas within the adjacent colonic mesentery extending cranially within the abdomen.  Assessment & Plan:   Principal Problem:   Colitis (no diverticulosis) Active Problems:   Tobacco abuse   Hypertension   GERD (gastroesophageal reflux disease)   Leukocytosis   1-Acute sigmoid diverticulitis versus colitis with perforation;  Surgical consult appreciated.  No acute surgical intervention needed at this point -Continue IV antibiotics and pain control -Start clear liquid diet and advance as tolerated -Suggest GI consult to rule out ulcerative colitis. GI consult  -continue ongoing aggressive antibiotic therapy.  He will need follow-up imaging at some point.  2-Hypertension: He has not been taking blood pressure  medication, he report anxiety after taking blood pressure medication. Monitor for now, blood pressure in the 130 range.  3-GERD: IV Protonix twice daily.  4-Current smoker: Counseling provided.    5-Leukocytosis; related to number one.    DVT prophylaxis: Lovenox Code Status: Full code Family Communication: Care discussed with patient Disposition Plan:  Possible discharge home if patient tolerates diet and remains free of nausea vomiting and abdominal pain.  Consultants:   General surgery  GI  Procedures:  Antimicrobials:  Anti-infectives (From admission, onward)   Start     Dose/Rate Route Frequency Ordered Stop   08/09/19 2000  piperacillin-tazobactam (ZOSYN) IVPB 3.375 g     3.375 g 12.5 mL/hr over 240 Minutes Intravenous Every 8 hours 08/09/19 1606     08/09/19 1315  piperacillin-tazobactam (ZOSYN) IVPB 3.375 g     3.375 g 100 mL/hr over 30 Minutes Intravenous  Once 08/09/19 1311 08/09/19 1434     Subjective: Patient was seen and examined at bedside,  he still complains of abdominal pain, mild nausea.  Objective: Vitals:   08/09/19 2142 08/10/19 0619 08/11/19 0648 08/11/19 1544  BP: 130/79 127/75 136/79 117/76  Pulse: 70 73 63 (!) 54  Resp: 16 18 17 18   Temp: 98.9 F (37.2 C) 98.9 F (37.2 C) 98.6 F (37 C) 98.3 F (36.8 C)  TempSrc: Oral Oral Oral Oral  SpO2: 97% 95% 98% 100%  Weight:      Height:        Intake/Output Summary (Last 24 hours) at 08/11/2019 1907 Last data filed at 08/11/2019 1635 Gross per 24 hour  Intake 1619.74 ml  Output -  Net 1619.74 ml   [employer]  08/09/19 1457  Weight: 98.4 kg    Examination:  General exam: Appears calm and comfortable  Respiratory system: Clear to auscultation. Respiratory effort normal. Cardiovascular system: S1 & S2 heard, RRR. No JVD, murmurs, rubs, gallops or clicks. No pedal edema. Gastrointestinal system: Abdomen is mildly distended, soft and  Mildly tender. No organomegaly or masses  felt. Normal bowel sounds heard. Central nervous system: Alert and oriented. No focal neurological deficits. Extremities: Symmetric 5 x 5 power. Skin: No rashes, lesions or ulcers Psychiatry: Judgement and insight appear normal. Mood & affect appropriate.   Data Reviewed: I have personally reviewed following labs and imaging studies  CBC: Recent Labs  Lab 08/09/19 1139 08/10/19 0517 08/11/19 0516  WBC 17.9* 10.5 7.2  HGB 16.9 13.8 13.9  HCT 51.3 42.8 42.9  MCV 89.5 90.5 90.7  PLT 251 187 [id number]   Basic Metabolic Panel: Recent Labs  Lab 08/09/19 1139 08/10/19 0517 08/11/19 0516  NA 139 136 138  K 3.9 3.5 3.3*  CL 104 106 104  CO2 25 22 25   GLUCOSE 122* 100* 95  BUN 17 18 13   CREATININE 1.06 0.95 0.97  CALCIUM 9.2 8.0* 8.2*  MG  --   --  2.1  2.0  PHOS  --   --  2.6   GFR: Estimated Creatinine Clearance: 121.8 mL/min (by C-G formula based on SCr of 0.97 mg/dL). Liver Function Tests: Recent Labs  Lab 08/09/19 1139 08/10/19 0517 08/11/19 0516  AST 21 14* 16  ALT 23 17 17   ALKPHOS 59 47 45  BILITOT 0.6 0.9 0.7  PROT 7.7 6.4* 6.5  ALBUMIN 4.4 3.6 3.5   Recent Labs  Lab 08/09/19 1139  LIPASE 25   No results for input(s): AMMONIA in the last 168 hours. Coagulation Profile: No results for input(s): INR, PROTIME in the last 168 hours. Cardiac Enzymes: No results for input(s): CKTOTAL, CKMB, CKMBINDEX, TROPONINI in the last 168 hours. BNP (last 3 results) No results for input(s): PROBNP in the last 8760 hours. HbA1C: No results for input(s): HGBA1C in the last 72 hours. CBG: No results for input(s): GLUCAP in the last 168 hours. Lipid Profile: No results for input(s): CHOL, HDL, LDLCALC, TRIG, CHOLHDL, LDLDIRECT in the last 72 hours. Thyroid Function Tests: No results for input(s): TSH, T4TOTAL, FREET4, T3FREE, THYROIDAB in the last 72 hours. Anemia Panel: No results for input(s): VITAMINB12, FOLATE, FERRITIN, TIBC, IRON, RETICCTPCT in the last 72 hours.  Sepsis Labs: Recent Labs  Lab 08/09/19 1312 08/09/19 1550  LATICACIDVEN 1.5 1.6    Recent Results (from the past 240 hour(s))  SARS CORONAVIRUS 2 (TAT 6-24 HRS) Nasopharyngeal Nasopharyngeal Swab     Status: None   Collection Time: 08/09/19  1:39 PM   Specimen: Nasopharyngeal Swab  Result Value Ref Range Status   SARS Coronavirus 2 NEGATIVE NEGATIVE Final    Comment: (NOTE) SARS-CoV-2 target nucleic acids are NOT DETECTED. The SARS-CoV-2 RNA is generally detectable in upper and lower respiratory specimens during the acute phase of infection. Negative results do not preclude SARS-CoV-2 infection, do not rule out co-infections with other pathogens, and should not be used as the sole basis for treatment or other patient management decisions. Negative results must be combined with clinical observations, patient history, and epidemiological information. The expected result is Negative. Fact Sheet for Patients: SugarRoll.be Fact Sheet for Healthcare Providers: https://www.woods-mathews.com/ This test is not yet approved or cleared by the [place] FDA and  has been authorized for detection and/or diagnosis of  SARS-CoV-2 by FDA under an Emergency Use Authorization (EUA). This EUA will remain  in effect (meaning this test can be used) for the duration of the COVID-19 declaration under Section 56 4(b)(1) of the Act, 21 U.S.C. section 360bbb-3(b)(1), unless the authorization is terminated or revoked sooner. Performed at [facility], [place] [address] [place], [place] [address]   Blood culture (routine x 2)     Status: None (Preliminary result)   Collection Time: 08/09/19  3:34 PM   Specimen: BLOOD  Result Value Ref Range Status   Specimen Description   Final    BLOOD RIGHT ANTECUBITAL Performed at [facility] [address] [place], [place] [address]    Special Requests   Final    BOTTLES DRAWN AEROBIC AND  ANAEROBIC Blood Culture adequate volume Performed at [facility] [address], [place], [place] [address]    Culture   Final    NO GROWTH 2 DAYS Performed at [facility] [address] [place], [place] [address]    Report Status PENDING  Incomplete  Blood culture (routine x 2)     Status: None (Preliminary result)   Collection Time: 08/09/19  3:41 PM   Specimen: BLOOD  Result Value Ref Range Status   Specimen Description   Final    BLOOD BLOOD LEFT HAND Performed at [facility] [address] [place], [place] [address]    Special Requests   Final    BOTTLES DRAWN AEROBIC ONLY Blood Culture adequate volume   Culture   Final    NO GROWTH 2 DAYS Performed at [facility], [place] [address] [place], [place] [address]    Report Status PENDING  Incomplete      Radiology Studies: No results found.   Scheduled Meds: . acetaminophen  1,000 mg Oral Q8H  . enoxaparin (LOVENOX) injection  40 mg Subcutaneous Q24H  . [START ON 08/12/2019] feeding supplement (ENSURE ENLIVE)  237 mL Oral BID BM  . guaiFENesin  600 mg Oral BID  . pantoprazole  40 mg Oral BID  . potassium chloride  40 mEq Oral BID   Continuous Infusions: . dextrose 5 % and 0.9 % NaCl with KCl 40 mEq/L 75 mL/hr at 08/11/19 1635  . piperacillin-tazobactam (ZOSYN)  IV Stopped (08/11/19 1538)     LOS: 2 days    Time spent:     Shawna Clamp, MD [facility] Pager [phone]  If 7PM-7AM, please contact night-coverage [email or web address]  08/11/2019, 7:07 PM

## 2019-08-12 DIAGNOSIS — Z720 Tobacco use: Secondary | ICD-10-CM

## 2019-08-12 DIAGNOSIS — R1032 Left lower quadrant pain: Secondary | ICD-10-CM

## 2019-08-12 LAB — MAGNESIUM: Magnesium: 2.1 mg/dL (ref 1.7–2.4)

## 2019-08-12 LAB — COMPREHENSIVE METABOLIC PANEL
ALT: 14 U/L (ref 0–44)
AST: 13 U/L — ABNORMAL LOW (ref 15–41)
Albumin: 3.2 g/dL — ABNORMAL LOW (ref 3.5–5.0)
Alkaline Phosphatase: 54 U/L (ref 38–126)
Anion gap: 8 (ref 5–15)
BUN: 8 mg/dL (ref 6–20)
CO2: 24 mmol/L (ref 22–32)
Calcium: 8.6 mg/dL — ABNORMAL LOW (ref 8.9–10.3)
Chloride: 107 mmol/L (ref 98–111)
Creatinine, Ser: 0.89 mg/dL (ref 0.61–1.24)
GFR calc Af Amer: >60 mL/min (ref >60)
GFR calc non Af Amer: >60 mL/min (ref >60)
Glucose, Bld: 93 mg/dL (ref 70–99)
Potassium: 4.2 mmol/L (ref 3.5–5.1)
Sodium: 139 mmol/L (ref 135–145)
Total Bilirubin: 0.6 mg/dL (ref 0.3–1.2)
Total Protein: 6.3 g/dL — ABNORMAL LOW (ref 6.5–8.1)

## 2019-08-12 LAB — CBC
HCT: 41.3 % (ref 39.0–52.0)
Hemoglobin: 13.6 g/dL (ref 13.0–17.0)
MCH: 29.6 pg (ref 26.0–34.0)
MCHC: 32.9 g/dL (ref 30.0–36.0)
MCV: 89.8 fL (ref 80.0–100.0)
Platelets: 190 10*3/uL (ref 150–400)
RBC: 4.6 MIL/uL (ref 4.22–5.81)
RDW: 12.6 % (ref 11.5–15.5)
WBC: 8 10*3/uL (ref 4.0–10.5)
nRBC: 0 % (ref 0.0–0.2)

## 2019-08-12 MED ORDER — AMOXICILLIN-POT CLAVULANATE 875-125 MG PO TABS
1.0000 | ORAL_TABLET | Freq: Two times a day (BID) | ORAL | Status: DC
  Administered 2019-08-12 – 2019-08-13 (×2): 1 via ORAL
  Filled 2019-08-12 (×2): qty 1

## 2019-08-12 NOTE — Progress Notes (Addendum)
PROGRESS NOTE    Eric Peck  [id number] DOB: 10-26-1975 DOA: 08/09/2019 PCP: Trey Sailors, PA    Brief Narrative:  Patient is 43 y.o.male withpast medical history significant for diverticulitis with microperforation in 2016, hypertension, GERD, arthritis who presents complaining of worsening abdominal pain, hematuria and dysuria.. Lower quadrant pain is associated with nausea, some hematuria. He was evaluated at urgent care and was diagnosed with UTI and was given antibiotics. He reports swelling of his testicle. He reported low-grade fever.  Patient relate that abdominal pain got so severe that he could not stand up straight.For multiple episode of vomiting, and loose stool watery. He reports intermittent fever. He reports pain is sharp, 8 out of 10. He continues to smoke half a pack of cigarettes per day. CT renal protocol: Findings were compatible with either focal perforated colitis or diverticulitis of the sigmoid colon.There is circumferential wall thickening of the sigmoid colon compatible with colitis or potentially diverticulitis. There is a soft tissue outpouching along the margin of the colon at the site of focal perforation. There is gas within the adjacent colonic mesentery extending cranially within the abdomen.  He says his abdominal pain is improving.   Assessment & Plan:   Principal Problem:   Colitis (no diverticulosis) Active Problems:   Tobacco abuse   Hypertension   GERD (gastroesophageal reflux disease)   Leukocytosis   1-Acutesigmoid diverticulitis versus colitis with perforation;  Surgical consult appreciated.  No acute surgical intervention needed at this point -On IV Zosyn and pain control -Diet started by surgery and he is tolerating. -GI consulted to rule out ulcerative colitis. - GI consult  -continue ongoing aggressive antibiotic therapy.   Per GI he had colonoscopy without biopsies post recovery in 2016 which showed edematous,  congested sigmoid colon.  He will need a repeat colonoscopy with biopsy in several weeks once the acute process has resolved. -The recommend to continue antibiotics for 7-10 days total. -We will plan to switch to oral Augmentin.  2-Hypertension: He has not been taking blood pressure medication, he reports anxiety after taking blood pressure medication. Monitor for now, blood pressure in the 130 range. -Continue to monitor blood pressure closely and adjust medication as needed.  3-GERD: Protonix twice daily.  4-Current smoker: Counseling provided.  5-Leukocytosis; related to number one. -WBC count improving.   DVT prophylaxis: Lovenox Code Status: Full code Family Communication: Care discussed with patient Disposition Plan:  Possible discharge home tomorrow if patient tolerates diet and remains free of nausea vomiting and abdominal pain.  Consultants:   General surgery  GI  Procedures: Antimicrobials:  Anti-infectives (From admission, onward)  [] Expand by Default   Start     Dose/Rate Route Frequency Ordered Stop   08/09/19 2000  piperacillin-tazobactam (ZOSYN) IVPB 3.375 g     3.375 g 12.5 mL/hr over 240 Minutes Intravenous Every 8 hours 08/09/19 1606     08/09/19 1315  piperacillin-tazobactam (ZOSYN) IVPB 3.375 g     3.375 g 100 mL/hr over 30 Minutes           Subjective: He says his abdominal pain is a lot better.  Denies having any nausea, vomiting or diarrhea at this time.  Objective: Vitals:   08/10/19 0619 08/11/19 0648 08/11/19 1544 08/12/19 0637  BP: 127/75 136/79 117/76 131/78  Pulse: 73 63 (!) 54 (!) 55  Resp: 18 17 18 18   Temp: 98.9 F (37.2 C) 98.6 F (37 C) 98.3 F (36.8 C) 97.7 F (36.5 C)  TempSrc:  Oral Oral Oral Oral  SpO2: 95% 98% 100% 96%  Weight:      Height:        Intake/Output Summary (Last 24 hours) at 08/12/2019 1128 Last data filed at 08/12/2019 0943 Gross per 24 hour  Intake 1387.88 ml  Output -  Net  1387.88 ml   Filed Weights   08/09/19 1457  Weight: 98.4 kg    Examination:  General exam: Appears calm and comfortable  Respiratory system: Clear to auscultation. Respiratory effort normal. Cardiovascular system: S1 & S2 heard, no murmur. No pedal edema. Gastrointestinal system: Mild distention, mild tenderness without any guarding or rebound. No organomegaly or masses felt. Normal bowel sounds heard. Central nervous system: Alert and oriented. No focal neurological deficits. Extremities: Symmetric 5 x 5 power. Skin: No rashes Psychiatry: Judgement and insight appear normal. Mood & affect appropriate.     Data Reviewed: I have personally reviewed following labs and imaging studies  CBC: Recent Labs  Lab 08/09/19 1139 08/10/19 0517 08/11/19 0516 08/12/19 0459  WBC 17.9* 10.5 7.2 8.0  HGB 16.9 13.8 13.9 13.6  HCT 51.3 42.8 42.9 41.3  MCV 89.5 90.5 90.7 89.8  PLT 251 187 173 [id number]   Basic Metabolic Panel: Recent Labs  Lab 08/09/19 1139 08/10/19 0517 08/11/19 0516 08/12/19 0459  NA 139 136 138 139  K 3.9 3.5 3.3* 4.2  CL 104 106 104 107  CO2 25 22 25 24   GLUCOSE 122* 100* 95 93  BUN 17 18 13 8   CREATININE 1.06 0.95 0.97 0.89  CALCIUM 9.2 8.0* 8.2* 8.6*  MG  --   --  2.1  2.0 2.1  PHOS  --   --  2.6  --    GFR: Estimated Creatinine Clearance: 132.7 mL/min (by C-G formula based on SCr of 0.89 mg/dL). Liver Function Tests: Recent Labs  Lab 08/09/19 1139 08/10/19 0517 08/11/19 0516 08/12/19 0459  AST 21 14* 16 13*  ALT 23 17 17 14   ALKPHOS 59 47 45 54  BILITOT 0.6 0.9 0.7 0.6  PROT 7.7 6.4* 6.5 6.3*  ALBUMIN 4.4 3.6 3.5 3.2*   Recent Labs  Lab 08/09/19 1139  LIPASE 25   No results for input(s): AMMONIA in the last 168 hours. Coagulation Profile: No results for input(s): INR, PROTIME in the last 168 hours. Cardiac Enzymes: No results for input(s): CKTOTAL, CKMB, CKMBINDEX, TROPONINI in the last 168 hours. BNP (last 3 results) No results for  input(s): PROBNP in the last 8760 hours. HbA1C: No results for input(s): HGBA1C in the last 72 hours. CBG: No results for input(s): GLUCAP in the last 168 hours. Lipid Profile: No results for input(s): CHOL, HDL, LDLCALC, TRIG, CHOLHDL, LDLDIRECT in the last 72 hours. Thyroid Function Tests: No results for input(s): TSH, T4TOTAL, FREET4, T3FREE, THYROIDAB in the last 72 hours. Anemia Panel: No results for input(s): VITAMINB12, FOLATE, FERRITIN, TIBC, IRON, RETICCTPCT in the last 72 hours. Sepsis Labs: Recent Labs  Lab 08/09/19 1312 08/09/19 1550  LATICACIDVEN 1.5 1.6    Recent Results (from the past 240 hour(s))  SARS CORONAVIRUS 2 (TAT 6-24 HRS) Nasopharyngeal Nasopharyngeal Swab     Status: None   Collection Time: 08/09/19  1:39 PM   Specimen: Nasopharyngeal Swab  Result Value Ref Range Status   SARS Coronavirus 2 NEGATIVE NEGATIVE Final    Comment: (NOTE) SARS-CoV-2 target nucleic acids are NOT DETECTED. The SARS-CoV-2 RNA is generally detectable in upper and lower respiratory specimens during the acute phase of infection. Negative  results do not preclude SARS-CoV-2 infection, do not rule out co-infections with other pathogens, and should not be used as the sole basis for treatment or other patient management decisions. Negative results must be combined with clinical observations, patient history, and epidemiological information. The expected result is Negative. Fact Sheet for Patients: SugarRoll.be Fact Sheet for Healthcare Providers: https://www.woods-mathews.com/ This test is not yet approved or cleared by the [place] FDA and  has been authorized for detection and/or diagnosis of SARS-CoV-2 by FDA under an Emergency Use Authorization (EUA). This EUA will remain  in effect (meaning this test can be used) for the duration of the COVID-19 declaration under Section 56 4(b)(1) of the Act, 21 U.S.C. section 360bbb-3(b)(1),  unless the authorization is terminated or revoked sooner. Performed at [facility], [place] [address], [place], [place] [address]   Blood culture (routine x 2)     Status: None (Preliminary result)   Collection Time: 08/09/19  3:34 PM   Specimen: BLOOD  Result Value Ref Range Status   Specimen Description   Final    BLOOD RIGHT ANTECUBITAL Performed at [facility] [address] [place], [place] [address]    Special Requests   Final    BOTTLES DRAWN AEROBIC AND ANAEROBIC Blood Culture adequate volume Performed at [facility] [address], [place], [place] [address]    Culture   Final    NO GROWTH 3 DAYS Performed at [facility], [place] [address], [place], [place] [address]    Report Status PENDING  Incomplete  Blood culture (routine x 2)     Status: None (Preliminary result)   Collection Time: 08/09/19  3:41 PM   Specimen: BLOOD  Result Value Ref Range Status   Specimen Description   Final    BLOOD BLOOD LEFT HAND Performed at [facility] [address], [place], [place] [address]    Special Requests   Final    BOTTLES DRAWN AEROBIC ONLY Blood Culture adequate volume   Culture   Final    NO GROWTH 3 DAYS Performed at [facility], [place] [address], [place], [place] [address]    Report Status PENDING  Incomplete         Radiology Studies: No results found.      Scheduled Meds: . acetaminophen  1,000 mg Oral Q8H  . enoxaparin (LOVENOX) injection  40 mg Subcutaneous Q24H  . feeding supplement (ENSURE ENLIVE)  237 mL Oral BID BM  . guaiFENesin  600 mg Oral BID  . pantoprazole  40 mg Oral BID  . potassium chloride  40 mEq Oral BID   Continuous Infusions: . dextrose 5 % and 0.9 % NaCl with KCl 40 mEq/L 75 mL/hr at 08/12/19 0954  . piperacillin-tazobactam (ZOSYN)  IV 3.375 g (08/12/19 0321)     LOS: 3 days    Yaakov Guthrie, MD [facility] Pager on [place]  If 7PM-7AM,  please contact night-coverage [email or web address] Password [email or web address] 08/12/2019, 11:28 AM

## 2019-08-12 NOTE — Progress Notes (Addendum)
Daily Rounding Note  08/12/2019, 9:31 AM  LOS: 3 days   SUBJECTIVE:   Chief complaint:    Sigmoid colitis versus diverticulitis with focal perforation. Patient feels well.  Last pain meds were last night after he ate both his full liquid and regular diet trays within a short period of time.  No nausea.  Has had 2 bowel movements this morning which were soft brown, like his normal bowel movements.  Passing flatus.  Feels like he can go home.  OBJECTIVE:         Vital signs in last 24 hours:    Temp:  [97.7 F (36.5 C)-98.3 F (36.8 C)] 97.7 F (36.5 C) (11/10 0637) Pulse Rate:  [54-55] 55 (11/10 0637) Resp:  [18] 18 (11/10 0637) BP: (117-131)/(76-78) 131/78 (11/10 0637) SpO2:  [96 %-100 %] 96 % (11/10 0637) Last BM Date: 08/12/19 Filed Weights   08/09/19 1457  Weight: 98.4 kg   General: Looks well.  NAD.  Comfortable.  Not ill looking Heart: RRR. Chest: Clear bilaterally.  No labored breathing or cough Abdomen: Soft.  Nondistended.  Active bowel sounds.  Still a bit tender on the right side but no guarding or rebound. Extremities: No CCE. Neuro/Psych: Calm, pleasant, fully alert and oriented.  Intake/Output from previous day: 11/09 0701 - 11/10 0700 In: 1747.9 [P.O.:720; I.V.:827.4; IV Piggyback:200.5] Out: -   Intake/Output this shift: No intake/output data recorded.  Lab Results: Recent Labs    08/10/19 0517 08/11/19 0516 08/12/19 0459  WBC 10.5 7.2 8.0  HGB 13.8 13.9 13.6  HCT 42.8 42.9 41.3  PLT 187 173 190   BMET Recent Labs    08/10/19 0517 08/11/19 0516 08/12/19 0459  NA 136 138 139  K 3.5 3.3* 4.2  CL 106 104 107  CO2 22 25 24   GLUCOSE 100* 95 93  BUN 18 13 8   CREATININE 0.95 0.97 0.89  CALCIUM 8.0* 8.2* 8.6*   LFT Recent Labs    08/10/19 0517 08/11/19 0516 08/12/19 0459  PROT 6.4* 6.5 6.3*  ALBUMIN 3.6 3.5 3.2*  AST 14* 16 13*  ALT 17 17 14   ALKPHOS 47 45 54  BILITOT 0.9  0.7 0.6   PT/INR No results for input(s): LABPROT, INR in the last 72 hours. Hepatitis Panel No results for input(s): HEPBSAG, HCVAB, HEPAIGM, HEPBIGM in the last 72 hours.  Studies/Results: No results found.  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  Same happned 2016.  Colonoscopy without biopsies post recovery 2016 showing edematous, congested sigmoid colon. A 4 Zosyn.  No fevers, WBCs normal.   PLAN   *   ? home today on oral antibiotics.  *    I will arrange office follow-up with GI, either Dr. Carlean Purl or APP in the next couple of weeks.  At that visit future colonoscopy will be arranged.  Azucena Freed  08/12/2019, 9:31 AM Phone [phone]  GI ATTENDING  Interval history data reviewed.  Patient personally seen and examined.  Agree with interval progress note as outlined above.  Feeling much better but still some mild tenderness on exam.  Tolerating diet.  When ready, would discharge home on at least 10 days of oral antibiotics.  GI follow-up will be arranged.  Patient may need follow-up imaging prior to his clearance colonoscopy.  The eventual plan is for sigmoid resection for confirmed recurrent perforated diverticulitis.  Discussed with patient.  We  will sign off.  Docia Chuck. Geri [place]., M.D. [facility] Division of Gastroenterology

## 2019-08-12 NOTE — Progress Notes (Signed)
    CC: Abdominal pain  Subjective: Pain is markedly improved.  He has some soreness but not much.  He is tolerating a soft diet.  Objective: Vital signs in last 24 hours: Temp:  [97.7 F (36.5 C)-98.3 F (36.8 C)] 97.7 F (36.5 C) (11/10 0637) Pulse Rate:  [54-55] 55 (11/10 0637) Resp:  [18] 18 (11/10 0637) BP: (117-131)/(76-78) 131/78 (11/10 0637) SpO2:  [96 %-100 %] 96 % (11/10 0637) Last BM Date: 08/12/19 720 p.o. 1090 IV Voided x4 No BM recorded Afebrile vital signs are stable No labs today Intake/Output from previous day: 11/09 0701 - 11/10 0700 In: 1747.9 [P.O.:720; I.V.:827.4; IV Piggyback:200.5] Out: -  Intake/Output this shift: No intake/output data recorded.  General appearance: alert, cooperative and no distress Resp: clear to auscultation bilaterally GI: Soft, minimal tenderness on deep palpation left lower quadrant.  Tolerating soft diet well.  Lab Results:  Recent Labs    08/11/19 0516 08/12/19 0459  WBC 7.2 8.0  HGB 13.9 13.6  HCT 42.9 41.3  PLT 173 190    BMET Recent Labs    08/11/19 0516 08/12/19 0459  NA 138 139  K 3.3* 4.2  CL 104 107  CO2 25 24  GLUCOSE 95 93  BUN 13 8  CREATININE 0.97 0.89  CALCIUM 8.2* 8.6*   PT/INR No results for input(s): LABPROT, INR in the last 72 hours.  Recent Labs  Lab 08/09/19 1139 08/10/19 0517 08/11/19 0516 08/12/19 0459  AST 21 14* 16 13*  ALT 23 17 17 14   ALKPHOS 59 47 45 54  BILITOT 0.6 0.9 0.7 0.6  PROT 7.7 6.4* 6.5 6.3*  ALBUMIN 4.4 3.6 3.5 3.2*     Lipase     Component Value Date/Time   LIPASE 25 08/09/2019 1139     Medications: . acetaminophen  1,000 mg Oral Q8H  . enoxaparin (LOVENOX) injection  40 mg Subcutaneous Q24H  . feeding supplement (ENSURE ENLIVE)  237 mL Oral BID BM  . guaiFENesin  600 mg Oral BID  . pantoprazole  40 mg Oral BID  . potassium chloride  40 mEq Oral BID    Assessment/Plan Hypertension GERD Tobacco use Hypokalemia -will replace/check  mag  Acute colitis/diverticulitis with probable microperforation/sepsis small amount of air in mesentery  -Hx colitis/diverticulitis with microperforation 2016/colonoscopy,  (Dr. Greer Pickerel, 05/23/2015 colitis presumed infectious; no surgical indication at that time.)  - colonoscopy, Dr. [name] [id number]: Circumferential diffuse abnormal mucosa was found in the sigmoid colon; The mucosa was edematous and congested  - EGD 08/18/14, Dr. Silvano Rusk:  - Erosive gastropathy. Biopsied.  The examination was otherwise normal.  FEN: soft diet/IV fluids ID: Zosyn 11/7 >> day 4 DVT: Lovenox/SCDs Follow-up: TBD  Plan: I would convert to oral antibiotics, recheck labs in a.m. and follow-up with Dr. Carlean Purl.       LOS: 3 days    Eric Peck 08/12/2019 Please see Amion

## 2019-08-13 DIAGNOSIS — D72825 Bandemia: Secondary | ICD-10-CM

## 2019-08-13 DIAGNOSIS — F419 Anxiety disorder, unspecified: Secondary | ICD-10-CM

## 2019-08-13 DIAGNOSIS — F329 Major depressive disorder, single episode, unspecified: Secondary | ICD-10-CM

## 2019-08-13 DIAGNOSIS — K219 Gastro-esophageal reflux disease without esophagitis: Secondary | ICD-10-CM

## 2019-08-13 LAB — BASIC METABOLIC PANEL
Anion gap: 9 (ref 5–15)
BUN: 12 mg/dL (ref 6–20)
CO2: 23 mmol/L (ref 22–32)
Calcium: 8.9 mg/dL (ref 8.9–10.3)
Chloride: 106 mmol/L (ref 98–111)
Creatinine, Ser: 0.91 mg/dL (ref 0.61–1.24)
GFR calc Af Amer: >60 mL/min (ref >60)
GFR calc non Af Amer: >60 mL/min (ref >60)
Glucose, Bld: 102 mg/dL — ABNORMAL HIGH (ref 70–99)
Potassium: 3.7 mmol/L (ref 3.5–5.1)
Sodium: 138 mmol/L (ref 135–145)

## 2019-08-13 LAB — CBC
HCT: 42.6 % (ref 39.0–52.0)
Hemoglobin: 14.3 g/dL (ref 13.0–17.0)
MCH: 29.5 pg (ref 26.0–34.0)
MCHC: 33.6 g/dL (ref 30.0–36.0)
MCV: 87.8 fL (ref 80.0–100.0)
Platelets: 219 10*3/uL (ref 150–400)
RBC: 4.85 MIL/uL (ref 4.22–5.81)
RDW: 12.4 % (ref 11.5–15.5)
WBC: 9.4 10*3/uL (ref 4.0–10.5)
nRBC: 0 % (ref 0.0–0.2)

## 2019-08-13 MED ORDER — AMOXICILLIN-POT CLAVULANATE 875-125 MG PO TABS: 1.0000 | ORAL_TABLET | Freq: Two times a day (BID) | ORAL | 0 refills | Status: DC

## 2019-08-13 MED ORDER — SACCHAROMYCES BOULARDII 250 MG PO CAPS: 250.0000 mg | ORAL_CAPSULE | Freq: Two times a day (BID) | ORAL | 0 refills | Status: DC

## 2019-08-13 NOTE — Progress Notes (Signed)
Pt discharged home in stable condition. Discharge instructions given. Scripts sent to pharmacy of choice. No immediate questions or concerns at this time. Pt opted to ambulate off of unit.  

## 2019-08-13 NOTE — Discharge Summary (Signed)
Physician Discharge Summary  Eric Peck [id number] DOB: 25-Nov-1975 DOA: 08/09/2019  PCP: Trey Sailors, PA  Admit date: 08/09/2019 Discharge date: 08/13/2019  Admitted From: Home Disposition: Home  Recommendations for Outpatient Follow-up:  1. Follow up with PCP in 1-2 weeks 2. Follow-up with gastroenterology as below 3. Please obtain CBC/BMP/Mag at follow up 4. Please follow up on the following pending results: None  Home Health: None required Equipment/Devices: None needed  Discharge Condition: Stable CODE STATUS: Full code  Follow-up Information    Esterwood, Amy S, PA-C. Go on 09/02/2019.   Specialty: Gastroenterology Why: 3 PM visit with PA for GI, Dr Carlean Purl.   Contact information: [place] [address] [phone]        Trey Sailors, PA. Schedule an appointment as soon as possible for a visit in 1 week(s).   Specialty: Physician Assistant Contact information: [place] [address] [phone]           HPI: Per Dr.  Leonie Green Peck is a 43 y.o. male past medical history significant for diverticulitis with microperforation in 2016, hypertension, GERD, arthritis who presents complaining of worsening abdominal pain, hematuria and dysuria.  Patient report abdominal pain yesterday 3 days prior to admission.  Lower quadrant associated with nausea.  He reports some hematuria.  He was evaluated at urgent care and was diagnosed with UTI and was given antibiotics.  He reports swelling of his testicle.  He reported low-grade fever.  Patient relate that abdominal pain got so severe that he could not stand up straight.  For multiple episode of vomiting, and loose stool watery.  He reports intermittent fever.  He reports pain is sharp, 8 out of 10.  He continues to smoke half a pack of cigarettes per day.   Evaluation in the ED: Sodium 139, potassium 3.9, glucose 122, BUN 17, creatinine 1.0, anion gap 10, alkaline  phosphatase 59, albumin 4.4, lipase 25, AST 21, ALT 23, white blood cell 17, hemoglobin 16, platelets 251.  SARS coronavirus 2 pending. CT renal protocol:Overall findings are compatible with either focal perforated colitis or diverticulitis of the sigmoid colon. There is circumferential wall thickening of the sigmoid colon compatible with colitis or potentially diverticulitis. There is a soft tissue outpouching along the margin of the colon at the site of focal perforation. There is gas within the adjacent colonic mesentery extending cranially within the abdomen.  Hospital Course: Patient with past history as above admitted with worsening abdominal pain, hematuria and dysuria found to have perforated cecum reticulitis/colitis.  Evaluated by GI and general surgery who recommended conservative management with IV antibiotics and adjustment of his diuretics and outpatient follow-up.  GI recommended antibiotic for a total of 10 to 14 days.  Accordingly, patient was discharged on Augmentin for additional 10 days.  Patient felt well and tolerated soft diet without problem.  Her nausea or vomiting.  Abdominal pain improved tremendously.  Patient to follow-up with GI on 09/02/2019  See individual problem list below for more on hospital course.  Subjective: No major events overnight of morning.  No complaints.  Abdominal pain basically resolved.  No nausea vomiting.  Having regular bowel movement.  Denies melena or hematochezia.  No UTI symptoms.  Feels well and ready to go home.  Discharge Diagnoses:  Acutesigmoid diverticulitis versus colitis with perforation -Noted on CT.  Symptoms improved and tolerated soft diet. -IV Zosyn 11/7-11/11.  Augmentin 11/11-11/20 -Probiotics -GI follow-up on 09/02/2019  Essential  hypertension: Normotensive.  Not on medications.  GERD: Protonix  Tobacco use disorder: Encouraged to quit.   Leukocytosis: Likely due to #1.  Resolved.  Depression/anxiety: Stable.   Discharged on home medication.  Discharge Instructions  Discharge Instructions    Call MD for:  extreme fatigue   Complete by: As directed    Call MD for:  persistant dizziness or light-headedness   Complete by: As directed    Call MD for:  persistant nausea and vomiting   Complete by: As directed    Call MD for:  severe uncontrolled pain   Complete by: As directed    Call MD for:  temperature >100.4   Complete by: As directed    Diet - low sodium heart healthy   Complete by: As directed    Discharge instructions   Complete by: As directed    It has been a pleasure taking care of you! You were hospitalized with abdominal pain and some UTI symptoms likely due to diverticulitis and perforation.  You were treated with antibiotics.  You are discharged on more antibiotics to complete treatment course.  Please follow-up with your primary care doctor and gastroenterology as recommended/listed under follow-up section. Please review your new medication list and the directions before you take your medications.   Take care,   Increase activity slowly   Complete by: As directed      Allergies as of 08/13/2019   No Known Allergies     Medication List    STOP taking these medications   ciprofloxacin 500 MG tablet Commonly known as: CIPRO   diclofenac 75 MG EC tablet Commonly known as: VOLTAREN   HYDROcodone-acetaminophen 5-325 MG tablet Commonly known as: NORCO/VICODIN   ibuprofen 200 MG tablet Commonly known as: ADVIL   metroNIDAZOLE 500 MG tablet Commonly known as: FLAGYL   ondansetron 4 MG tablet Commonly known as: ZOFRAN   oxyCODONE-acetaminophen 5-325 MG tablet Commonly known as: PERCOCET/ROXICET     TAKE these medications   amitriptyline 25 MG tablet Commonly known as: ELAVIL Take 1 tablet (25 mg total) by mouth at bedtime as needed for sleep.   amoxicillin-clavulanate 875-125 MG tablet Commonly known as: AUGMENTIN Take 1 tablet by mouth every 12 (twelve)  hours.   escitalopram 20 MG tablet Commonly known as: Lexapro Take 1 tablet (20 mg total) by mouth daily.   fluticasone 50 MCG/ACT nasal spray Commonly known as: FLONASE Place 2 sprays into both nostrils daily.   gabapentin 300 MG capsule Commonly known as: NEURONTIN Take 1 capsule (300 mg total) by mouth at bedtime.   pantoprazole 40 MG tablet Commonly known as: PROTONIX Take 1 tablet (40 mg total) by mouth daily.   phenazopyridine 200 MG tablet Commonly known as: PYRIDIUM Take 200 mg by mouth 3 (three) times daily as needed for pain.   pravastatin 20 MG tablet Commonly known as: PRAVACHOL Take 1 tablet (20 mg total) by mouth daily.   promethazine 25 MG tablet Commonly known as: PHENERGAN Take 25 mg by mouth every 6 (six) hours as needed for nausea or vomiting.   saccharomyces boulardii 250 MG capsule Commonly known as: Florastor Take 1 capsule (250 mg total) by mouth 2 (two) times daily.       Consultations:  Gastroenterology  General surgery  Procedures/Studies:  2D Echo: None  Ct Renal Stone Study  Result Date: 08/09/2019 CLINICAL DATA:  Abdominal pain. History of diverticulitis. Flank pain. EXAM: CT ABDOMEN AND PELVIS WITHOUT CONTRAST TECHNIQUE: Multidetector CT imaging of the  abdomen and pelvis was performed following the standard protocol without IV contrast. COMPARISON:  CT abdomen pelvis 10/30/2016 FINDINGS: Lower chest: Normal heart size. Dependent atelectasis within the left lower lobe. No pleural effusion. Hepatobiliary: Liver is normal in size and contour. Gallbladder is unremarkable. Pancreas: Unremarkable Spleen: Unremarkable Adrenals/Urinary Tract: Normal adrenal glands. Kidneys are symmetric in size. No hydronephrosis. Urinary bladder is unremarkable. Stomach/Bowel: Circumferential wall thickening of the sigmoid colon (image 75; series 2). Focal outpouching of soft tissue along the margin of the colon (image 74; series 2). There is surrounding  mesenteric fat stranding as well as gas within the mesentery coursing with in the mid aspect of the abdomen compatible with perforation. No evidence for upstream bowel obstruction. Normal morphology of the stomach. Vascular/Lymphatic: Normal caliber abdominal aorta. No retroperitoneal lymphadenopathy. Reproductive: Prostate is unremarkable. Other: None. Musculoskeletal: No aggressive or acute appearing osseous lesions. IMPRESSION: Overall findings are compatible with either focal perforated colitis or diverticulitis of the sigmoid colon. There is circumferential wall thickening of the sigmoid colon compatible with colitis or potentially diverticulitis. There is a soft tissue outpouching along the margin of the colon at the site of focal perforation. There is gas within the adjacent colonic mesentery extending cranially within the abdomen. Critical Value/emergent results were called by telephone at the time of interpretation on 08/09/2019 at 1:11 pm to [place] , who verbally acknowledged these results. Electronically Signed   By: Lovey Newcomer M.D.   On: 08/09/2019 13:13       Discharge Exam: Vitals:   08/12/19 1955 08/13/19 0527  BP: (!) 145/73 140/85  Pulse: 70 (!) 57  Resp: 17 17  Temp: 98.4 F (36.9 C) 98.4 F (36.9 C)  SpO2: 97% 98%    GENERAL: No acute distress.  Appears well.  HEENT: MMM.  Vision and hearing grossly intact.  NECK: Supple.  No apparent JVD.  RESP:  No IWOB. Good air movement bilaterally. CVS:  RRR. Heart sounds normal.  ABD/GI/GU: Bowel sounds present. Soft.  Mild diffuse tenderness over LLQ no rebound or guarding MSK/EXT:  Moves extremities. No apparent deformity or edema.  SKIN: no apparent skin lesion or wound NEURO: Awake, alert and oriented appropriately.  No gross deficit.  PSYCH: Calm. Normal affect.   The results of significant diagnostics from this hospitalization (including imaging, microbiology, ancillary and laboratory) are listed below for  reference.     Microbiology: Recent Results (from the past 240 hour(s))  SARS CORONAVIRUS 2 (TAT 6-24 HRS) Nasopharyngeal Nasopharyngeal Swab     Status: None   Collection Time: 08/09/19  1:39 PM   Specimen: Nasopharyngeal Swab  Result Value Ref Range Status   SARS Coronavirus 2 NEGATIVE NEGATIVE Final    Comment: (NOTE) SARS-CoV-2 target nucleic acids are NOT DETECTED. The SARS-CoV-2 RNA is generally detectable in upper and lower respiratory specimens during the acute phase of infection. Negative results do not preclude SARS-CoV-2 infection, do not rule out co-infections with other pathogens, and should not be used as the sole basis for treatment or other patient management decisions. Negative results must be combined with clinical observations, patient history, and epidemiological information. The expected result is Negative. Fact Sheet for Patients: SugarRoll.be Fact Sheet for Healthcare Providers: https://www.woods-mathews.com/ This test is not yet approved or cleared by the [place] FDA and  has been authorized for detection and/or diagnosis of SARS-CoV-2 by FDA under an Emergency Use Authorization (EUA). This EUA will remain  in effect (meaning this test can be used) for the duration  of the COVID-19 declaration under Section 56 4(b)(1) of the Act, 21 U.S.C. section 360bbb-3(b)(1), unless the authorization is terminated or revoked sooner. Performed at [facility], [place] [address], [place], [place] [address]   Blood culture (routine x 2)     Status: None (Preliminary result)   Collection Time: 08/09/19  3:34 PM   Specimen: BLOOD  Result Value Ref Range Status   Specimen Description   Final    BLOOD RIGHT ANTECUBITAL Performed at [facility] [address], [place], [place] [address]    Special Requests   Final    BOTTLES DRAWN AEROBIC AND ANAEROBIC Blood Culture adequate volume Performed at [facility] [address] [place], [place] [address]    Culture   Final    NO GROWTH 4 DAYS Performed at [facility], [place] [address], [place], [place] [address]    Report Status PENDING  Incomplete  Blood culture (routine x 2)     Status: None (Preliminary result)   Collection Time: 08/09/19  3:41 PM   Specimen: BLOOD  Result Value Ref Range Status   Specimen Description   Final    BLOOD BLOOD LEFT HAND Performed at [facility] [address], [place], [place] [address]    Special Requests   Final    BOTTLES DRAWN AEROBIC ONLY Blood Culture adequate volume   Culture   Final    NO GROWTH 4 DAYS Performed at [facility], [place] [address] [place],  [address]    Report Status PENDING  Incomplete     Labs: BNP (last 3 results) No results for input(s): BNP in the last 8760 hours. Basic Metabolic Panel: Recent Labs  Lab 08/09/19 1139 08/10/19 0517 08/11/19 0516 08/12/19 0459 08/13/19 0528  NA 139 136 138 139 138  K 3.9 3.5 3.3* 4.2 3.7  CL 104 106 104 107 106  CO2 25 22 25 24 23   GLUCOSE 122* 100* 95 93 102*  BUN 17 18 13 8 12   CREATININE 1.06 0.95 0.97 0.89 0.91  CALCIUM 9.2 8.0* 8.2* 8.6* 8.9  MG  --   --  2.1   2.0 2.1  --   PHOS  --   --  2.6  --   --    Liver Function Tests: Recent Labs  Lab 08/09/19 1139 08/10/19 0517 08/11/19 0516 08/12/19 0459  AST 21 14* 16 13*  ALT 23 17 17 14   ALKPHOS 59 47 45 54  BILITOT 0.6 0.9 0.7 0.6  PROT 7.7 6.4* 6.5 6.3*  ALBUMIN 4.4 3.6 3.5 3.2*   Recent Labs  Lab 08/09/19 1139  LIPASE 25   No results for input(s): AMMONIA in the last 168 hours. CBC: Recent Labs  Lab 08/09/19 1139 08/10/19 0517 08/11/19 0516 08/12/19 0459 08/13/19 0528  WBC 17.9* 10.5 7.2 8.0 9.4  HGB 16.9 13.8 13.9 13.6 14.3  HCT 51.3 42.8 42.9 41.3 42.6  MCV 89.5 90.5 90.7 89.8 87.8  PLT 251 187 173 190 219   Cardiac Enzymes: No results for input(s): CKTOTAL, CKMB, CKMBINDEX,  TROPONINI in the last 168 hours. BNP: Invalid input(s): POCBNP CBG: No results for input(s): GLUCAP in the last 168 hours. D-Dimer No results for input(s): DDIMER in the last 72 hours. Hgb A1c No results for input(s): HGBA1C in the last 72 hours. Lipid Profile No results for input(s): CHOL, HDL, LDLCALC, TRIG, CHOLHDL, LDLDIRECT in the last 72 hours. Thyroid function  studies No results for input(s): TSH, T4TOTAL, T3FREE, THYROIDAB in the last 72 hours.  Invalid input(s): FREET3 Anemia work up No results for input(s): VITAMINB12, FOLATE, FERRITIN, TIBC, IRON, RETICCTPCT in the last 72 hours. Urinalysis    Component Value Date/Time   COLORURINE ORANGE (A) 08/09/2019 1139   APPEARANCEUR CLEAR 08/09/2019 1139   LABSPEC >1.030 (H) 08/09/2019 1139   PHURINE 6.5 08/09/2019 1139   GLUCOSEU NEGATIVE 08/09/2019 1139   HGBUR NEGATIVE 08/09/2019 1139   BILIRUBINUR NEGATIVE 08/09/2019 1139   KETONESUR NEGATIVE 08/09/2019 1139   PROTEINUR NEGATIVE 08/09/2019 1139   UROBILINOGEN 1.0 04/29/2015 1430   NITRITE NEGATIVE 08/09/2019 1139   LEUKOCYTESUR NEGATIVE 08/09/2019 1139   Sepsis Labs Invalid input(s): PROCALCITONIN,  WBC,  LACTICIDVEN   Time coordinating discharge: 35 minutes  SIGNED:  Mercy Riding, MD  [facility] 08/13/2019, 7:35 PM  If 7PM-7AM, please contact night-coverage [email or web address] Password [email or web address]

## 2019-08-13 NOTE — Discharge Instructions (Signed)

## 2019-08-14 LAB — CULTURE, BLOOD (ROUTINE X 2)
Culture: NO GROWTH
Culture: NO GROWTH
Special Requests: ADEQUATE
Special Requests: ADEQUATE

## 2019-09-02 ENCOUNTER — Other Ambulatory Visit (INDEPENDENT_AMBULATORY_CARE_PROVIDER_SITE_OTHER): Payer: Self-pay

## 2019-09-02 ENCOUNTER — Encounter: Payer: Self-pay | Admitting: Physician Assistant

## 2019-09-02 ENCOUNTER — Ambulatory Visit (INDEPENDENT_AMBULATORY_CARE_PROVIDER_SITE_OTHER): Payer: Self-pay | Admitting: Physician Assistant

## 2019-09-02 VITALS — BP 132/74 | HR 95 | Temp 98.4°F | Ht 72.0 in | Wt 224.0 lb

## 2019-09-02 DIAGNOSIS — R102 Pelvic and perineal pain: Secondary | ICD-10-CM

## 2019-09-02 DIAGNOSIS — R1032 Left lower quadrant pain: Secondary | ICD-10-CM

## 2019-09-02 DIAGNOSIS — Z8719 Personal history of other diseases of the digestive system: Secondary | ICD-10-CM

## 2019-09-02 LAB — BASIC METABOLIC PANEL
BUN: 13 mg/dL (ref 6–23)
CO2: 29 mEq/L (ref 19–32)
Calcium: 9.4 mg/dL (ref 8.4–10.5)
Chloride: 104 mEq/L (ref 96–112)
Creatinine, Ser: 0.89 mg/dL (ref 0.40–1.50)
GFR: 93.87 mL/min (ref >60.00)
Glucose, Bld: 97 mg/dL (ref 70–99)
Potassium: 4.2 mEq/L (ref 3.5–5.1)
Sodium: 139 mEq/L (ref 135–145)

## 2019-09-02 LAB — CBC WITH DIFFERENTIAL/PLATELET
Basophils Absolute: 0.1 10*3/uL (ref 0.0–0.1)
Basophils Relative: 0.8 % (ref 0.0–3.0)
Eosinophils Absolute: 0.4 10*3/uL (ref 0.0–0.7)
Eosinophils Relative: 5.2 % — ABNORMAL HIGH (ref 0.0–5.0)
HCT: 44.4 % (ref 39.0–52.0)
Hemoglobin: 15.1 g/dL (ref 13.0–17.0)
Lymphocytes Relative: 28.8 % (ref 12.0–46.0)
Lymphs Abs: 2 10*3/uL (ref 0.7–4.0)
MCHC: 34 g/dL (ref 30.0–36.0)
MCV: 86.2 fl (ref 78.0–100.0)
Monocytes Absolute: 0.6 10*3/uL (ref 0.1–1.0)
Monocytes Relative: 9.5 % (ref 3.0–12.0)
Neutro Abs: 3.8 10*3/uL (ref 1.4–7.7)
Neutrophils Relative %: 55.7 % (ref 43.0–77.0)
Platelets: 254 10*3/uL (ref 150.0–400.0)
RBC: 5.14 Mil/uL (ref 4.22–5.81)
RDW: 13.5 % (ref 11.5–15.5)
WBC: 6.8 10*3/uL (ref 4.0–10.5)

## 2019-09-02 LAB — SEDIMENTATION RATE: Sed Rate: 7 mm/hr (ref 0–15)

## 2019-09-02 MED ORDER — AMOXICILLIN-POT CLAVULANATE 875-125 MG PO TABS: 1.0000 | ORAL_TABLET | Freq: Two times a day (BID) | ORAL | 0 refills | Status: DC

## 2019-09-02 NOTE — Patient Instructions (Addendum)
If you are age 41 or older, your body mass index should be between 23-30. Your Body mass index is 30.38 kg/m. If this is out of the aforementioned range listed, please consider follow up with your Primary Care Provider.  If you are age 93 or younger, your body mass index should be between 19-25. Your Body mass index is 30.38 kg/m. If this is out of the aformentioned range listed, please consider follow up with your Primary Care Provider.   Your provider has requested that you go to the basement level for lab work before leaving today. Press "B" on the elevator. The lab is located at the first door on the left as you exit the elevator.  We have sent the following medications to your pharmacy for you to pick up at your convenience: Augmentin  You have been scheduled for a CT scan of the abdomen and pelvis at [facility].  You are scheduled on 09/03/19 at 7:30 am. You should arrive 15 minutes prior to your appointment time for registration. Please follow the written instructions below on the day of your exam:  WARNING: IF YOU ARE ALLERGIC TO IODINE/X-RAY DYE, PLEASE NOTIFY RADIOLOGY IMMEDIATELY AT [phone]! YOU WILL BE GIVEN A 13 HOUR PREMEDICATION PREP.  1) Do not eat or drink anything after 3:30 am (4 hours prior to your test) 2) You have been given 2 bottles of oral contrast to drink. The solution may taste better if refrigerated, but do NOT add ice or any other liquid to this solution. Shake well before drinking.    Drink 1 bottle of contrast @ 5:30 am (2 hours prior to your exam)  Drink 1 bottle of contrast @ 6:30 am (1 hour prior to your exam)  You may take any medications as prescribed with a small amount of water, if necessary. If you take any of the following medications: METFORMIN, GLUCOPHAGE, GLUCOVANCE, AVANDAMET, RIOMET, FORTAMET, [place] MET, JANUMET, GLUMETZA or METAGLIP, you MAY be asked to HOLD this medication 48 hours AFTER the exam.  The purpose of you drinking  the oral contrast is to aid in the visualization of your intestinal tract. The contrast solution may cause some diarrhea. Depending on your individual set of symptoms, you may also receive an intravenous injection of x-ray contrast/dye. Plan on being at [facility] for 30 minutes or longer, depending on the type of exam you are having performed.  This test typically takes 30-45 minutes to complete.  If you have any questions regarding your exam or if you need to reschedule, you may call the CT department at [phone] the hours of 8:00 am and 5:00 pm, Monday-Friday.  ______________________________________________________________  Call for CT results tomorrow afternoon.  Thank you for choosing me and [facility].   Amy Esterwood, PA-C

## 2019-09-02 NOTE — Progress Notes (Signed)
Subjective:    Patient ID: Eric Peck, male    DOB: 1976-06-30, 43 y.o.   MRN: [id number]  HPI Eric Peck is a pleasant 43 year old white male, established with Dr. Carlean Purl, who comes in today for post hospital follow-up.  He was hospitalized 11/7 through 08/13/2019 after he presented with acute lower left flank and abdominal pain which was severe. He underwent CT per renal stone protocol on 08/09/2019 that showed circumferential wall thickening of the sigmoid colon and focal outpouching of soft tissue along the margin of the colon.  There was surrounding mesenteric fat stranding as well as gas within the mesentery coursing within the mid aspect of the abdomen compatible with perforation.  No evidence of upstream bowel obstruction.  He was started on IV Zosyn.  Surgery was consulted, and advised conservative management and GI consult.  He was seen by GI on 08/11/2019, Dr. Henrene Pastor who felt this represented perforated diverticulitis versus colitis.  The patient had had a similar problem about 4 years ago, and underwent colonoscopy after that episode in July 2016 which did show a circumferential patch of abnormal mucosa in the sigmoid colon which was edematous and congested.  Biopsies were not done. Patient improved symptomatically and WBC improved.  He was discharged home to finish a 10-day course of Augmentin which he completed on 08/22/2019.  Plan was for follow-up imaging and then consideration of follow-up colonoscopy. Patient says that he did well while he was on the antibiotics other than one episode about 2 days after discharge where he had abdominal pain for a few hours which then resolved.  He says after he finished the antibiotics he developed constant "poking" left sided abdominal pain with occasional episodes of more severe pain.  This is now been constant over the past 10 days.  He has been having some sweats and chills at home but no documented fever.  He has had low-level nausea without vomiting  and has been eating light with small frequent feedings.  He does get increased abdominal pain with larger meals.  He is soft stools but not diarrhea and has not noticed any blood though says they have been dark at times.  He is experiencing increased pain with walking and riding in the car.  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  Today he is also complaining of some pain in the left lower back and says he is not sure if this is associated or not.  Review of Systems Pertinent positive and negative review of systems were noted in the above HPI section.  All other review of systems was otherwise negative.  Outpatient Encounter Medications as of 09/02/2019  Medication Sig  . amoxicillin-clavulanate (AUGMENTIN) 875-125 MG tablet Take 1 tablet by mouth 2 (two) times daily. Take twice daily for 14 days.  . [DISCONTINUED] amitriptyline (ELAVIL) 25 MG tablet Take 1 tablet (25 mg total) by mouth at bedtime as needed for sleep. (Patient not taking: Reported on 08/09/2019)  . [DISCONTINUED] amoxicillin-clavulanate (AUGMENTIN) 875-125 MG tablet Take 1 tablet by mouth every 12 (twelve) hours.  . [DISCONTINUED] escitalopram (LEXAPRO) 20 MG tablet Take 1 tablet (20 mg total) by mouth daily. (Patient not taking: Reported on 08/09/2019)  . [DISCONTINUED] fluticasone (FLONASE) 50 MCG/ACT nasal spray Place 2 sprays into both nostrils daily. (Patient not taking: Reported on 08/09/2019)  . [DISCONTINUED] gabapentin (NEURONTIN) 300 MG capsule Take 1 capsule (300 mg total) by mouth at bedtime. (Patient not taking:  Reported on 08/09/2019)  . [DISCONTINUED] pantoprazole (PROTONIX) 40 MG tablet Take 1 tablet (40 mg total) by mouth daily. (Patient not taking: Reported on 08/09/2019)  . [DISCONTINUED] phenazopyridine (PYRIDIUM) 200 MG tablet Take 200 mg by mouth 3 (three) times daily as needed for pain.  . [DISCONTINUED] pravastatin (PRAVACHOL) 20 MG tablet Take 1 tablet (20 mg  total) by mouth daily. (Patient not taking: Reported on 10/30/2016)  . [DISCONTINUED] promethazine (PHENERGAN) 25 MG tablet Take 25 mg by mouth every 6 (six) hours as needed for nausea or vomiting.  . [DISCONTINUED] saccharomyces boulardii (FLORASTOR) 250 MG capsule Take 1 capsule (250 mg total) by mouth 2 (two) times daily.   No facility-administered encounter medications on file as of 09/02/2019.    No Known Allergies Patient Active Problem List   Diagnosis Date Noted  . GERD (gastroesophageal reflux disease)   . Leukocytosis   . Hypertension 11/06/2016  . Tobacco abuse 05/24/2016  . Constipation 05/24/2016  . Chronic RUQ pain 04/12/2016  . Colitis (no diverticulosis) 03/12/2015   Social History   Socioeconomic History  . Marital status: Married    Spouse name: Not on file  . Number of children: Not on file  . Years of education: Not on file  . Highest education level: Not on file  Occupational History  . Not on file  Social Needs  . Financial resource strain: Not on file  . Food insecurity    Worry: Not on file    Inability: Not on file  . Transportation needs    Medical: Not on file    Non-medical: Not on file  Tobacco Use  . Smoking status: Current Every Day Smoker    Packs/day: 0.50    Types: Cigarettes  . Smokeless tobacco: Never Used  . Tobacco comment: form given 08-02-16  Substance and Sexual Activity  . Alcohol use: No  . Drug use: Yes    Frequency: 5.0 times per week    Types: Marijuana    Comment: or more  . Sexual activity: Not on file  Lifestyle  . Physical activity    Days per week: Not on file    Minutes per session: Not on file  . Stress: Not on file  Relationships  . Social Herbalist on phone: Not on file    Gets together: Not on file    Attends religious service: Not on file    Active member of club or organization: Not on file    Attends meetings of clubs or organizations: Not on file    Relationship status: Not on file  .  Intimate partner violence    Fear of current or ex partner: Not on file    Emotionally abused: Not on file    Physically abused: Not on file    Forced sexual activity: Not on file  Other Topics Concern  . Not on file  Social History Narrative   Married 1 son a1 daughter   Unemployed carpenter   Smoker   4 caffeine/day   Frequent marijuana   08/02/2016    Mr. Wymer's family history includes Breast cancer in his maternal grandmother and paternal grandmother; COPD in his mother; Colitis in his maternal grandmother; Crohn's disease in his cousin; Cystic fibrosis in his mother; Diabetes in his father; Drug abuse in his mother; Eczema in his brother; Inflammatory bowel disease in his mother; Irritable bowel syndrome in his brother and mother; Kidney disease in his father; Mental retardation in his brother;  Prostate cancer in his paternal uncle.      Objective:    Vitals:   09/02/19 1503  BP: 132/74  Pulse: 95  Temp: 98.4 F (36.9 C)    Physical Exam Well-developed well-nourished white male in no acute distress.  Height, Weight, 224 BMI 30.3  HEENT; nontraumatic normocephalic, EOMI, PER R LA, sclera anicteric. Oropharynx; not examined/mask/Covid Neck; supple, no JVD Cardiovascular; regular rate and rhythm with S1-S2, no murmur rub or gallop Pulmonary; Clear bilaterally Abdomen; soft, bowel sounds are present, he is quite tender in the left lower quadrant and suprapubic area there is some guarding, no rebound, no palpable mass or hepatosplenomegaly Rectal; not done today Skin; benign exam, no jaundice rash or appreciable lesions Extremities; no clubbing cyanosis or edema skin warm and dry Neuro/Psych; alert and oriented x4, grossly nonfocal mood and affect appropriate       Assessment & Plan:   #46 43 year old white male with recent hospitalization with either perforated sigmoid diverticulitis versus focal perforation in the area of colitis i.e. IBD.  Patient improved while on  oral antibiotics at home however symptoms have recurred shortly after completing the course of Augmentin and he has now been having ongoing pain again for the past 10 days associated with chills and sweats and new left back pain.  Suspect his inflammatory process persists, rule out associated abscess  #2 hypertension 3.  GERD  Plan; CBC with differential today, sed rate, be met Schedule for CT of the abdomen and pelvis in a.m. tomorrow. Start Augmentin 875 1 p.o. twice daily to start this evening. Very soft to full liquid diet. Offered an antiemetic but he does not feel he needs. Further plans pending results of CT, hopefully will not require repeat hospitalization  Amantha Sklar Genia Harold PA-C 09/02/2019   Cc: Trey Sailors, PA

## 2019-09-03 ENCOUNTER — Ambulatory Visit (HOSPITAL_COMMUNITY)
Admission: RE | Admit: 2019-09-03 | Discharge: 2019-09-03 | Disposition: A | Discharge status: Home / Self Care | Payer: Self-pay | Source: Ambulatory Visit | Attending: Physician Assistant | Admitting: Physician Assistant

## 2019-09-03 ENCOUNTER — Other Ambulatory Visit: Payer: Self-pay

## 2019-09-03 ENCOUNTER — Encounter (HOSPITAL_COMMUNITY): Payer: Self-pay

## 2019-09-03 DIAGNOSIS — R1032 Left lower quadrant pain: Secondary | ICD-10-CM | POA: Insufficient documentation

## 2019-09-03 DIAGNOSIS — Z8719 Personal history of other diseases of the digestive system: Secondary | ICD-10-CM | POA: Insufficient documentation

## 2019-09-03 DIAGNOSIS — R102 Pelvic and perineal pain: Secondary | ICD-10-CM | POA: Insufficient documentation

## 2019-09-03 IMAGING — CT CT ABD-PELV W/ CM
2 of 5 series · 16 of 46 positions shown, 18 images · IV contrast (OMNIPAQUE)
Comparison: CT scan [DATE]

CLINICAL DATA: Follow-up complicated diverticulitis. Low-grade
fever.

EXAM:
CT ABDOMEN AND PELVIS WITH CONTRAST
TECHNIQUE: Multidetector CT imaging of the abdomen and pelvis was performed
using the standard protocol following bolus administration of
intravenous contrast.
CONTRAST:  100mL OMNIPAQUE IOHEXOL 300 MG/ML  SOLN

[Series 2: axial st · axial · 0.85mm/px · z∈[+1041,+1451]mm · 13 of 96 slices shown, 15 images]
[im 7/96  soft-tissue]
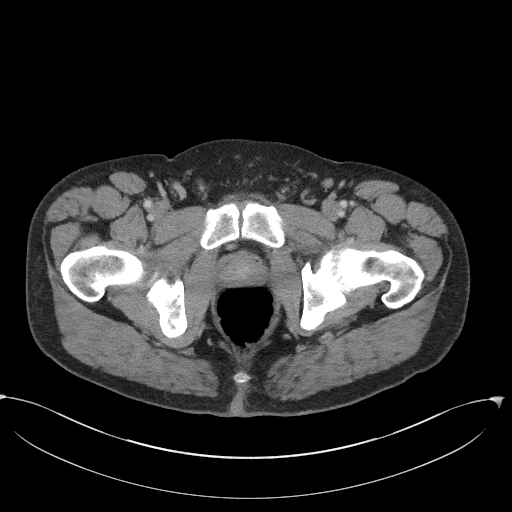
[im 7/96  bone]
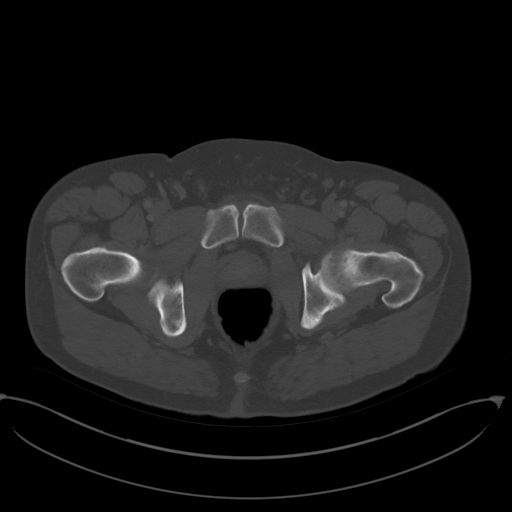
[im 14/96  soft-tissue]
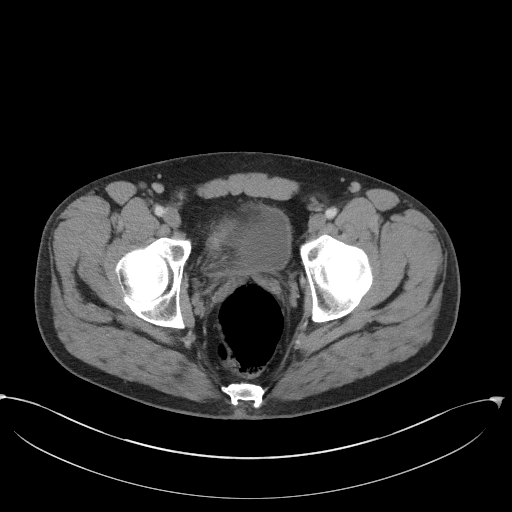
[im 21/96  soft-tissue]
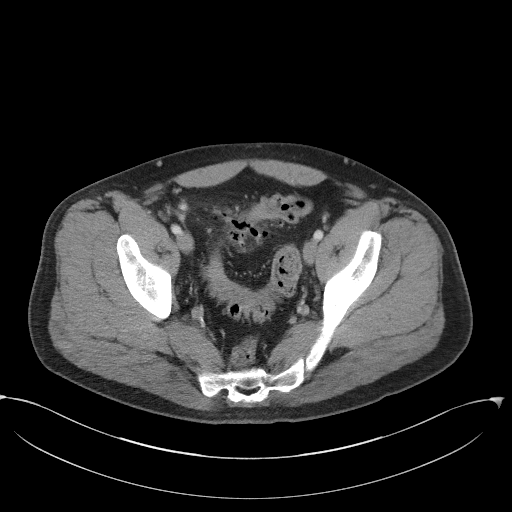
[im 28/96  soft-tissue]
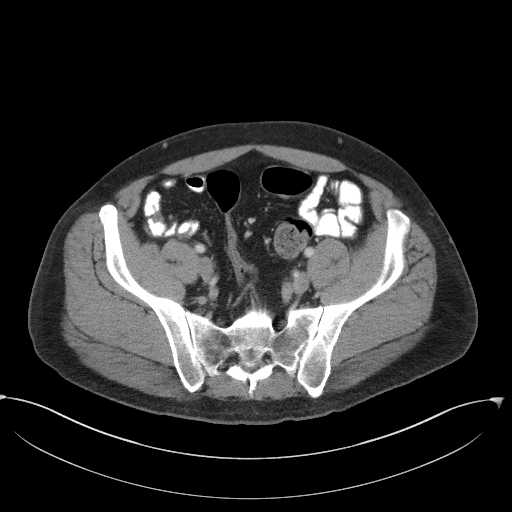
[im 34/96  soft-tissue]
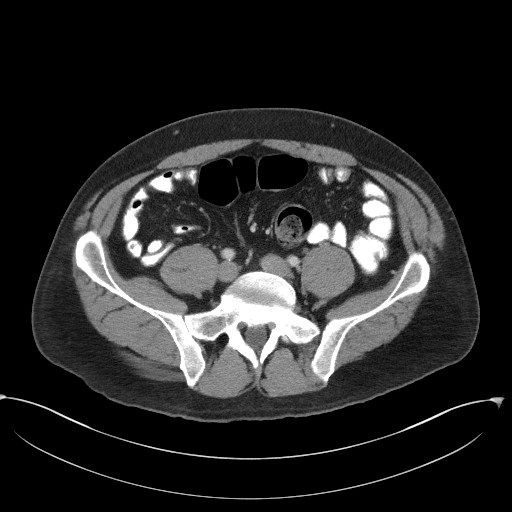
[im 41/96  soft-tissue]
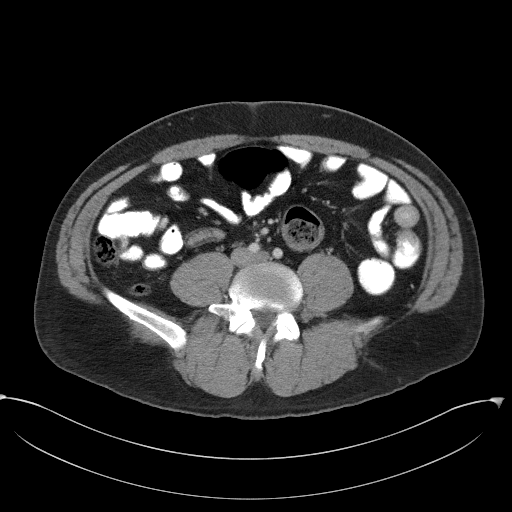
[im 48/96  soft-tissue]
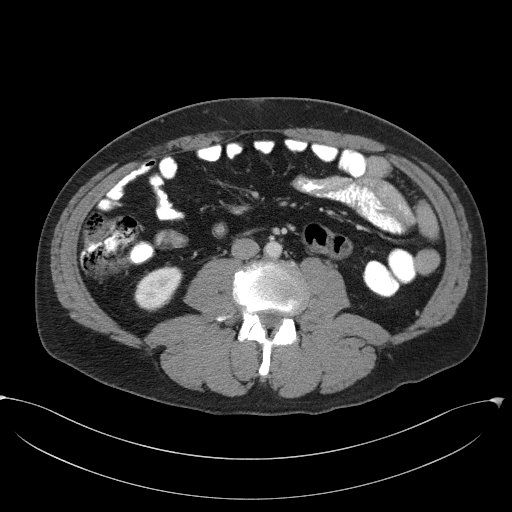
[im 55/96  soft-tissue]
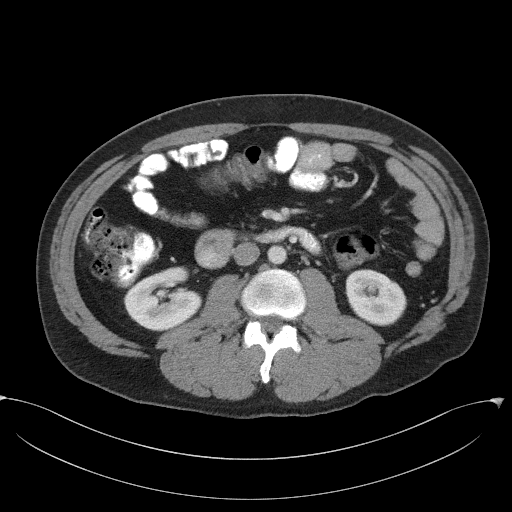
[im 62/96  soft-tissue]
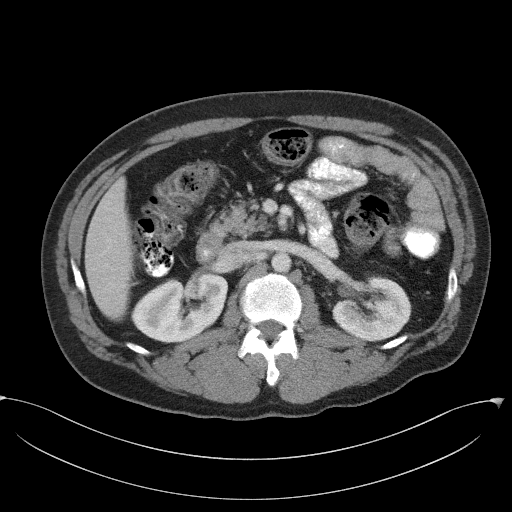
[im 62/96  bone]
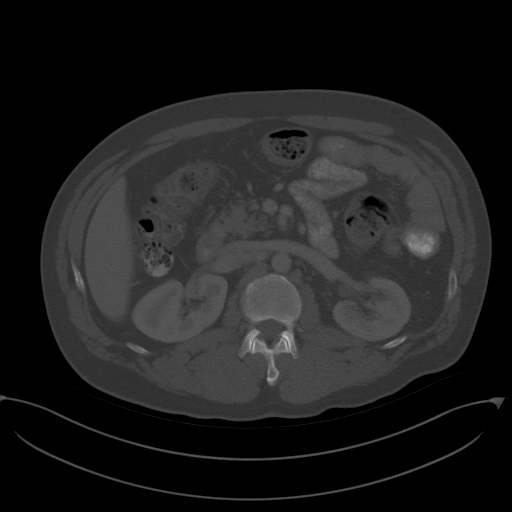
[im 68/96  soft-tissue]
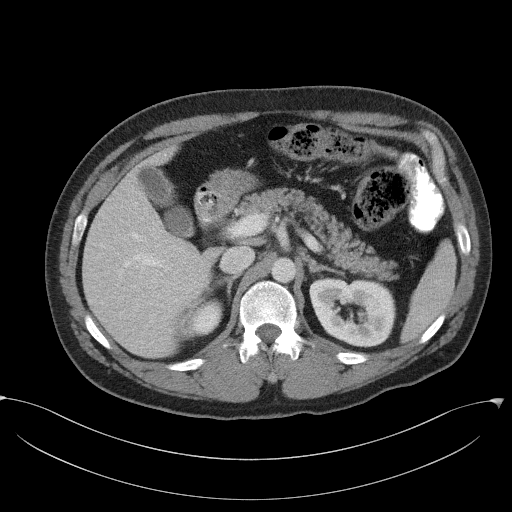
[im 75/96  soft-tissue]
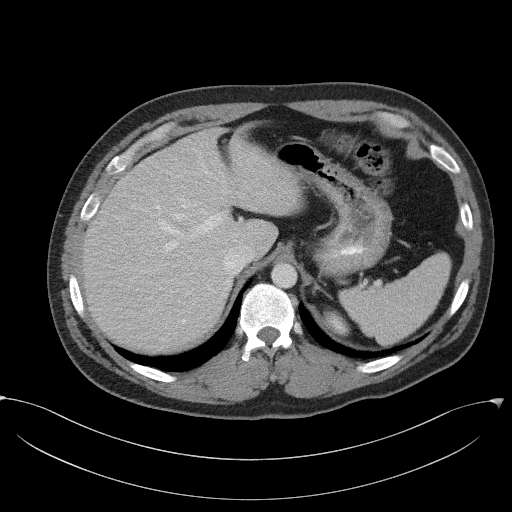
[im 82/96  soft-tissue]
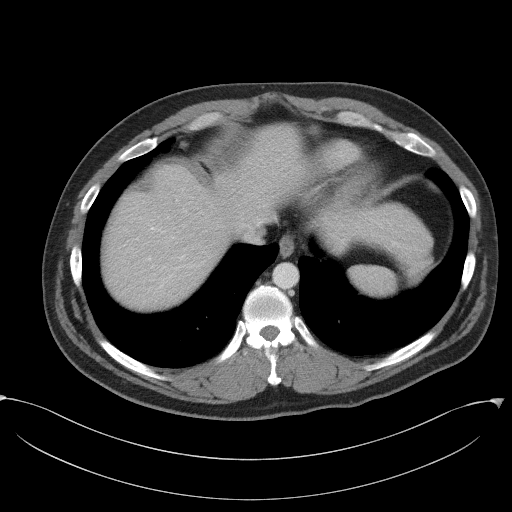
[im 89/96  soft-tissue]
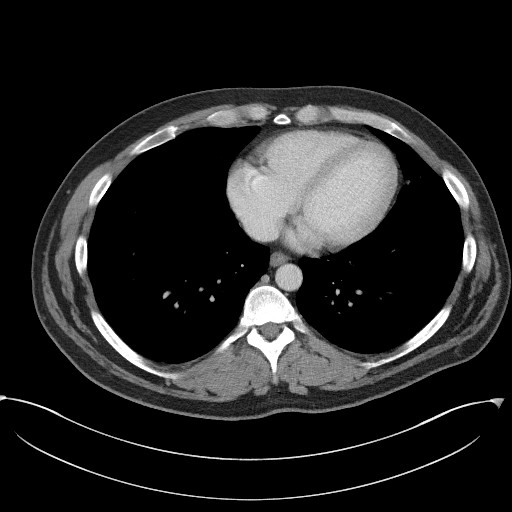

[Series 4: coronal st · coronal · 0.87mm/px · 3 of 96 slices shown]
[im 32/96  soft-tissue]
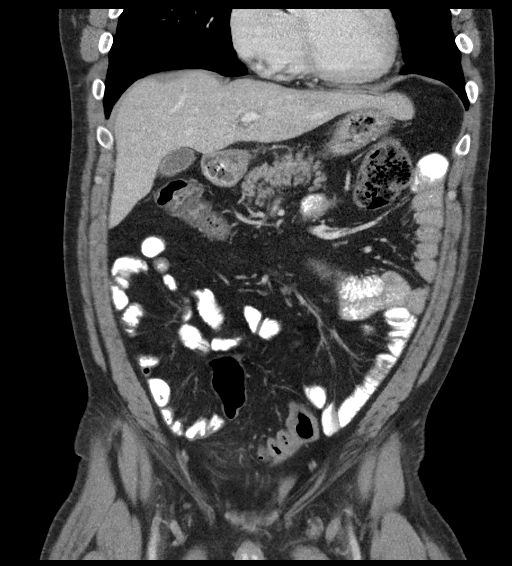
[im 43/96  soft-tissue]
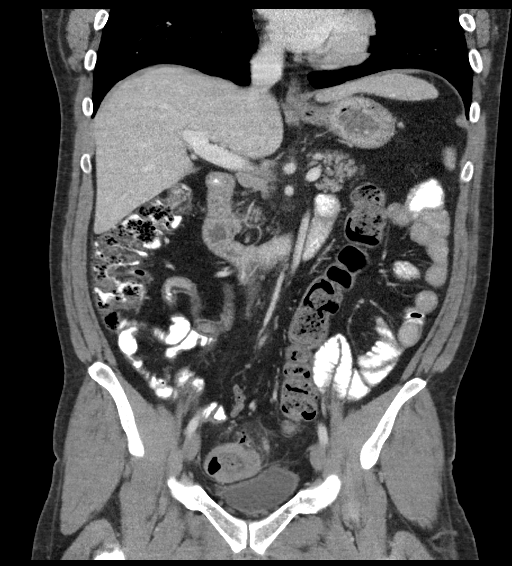
[im 53/96  soft-tissue]
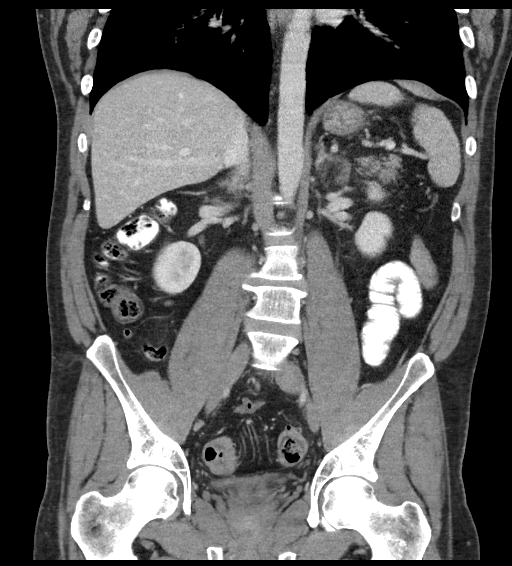

[16 of 46 positions shown; findings below may reference images not displayed]

FINDINGS: Lower chest: The lung bases are clear of acute process. No pleural
effusion or pulmonary lesions. The heart is normal in size. No
pericardial effusion. The distal esophagus and aorta are
unremarkable.

Hepatobiliary: No focal hepatic lesions or intrahepatic biliary
dilatation. The gallbladder demonstrates mild wall irregularity
which appears stable and may be due to adenomyomatosis. No common
bile duct dilatation.

Pancreas: No mass, inflammation or ductal dilatation.

Spleen: Normal size.  No focal lesions.

Adrenals/Urinary Tract: The adrenal glands and kidneys are
unremarkable. The bladder appears normal.

Stomach/Bowel: The stomach, duodenum and small bowel are
unremarkable. No acute inflammatory changes, mass lesions or
obstructive findings. The terminal ileum and appendix are normal.

Improved CT appearance of the diverticulitis involving the mid
sigmoid colon. There is some persistent wall thickening and mild
pericolonic inflammatory changes. There are some persistent areas of
loculated air collections but overall definite improvement since the
prior CT scan. No worrisome progressive findings or abscess.

Vascular/Lymphatic: The aorta is normal in caliber. No dissection.
The branch vessels are patent. The major venous structures are
patent. No mesenteric or retroperitoneal mass or adenopathy. Small
scattered lymph nodes are noted.

Reproductive: The prostate gland and seminal vesicles are normal.

Other: No free pelvic fluid collections or pelvic adenopathy. No
inguinal mass, adenopathy or hernia.

Musculoskeletal: No significant bony findings.
IMPRESSION: 1. Improving complicated mid sigmoid colon diverticulitis with
resolving pericolonic air collections and no findings for abscess.
2. No other significant abdominal/pelvic findings.

## 2019-09-03 MED ORDER — SODIUM CHLORIDE (PF) 0.9 % IJ SOLN
INTRAMUSCULAR | Status: AC
  Filled 2019-09-03: qty 50

## 2019-09-03 MED ORDER — IOHEXOL 300 MG/ML  SOLN
100.0000 mL | Freq: Once | INTRAMUSCULAR | Status: AC | PRN
  Administered 2019-09-03: 100 mL via INTRAVENOUS

## 2019-09-19 ENCOUNTER — Ambulatory Visit: Payer: Self-pay | Admitting: Physician Assistant

## 2019-09-22 ENCOUNTER — Encounter: Payer: Self-pay | Admitting: Physician Assistant

## 2019-09-22 ENCOUNTER — Ambulatory Visit (INDEPENDENT_AMBULATORY_CARE_PROVIDER_SITE_OTHER): Payer: Self-pay | Admitting: Physician Assistant

## 2019-09-22 VITALS — BP 140/98 | HR 80 | Temp 97.8°F | Ht 72.0 in | Wt 223.4 lb

## 2019-09-22 DIAGNOSIS — K5732 Diverticulitis of large intestine without perforation or abscess without bleeding: Secondary | ICD-10-CM

## 2019-09-22 DIAGNOSIS — Z8719 Personal history of other diseases of the digestive system: Secondary | ICD-10-CM

## 2019-09-22 NOTE — Patient Instructions (Signed)
If you are age 41 or older, your body mass index should be between 23-30. Your Body mass index is 30.3 kg/m. If this is out of the aforementioned range listed, please consider follow up with your Primary Care Provider.  If you are age 87 or younger, your body mass index should be between 19-25. Your Body mass index is 30.3 kg/m. If this is out of the aformentioned range listed, please consider follow up with your Primary Care Provider.   You have been scheduled for a colonoscopy. Please follow written instructions given to you at your visit today.  Please pick up your prep supplies at the pharmacy within the next 1-3 days. If you use inhalers (even only as needed), please bring them with you on the day of your procedure. Your physician has requested that you go to www.startemmi.com and enter the access code given to you at your visit today. This web site gives a general overview about your procedure. However, you should still follow specific instructions given to you by our office regarding your preparation for the procedure.

## 2019-09-24 ENCOUNTER — Encounter: Payer: Self-pay | Admitting: Physician Assistant

## 2019-09-24 NOTE — Progress Notes (Signed)
Subjective:    Patient ID: Eric Peck, male    DOB: 12/12/1975, 43 y.o.   MRN: [id number]  HPI Eric Peck is a pleasant 43 year old white male, known to Dr. Carlean Purl, who was seen by myself on 09/02/2019 for hospital follow-up after recent hospitalization with complicated diverticulitis with either perforated sigmoid diverticulitis versus focal perforation in area of colitis i.e. IBD. He was managed conservatively, treated with IV antibiotics and then completed a course of Augmentin at home. At that time patient says his symptoms recurred shortly after he completed the course of Augmentin and he had been having 10 days of abdominal pain chills, left back discomfort and frequent night sweats. He was started back on Augmentin 875 twice daily, and repeat CT of the abdomen and pelvis was obtained on 09/03/2019.  This showed improving complicated mid sigmoid diverticulitis with resolving pericolonic air collection and no abscess.  He was noted to have irregularity of the gallbladder wall question adenomyomatosis and follow-up ultrasound recommended in a year  He comes back in today for follow-up, stating that he is feeling much better. He has not had any further chills sweats or fever.  Appetite has improved and he is eating without difficulty. Bowel movements have been fairly normal.  He says he gets very occasional mild cramping in the lower abdomen which resolves quickly.  Patient also had an episode of complicated diverticulitis in 2016.  Around that time he had undergone a colonoscopy by Dr. Elmo Putt Thomas/surgery and was noted to have congested mucosa in the sigmoid colon, no biopsies were taken.  Plan after recent hospitalization was for patient to have full colonoscopy once this episode resolved.  Review of Systems.Pertinent positive and negative review of systems were noted in the above HPI section.  All other review of systems was otherwise negative.  Outpatient Encounter Medications as of  09/22/2019  Medication Sig  . acetaminophen (TYLENOL) 325 MG tablet Take 650 mg by mouth every 6 (six) hours as needed.  . [DISCONTINUED] amoxicillin-clavulanate (AUGMENTIN) 875-125 MG tablet Take 1 tablet by mouth 2 (two) times daily. Take twice daily for 14 days.   No facility-administered encounter medications on file as of 09/22/2019.   No Known Allergies Patient Active Problem List   Diagnosis Date Noted  . GERD (gastroesophageal reflux disease)   . Leukocytosis   . Hypertension 11/06/2016  . Tobacco abuse 05/24/2016  . Constipation 05/24/2016  . Chronic RUQ pain 04/12/2016  . Diverticulitis of colon 03/12/2015   Social History   Socioeconomic History  . Marital status: Married    Spouse name: Not on file  . Number of children: Not on file  . Years of education: Not on file  . Highest education level: Not on file  Occupational History  . Not on file  Tobacco Use  . Smoking status: Former Smoker    Packs/day: 0.50    Types: Cigarettes  . Smokeless tobacco: Never Used  . Tobacco comment: form given 08-02-16  Substance and Sexual Activity  . Alcohol use: No  . Drug use: Yes    Frequency: 5.0 times per week    Types: Marijuana    Comment: or more  . Sexual activity: Not on file  Other Topics Concern  . Not on file  Social History Narrative   Married 1 son a1 daughter   Unemployed carpenter   Smoker   4 caffeine/day   Frequent marijuana   08/02/2016   Social Determinants of Health   Financial Resource Strain:   .  Difficulty of Paying Living Expenses: Not on file  Food Insecurity:   . Worried About Charity fundraiser in the Last Year: Not on file  . Ran Out of Food in the Last Year: Not on file  Transportation Needs:   . Lack of Transportation (Medical): Not on file  . Lack of Transportation (Non-Medical): Not on file  Physical Activity:   . Days of Exercise per Week: Not on file  . Minutes of Exercise per Session: Not on file  Stress:   . Feeling of  Stress : Not on file  Social Connections:   . Frequency of Communication with Friends and Family: Not on file  . Frequency of Social Gatherings with Friends and Family: Not on file  . Attends Religious Services: Not on file  . Active Member of Clubs or Organizations: Not on file  . Attends Archivist Meetings: Not on file  . Marital Status: Not on file  Intimate Partner Violence:   . Fear of Current or Ex-Partner: Not on file  . Emotionally Abused: Not on file  . Physically Abused: Not on file  . Sexually Abused: Not on file    Eric Peck's family history includes Alcohol abuse in his mother; Breast cancer in his maternal grandmother and paternal grandmother; COPD in his mother; Colitis in his maternal grandmother; Crohn's disease in his cousin; Cystic fibrosis in his mother; Diabetes in his father; Eczema in his brother; Inflammatory bowel disease in his mother; Irritable bowel syndrome in his brother and mother; Kidney disease in his father; Mental retardation in his brother; Prostate cancer in his paternal uncle.      Objective:    Vitals:   09/22/19 1031  BP: (!) 140/98  Pulse: 80  Temp: 97.8 F (36.6 C)    Physical Exam Well-developed well-nourished white male in no acute distress.  Height, Weight 223, BMI 30.3  HEENT; nontraumatic normocephalic, EOMI, PE RR LA, sclera anicteric. Oropharynx; not examined/mask/Covid Neck; supple, no JVD Cardiovascular; regular rate and rhythm with S1-S2, no murmur rub or gallop Pulmonary; Clear bilaterally Abdomen; soft, , nondistended, there is minimal tenderness in the left lower quadrant, no guarding or rebound no palpable mass, no  hepatosplenomegaly, bowel sounds are active Rectal; not done today Skin; benign exam, no jaundice rash or appreciable lesions Extremities; no clubbing cyanosis or edema skin warm and dry Neuro/Psych; alert and oriented x4, grossly nonfocal mood and affect appropriate       Assessment & Plan:     #63 43 year old white male with recurrent complicated diverticulitis, now resolved  Most recent CT scan 09/03/2019 with improving complicated mid sigmoid diverticulitis with resolving pericolonic air collection and no abscess Patient has just completed another 14 days of Augmentin.  #2 GERD 3.  Hypertension  Plan; Patient is asked to monitor symptoms and call for any recurrence of abdominal pain chills or sweats. I do not think he needs any further antibiotics at this time. Regular diet as tolerated. Patient will be scheduled for colonoscopy with Dr. Carlean Purl in mid-to-late January.  Procedure was discussed in detail with the patient including indications risks and benefits and he is agreeable to proceed.  Taylen Wendland Genia Harold PA-C 09/24/2019   Cc: Trey Sailors, PA

## 2019-10-15 ENCOUNTER — Other Ambulatory Visit: Payer: Self-pay | Admitting: Gastroenterology

## 2019-10-15 ENCOUNTER — Ambulatory Visit (INDEPENDENT_AMBULATORY_CARE_PROVIDER_SITE_OTHER): Payer: Self-pay

## 2019-10-15 DIAGNOSIS — Z1159 Encounter for screening for other viral diseases: Secondary | ICD-10-CM

## 2019-10-15 LAB — SARS CORONAVIRUS 2 (TAT 6-24 HRS): SARS Coronavirus 2: NEGATIVE

## 2019-10-17 ENCOUNTER — Encounter: Payer: Self-pay | Admitting: Internal Medicine

## 2019-10-17 ENCOUNTER — Ambulatory Visit (AMBULATORY_SURGERY_CENTER): Payer: Self-pay | Admitting: Internal Medicine

## 2019-10-17 ENCOUNTER — Other Ambulatory Visit: Payer: Self-pay

## 2019-10-17 VITALS — BP 105/56 | HR 62 | Temp 98.4°F | Resp 13 | Ht 72.0 in | Wt 233.0 lb

## 2019-10-17 DIAGNOSIS — K5730 Diverticulosis of large intestine without perforation or abscess without bleeding: Secondary | ICD-10-CM

## 2019-10-17 DIAGNOSIS — Z8719 Personal history of other diseases of the digestive system: Secondary | ICD-10-CM

## 2019-10-17 MED ORDER — SODIUM CHLORIDE 0.9 % IV SOLN: 500.0000 mL | Freq: Once | INTRAVENOUS | Status: DC

## 2019-10-17 NOTE — Progress Notes (Signed)
Pt. Reports that since his last perforation pt. Continually notes pressure/discomfort in superior abdominal cavity.   Heat application or external counter pressure relieves  Discomfort.  Pt. Also notes not eating fatty food lessens awareness of discomfort.

## 2019-10-17 NOTE — Progress Notes (Signed)
Pt toelrated well. VSS. Awake and to recovery.

## 2019-10-17 NOTE — Op Note (Signed)
[place] Patient Name: Harinder Lopeman Procedure Date: 10/17/2019 11:01 AM MRN: [id number] Endoscopist: Gatha Mayer , MD Age: 44 Referring MD:  Date of Birth: 06-23-1976 Gender: Male Account #: [id number] Procedure:                Colonoscopy Indications:              Follow-up of diverticulitis Medicines:                Propofol per Anesthesia, Monitored Anesthesia Care Procedure:                Pre-Anesthesia Assessment:                           - Prior to the procedure, a History and Physical                            was performed, and patient medications and                            allergies were reviewed. The patient's tolerance of                            previous anesthesia was also reviewed. The risks                            and benefits of the procedure and the sedation                            options and risks were discussed with the patient.                            All questions were answered, and informed consent                            was obtained. Prior Anticoagulants: The patient has                            taken no previous anticoagulant or antiplatelet                            agents. ASA Grade Assessment: II - A patient with                            mild systemic disease. After reviewing the risks                            and benefits, the patient was deemed in                            satisfactory condition to undergo the procedure.                           After obtaining informed consent, the colonoscope  was passed under direct vision. Throughout the                            procedure, the patient's blood pressure, pulse, and                            oxygen saturations were monitored continuously. The                            Colonoscope was introduced through the anus and                            advanced to the the cecum, identified by                            appendiceal orifice  and ileocecal valve. The                            colonoscopy was performed without difficulty. The                            patient tolerated the procedure well. The quality                            of the bowel preparation was good. The ileocecal                            valve, appendiceal orifice, and rectum were                            photographed. The bowel preparation used was                            Miralax via split dose instruction. Scope In: 11:17:27 AM Scope Out: 11:32:43 AM Scope Withdrawal Time: 0 hours 9 minutes 57 seconds  Total Procedure Duration: 0 hours 15 minutes 16 seconds  Findings:                 The perianal and digital rectal examinations were                            normal. Pertinent negatives include normal prostate                            (size, shape, and consistency).                           Multiple diverticula were found in the sigmoid                            colon. There was narrowing of the colon in                            association with the diverticular opening.  The exam was otherwise without abnormality on                            direct and retroflexion views. Complications:            No immediate complications. Estimated Blood Loss:     Estimated blood loss: none. Impression:               - Severe diverticulosis in the sigmoid colon. There                            was narrowing of the colon in association with the                            diverticular opening.                           - The examination was otherwise normal on direct                            and retroflexion views.                           - No specimens collected. Recommendation:           - Patient has a contact number available for                            emergencies. The signs and symptoms of potential                            delayed complications were discussed with the                            patient.  Return to normal activities tomorrow.                            Written discharge instructions were provided to the                            patient.                           - Resume previous diet.                           - Continue present medications.                           - Repeat colonoscopy in 10 years for screening                            purposes.                           - Make an appointment to see Dr. Marcello Moores about  having surgery to prevent furthjer attacks of                            diverticulitis - patient to call Gatha Mayer, MD 10/17/2019 11:45:29 AM This report has been signed electronically.

## 2019-10-17 NOTE — Patient Instructions (Addendum)
Handout given: Diverticulosis Continue present medications Resume previous diet MAKE AN APPOINTMENT TO SEE DR. THOMAS TO PREVENT FURTHER ATTACKS OF DIVERTICULITIS  I saw diverticulosis again.  No polyps, no cancer.  Since you have had diverticulitis with complications multiple times I believe you should have surgery to remove the area of the colon where this occurs.  You need to call Dr. Marcello Moores at [facility] and make a follow-up appointment if you do not have one already.  [phone].  Try to quit smoking again also.   Next routine colonoscopy or other screening test in 10 years - 2031  I appreciate the opportunity to care for you. Gatha Mayer, MD, FACG YOU HAD AN ENDOSCOPIC PROCEDURE TODAY AT [facility]:   Refer to the procedure report that was given to you for any specific questions about what was found during the examination.  If the procedure report does not answer your questions, please call your gastroenterologist to clarify.  If you requested that your care partner not be given the details of your procedure findings, then the procedure report has been included in a sealed envelope for you to review at your convenience later.  YOU SHOULD EXPECT: Some feelings of bloating in the abdomen. Passage of more gas than usual.  Walking can help get rid of the air that was put into your GI tract during the procedure and reduce the bloating. If you had a lower endoscopy (such as a colonoscopy or flexible sigmoidoscopy) you may notice spotting of blood in your stool or on the toilet paper. If you underwent a bowel prep for your procedure, you may not have a normal bowel movement for a few days.  Please Note:  You might notice some irritation and congestion in your nose or some drainage.  This is from the oxygen used during your procedure.  There is no need for concern and it should clear up in a day or so.  SYMPTOMS TO REPORT IMMEDIATELY:   Following  lower endoscopy (colonoscopy or flexible sigmoidoscopy):  Excessive amounts of blood in the stool  Significant tenderness or worsening of abdominal pains  Swelling of the abdomen that is new, acute  Fever of 100F or higher   For urgent or emergent issues, a gastroenterologist can be reached at any hour by calling [phone].   DIET:  We do recommend a small meal at first, but then you may proceed to your regular diet.  Drink plenty of fluids but you should avoid alcoholic beverages for 24 hours.  ACTIVITY:  You should plan to take it easy for the rest of today and you should NOT DRIVE or use heavy machinery until tomorrow (because of the sedation medicines used during the test).    FOLLOW UP: Our staff will call the number listed on your records 48-72 hours following your procedure to check on you and address any questions or concerns that you may have regarding the information given to you following your procedure. If we do not reach you, we will leave a message.  We will attempt to reach you two times.  During this call, we will ask if you have developed any symptoms of COVID 19. If you develop any symptoms (ie: fever, flu-like symptoms, shortness of breath, cough etc.) before then, please call [phone].  If you test positive for Covid 19 in the 2 weeks post procedure, please call and report this information to [place].    If any biopsies were taken you  will be contacted by phone or by letter within the next 1-3 weeks.  Please call us at [phone] if you have not heard about the biopsies in 3 weeks.    SIGNATURES/CONFIDENTIALITY: You and/or your care partner have signed paperwork which will be entered into your electronic medical record.  These signatures attest to the fact that that the information above on your After Visit Summary has been reviewed and is understood.  Full responsibility of the confidentiality of this discharge information lies with you and/or your care-partner.

## 2019-10-21 ENCOUNTER — Telehealth: Payer: Self-pay

## 2019-10-21 NOTE — Telephone Encounter (Signed)
  Follow up Call-  Call back number 10/17/2019  Post procedure Call Back phone  # [phone]  Permission to leave phone message Yes  Some recent data might be hidden     Patient questions:  Do you have a fever, pain , or abdominal swelling? No. Pain Score  0 *  Have you tolerated food without any problems? Yes.    Have you been able to return to your normal activities? Yes.    Do you have any questions about your discharge instructions: Diet   No. Medications  No. Follow up visit  No.  Do you have questions or concerns about your Care? No.  Actions: * If pain score is 4 or above: No action needed, pain <4. 1. Have you developed a fever since your procedure? no  2.   Have you had an respiratory symptoms (SOB or cough) since your procedure? no  3.   Have you tested positive for COVID 19 since your procedure no  4.   Have you had any family members/close contacts diagnosed with the COVID 19 since your procedure?  no   If yes to any of these questions please route to Joylene John, RN and Alphonsa Gin, Therapist, sports.

## 2020-09-01 ENCOUNTER — Other Ambulatory Visit: Payer: Self-pay

## 2020-09-01 DIAGNOSIS — D135 Benign neoplasm of extrahepatic bile ducts: Secondary | ICD-10-CM

## 2020-09-10 ENCOUNTER — Ambulatory Visit (HOSPITAL_COMMUNITY)
Admission: RE | Admit: 2020-09-10 | Discharge: 2020-09-10 | Disposition: A | Discharge status: Home / Self Care | Payer: Self-pay | Source: Ambulatory Visit | Attending: Physician Assistant | Admitting: Physician Assistant

## 2020-09-10 ENCOUNTER — Other Ambulatory Visit: Payer: Self-pay

## 2020-09-10 DIAGNOSIS — D135 Benign neoplasm of extrahepatic bile ducts: Secondary | ICD-10-CM | POA: Insufficient documentation

## 2020-09-10 IMAGING — US US ABDOMEN LIMITED
1 series · 14 of 25 positions shown · non-contrast
Comparison: [DATE], [DATE]

CLINICAL DATA: Adenomyomatosis

EXAM:
ULTRASOUND ABDOMEN LIMITED RIGHT UPPER QUADRANT

[Series 1: us abdomen limited · 14 of 35 slices shown]
[im 1/35]
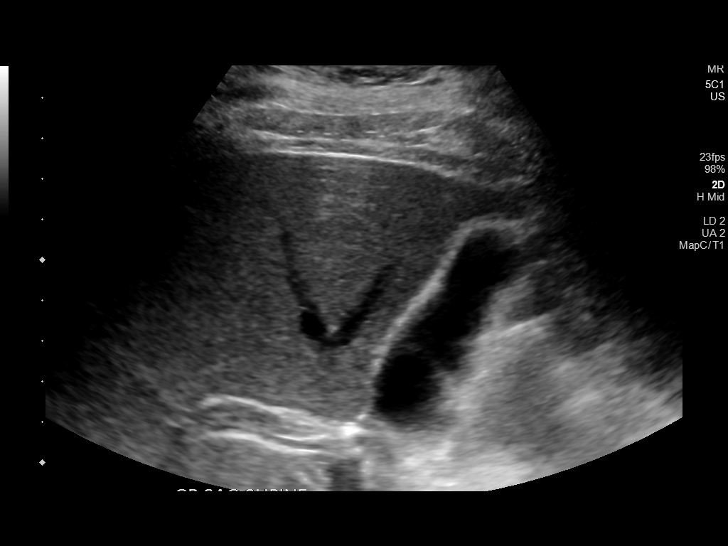
[im 3/35]
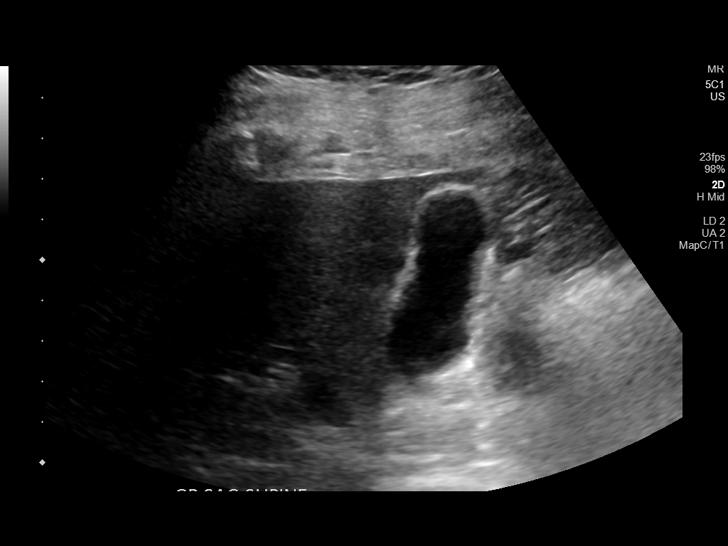
[im 6/35]
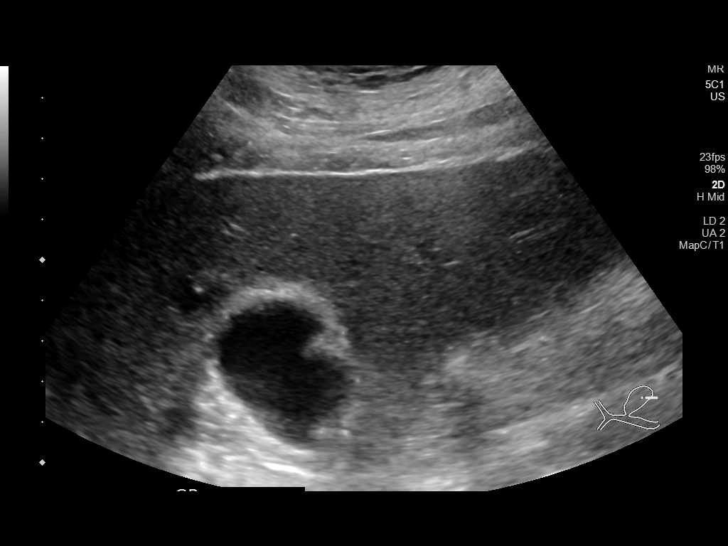
[im 9/35]
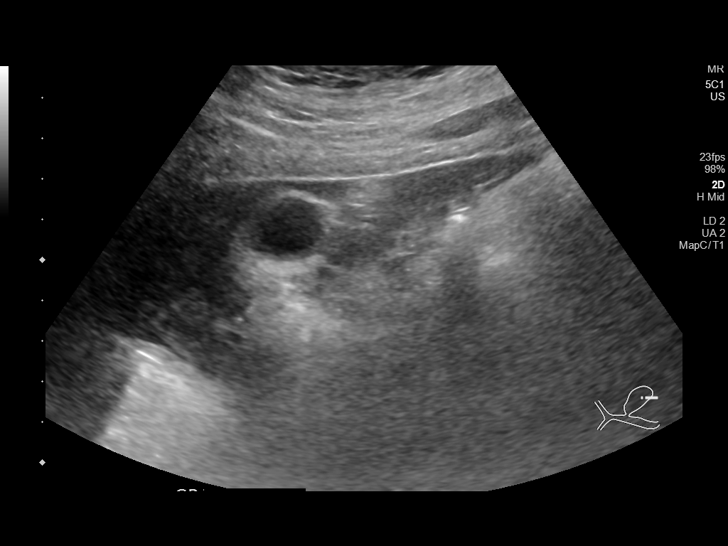
[im 12/35]
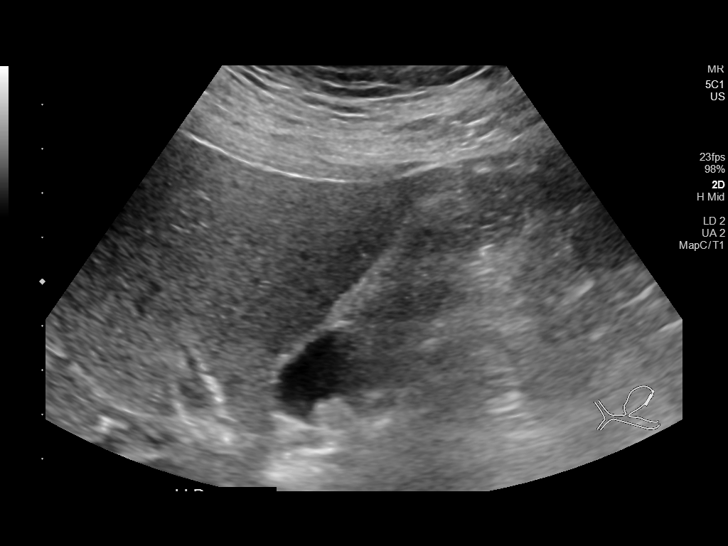
[im 13/35]
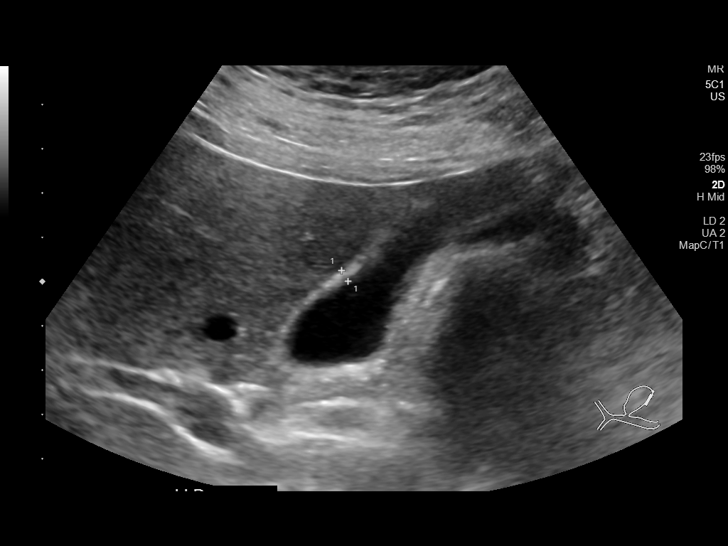
[im 16/35]
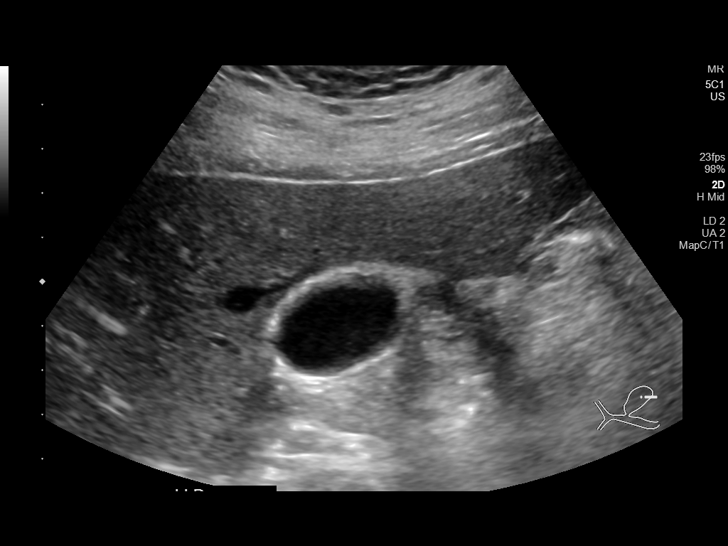
[im 19/35]
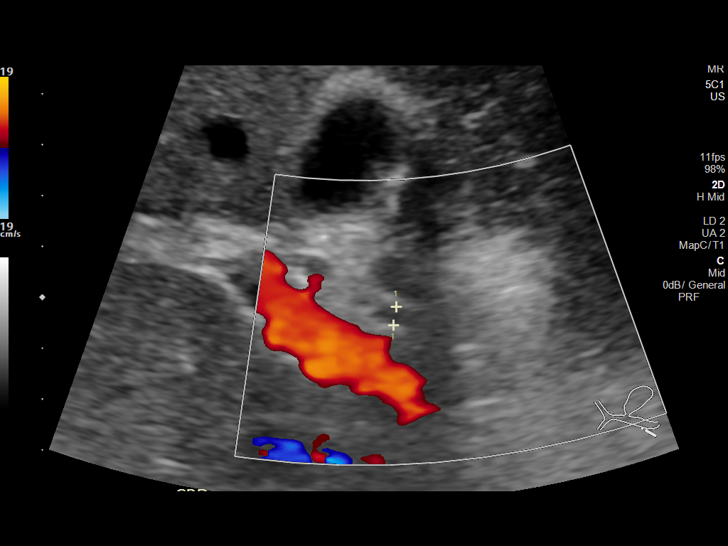
[im 22/35]
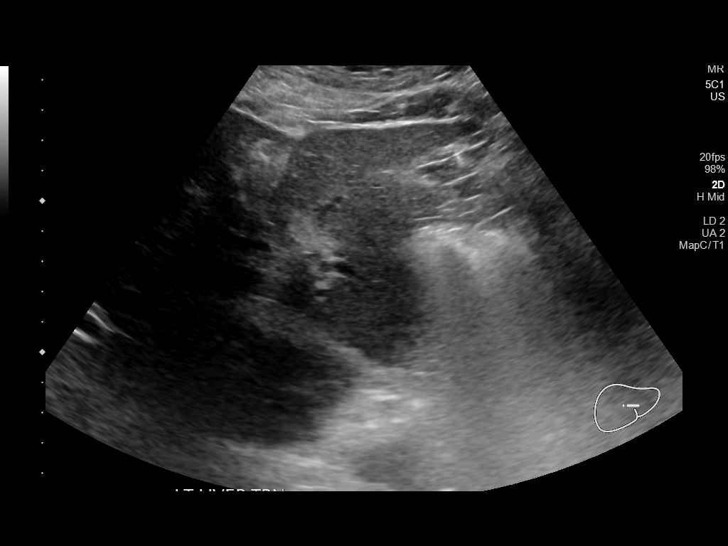
[im 23/35]
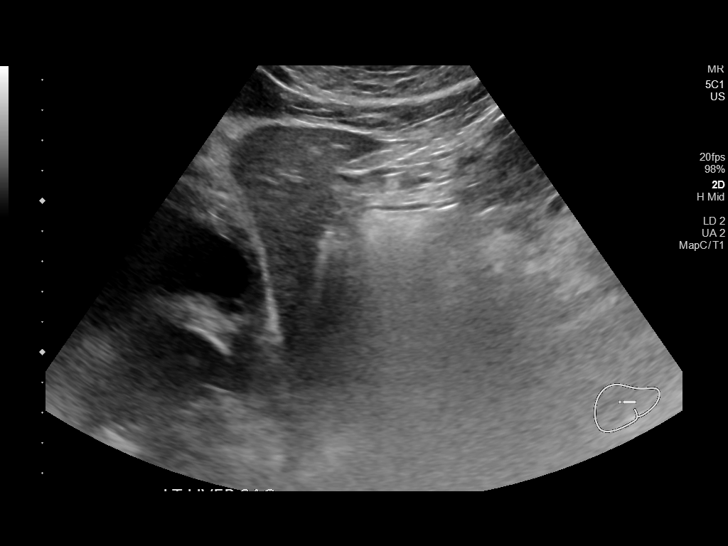
[im 26/35]
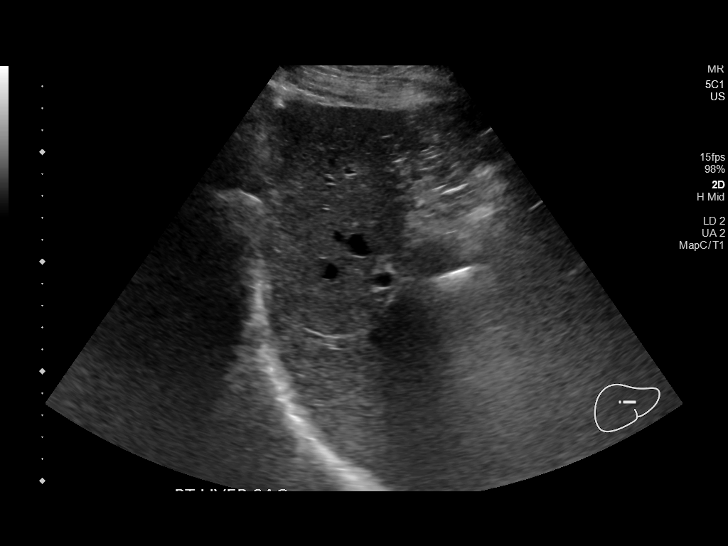
[im 29/35]
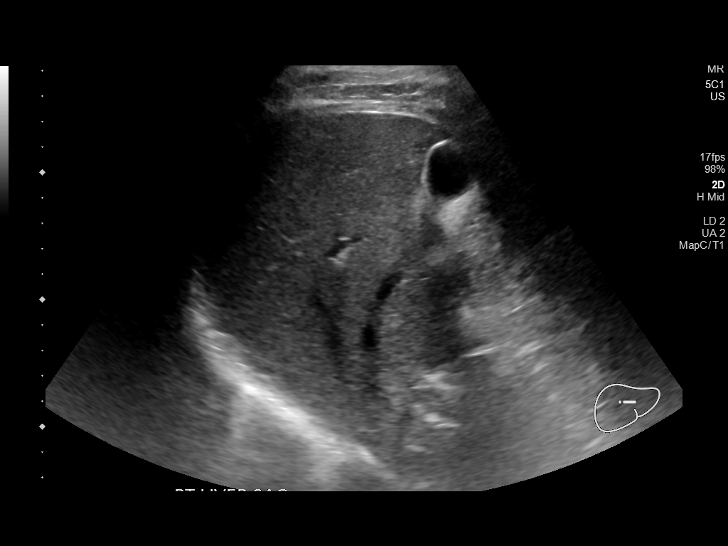
[im 32/35]
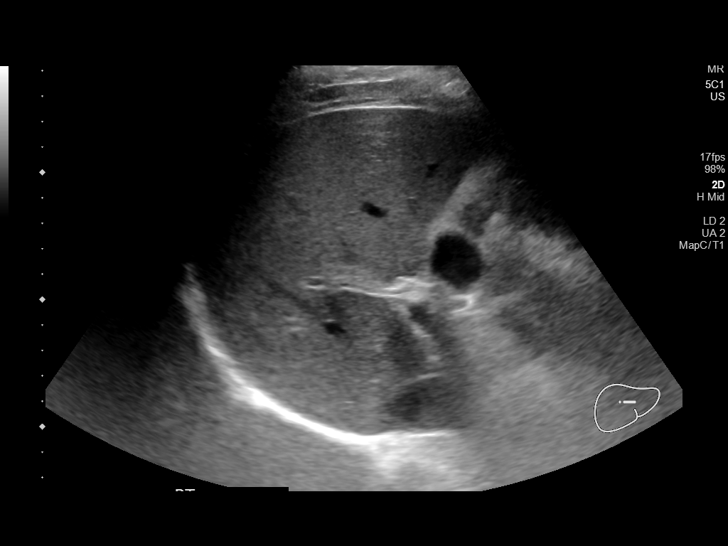
[im 35/35]
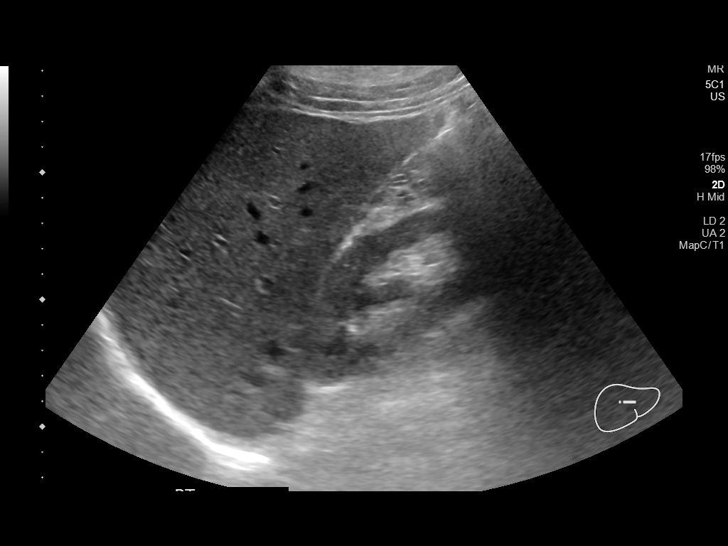

[14 of 25 positions shown; findings below may reference images not displayed]

FINDINGS: Gallbladder:

Borderline gallbladder wall thickening measuring nearly 2.8 mm is
less prominent than prior study. No cholelithiasis. Negative
sonographic Murphy sign.

Common bile duct:

Diameter: 4 mm

Liver:

No focal lesion identified. Within normal limits in parenchymal
echogenicity. Portal vein is patent on color Doppler imaging with
normal direction of blood flow towards the liver.

Other: None.
IMPRESSION: 1. Borderline gallbladder wall thickening without additional
abnormality. This is a nonspecific finding but improved since prior
study.
2. Otherwise unremarkable exam.

## 2021-08-02 ENCOUNTER — Emergency Department (HOSPITAL_COMMUNITY): Payer: PRIVATE HEALTH INSURANCE

## 2021-08-02 ENCOUNTER — Other Ambulatory Visit: Payer: Self-pay

## 2021-08-02 ENCOUNTER — Emergency Department (HOSPITAL_COMMUNITY)
Admission: EM | Admit: 2021-08-02 | Discharge: 2021-08-03 | Disposition: A | Discharge status: Home / Self Care | Payer: PRIVATE HEALTH INSURANCE | Attending: Emergency Medicine | Admitting: Emergency Medicine

## 2021-08-02 ENCOUNTER — Encounter (HOSPITAL_COMMUNITY): Payer: Self-pay | Admitting: Emergency Medicine

## 2021-08-02 DIAGNOSIS — I10 Essential (primary) hypertension: Secondary | ICD-10-CM | POA: Diagnosis not present

## 2021-08-02 DIAGNOSIS — W11XXXA Fall on and from ladder, initial encounter: Secondary | ICD-10-CM | POA: Diagnosis not present

## 2021-08-02 DIAGNOSIS — Z87891 Personal history of nicotine dependence: Secondary | ICD-10-CM | POA: Diagnosis not present

## 2021-08-02 DIAGNOSIS — Z20822 Contact with and (suspected) exposure to covid-19: Secondary | ICD-10-CM | POA: Diagnosis not present

## 2021-08-02 DIAGNOSIS — M5126 Other intervertebral disc displacement, lumbar region: Secondary | ICD-10-CM | POA: Diagnosis not present

## 2021-08-02 DIAGNOSIS — T07XXXA Unspecified multiple injuries, initial encounter: Secondary | ICD-10-CM

## 2021-08-02 DIAGNOSIS — M545 Low back pain, unspecified: Secondary | ICD-10-CM | POA: Diagnosis present

## 2021-08-02 DIAGNOSIS — W19XXXA Unspecified fall, initial encounter: Secondary | ICD-10-CM

## 2021-08-02 DIAGNOSIS — R0781 Pleurodynia: Secondary | ICD-10-CM | POA: Insufficient documentation

## 2021-08-02 DIAGNOSIS — K219 Gastro-esophageal reflux disease without esophagitis: Secondary | ICD-10-CM | POA: Insufficient documentation

## 2021-08-02 DIAGNOSIS — R109 Unspecified abdominal pain: Secondary | ICD-10-CM | POA: Diagnosis not present

## 2021-08-02 DIAGNOSIS — M5020 Other cervical disc displacement, unspecified cervical region: Secondary | ICD-10-CM | POA: Insufficient documentation

## 2021-08-02 DIAGNOSIS — R202 Paresthesia of skin: Secondary | ICD-10-CM | POA: Diagnosis not present

## 2021-08-02 LAB — I-STAT CHEM 8, ED
BUN: 7 mg/dL (ref 6–20)
Calcium, Ion: 1.15 mmol/L (ref 1.15–1.40)
Chloride: 106 mmol/L (ref 98–111)
Creatinine, Ser: 0.8 mg/dL (ref 0.61–1.24)
Glucose, Bld: 91 mg/dL (ref 70–99)
HCT: 44 % (ref 39.0–52.0)
Hemoglobin: 15 g/dL (ref 13.0–17.0)
Potassium: 3.3 mmol/L — ABNORMAL LOW (ref 3.5–5.1)
Sodium: 142 mmol/L (ref 135–145)
TCO2: 27 mmol/L (ref 22–32)

## 2021-08-02 LAB — RESP PANEL BY RT-PCR (FLU A&B, COVID) ARPGX2
Influenza A by PCR: NEGATIVE
Influenza B by PCR: NEGATIVE
SARS Coronavirus 2 by RT PCR: NEGATIVE

## 2021-08-02 LAB — URINALYSIS, ROUTINE W REFLEX MICROSCOPIC
Bilirubin Urine: NEGATIVE
Glucose, UA: NEGATIVE mg/dL
Hgb urine dipstick: NEGATIVE
Ketones, ur: NEGATIVE mg/dL
Leukocytes,Ua: NEGATIVE
Nitrite: NEGATIVE
Protein, ur: NEGATIVE mg/dL
Specific Gravity, Urine: >1.046 — ABNORMAL HIGH (ref 1.005–1.030)
pH: 8 (ref 5.0–8.0)

## 2021-08-02 LAB — COMPREHENSIVE METABOLIC PANEL
ALT: 15 U/L (ref 0–44)
AST: 18 U/L (ref 15–41)
Albumin: 4.6 g/dL (ref 3.5–5.0)
Alkaline Phosphatase: 49 U/L (ref 38–126)
Anion gap: 6 (ref 5–15)
BUN: 9 mg/dL (ref 6–20)
CO2: 24 mmol/L (ref 22–32)
Calcium: 9 mg/dL (ref 8.9–10.3)
Chloride: 107 mmol/L (ref 98–111)
Creatinine, Ser: 0.8 mg/dL (ref 0.61–1.24)
GFR, Estimated: >60 mL/min (ref >60)
Glucose, Bld: 93 mg/dL (ref 70–99)
Potassium: 3.2 mmol/L — ABNORMAL LOW (ref 3.5–5.1)
Sodium: 137 mmol/L (ref 135–145)
Total Bilirubin: 0.8 mg/dL (ref 0.3–1.2)
Total Protein: 7.5 g/dL (ref 6.5–8.1)

## 2021-08-02 LAB — SAMPLE TO BLOOD BANK

## 2021-08-02 LAB — CBC
HCT: 45.5 % (ref 39.0–52.0)
Hemoglobin: 15.5 g/dL (ref 13.0–17.0)
MCH: 29.5 pg (ref 26.0–34.0)
MCHC: 34.1 g/dL (ref 30.0–36.0)
MCV: 86.7 fL (ref 80.0–100.0)
Platelets: 215 10*3/uL (ref 150–400)
RBC: 5.25 MIL/uL (ref 4.22–5.81)
RDW: 13.4 % (ref 11.5–15.5)
WBC: 7.8 10*3/uL (ref 4.0–10.5)
nRBC: 0 % (ref 0.0–0.2)

## 2021-08-02 LAB — PROTIME-INR
INR: 1 (ref 0.8–1.2)
Prothrombin Time: 12.8 seconds (ref 11.4–15.2)

## 2021-08-02 LAB — ETHANOL: Alcohol, Ethyl (B): <10 mg/dL (ref <10)

## 2021-08-02 LAB — LACTIC ACID, PLASMA: Lactic Acid, Venous: 1.2 mmol/L (ref 0.5–1.9)

## 2021-08-02 IMAGING — CT CT L SPINE W/O CM
3 series · 12 of 33 positions shown, 14 images · IV contrast (OMNIPAQUE 350)
Comparison: None.

CLINICAL DATA: Polytrauma

EXAM:
CT THORACIC AND LUMBAR SPINE WITHOUT CONTRAST
TECHNIQUE: Multidetector CT imaging of the thoracic and lumbar spine was
performed without contrast. Multiplanar CT image reconstructions
were also generated.

[Series 1: ax lspine · axial · 0.37mm/px · z∈[-819,-595]mm · 4 of 162 slices shown, 5 images]
[im 25/162  soft-tissue]
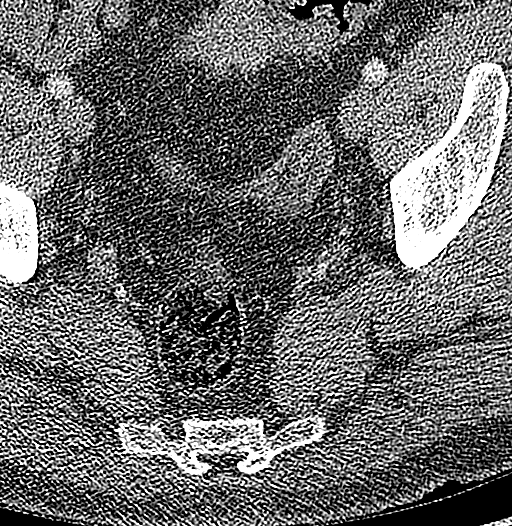
[im 25/162  bone]
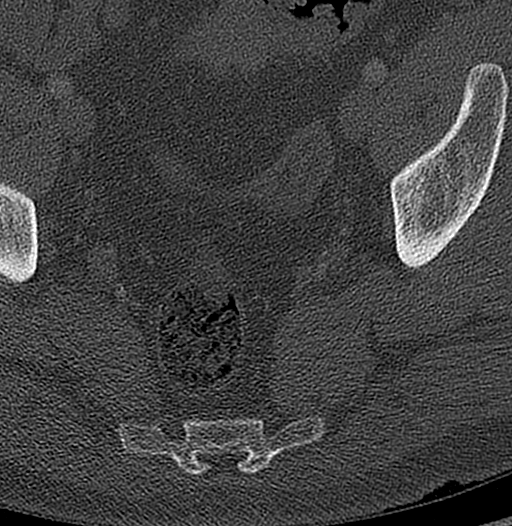
[im 62/162  bone]
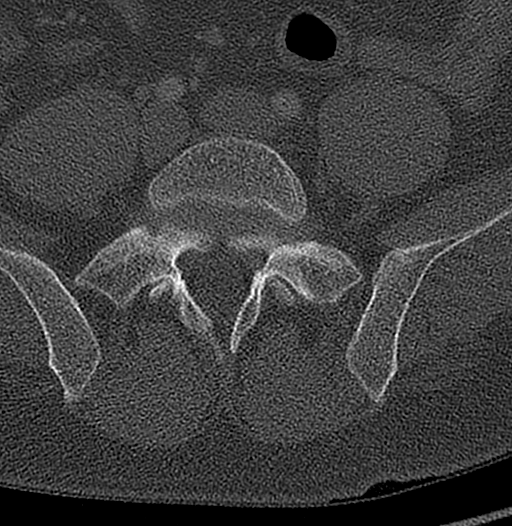
[im 100/162  bone]
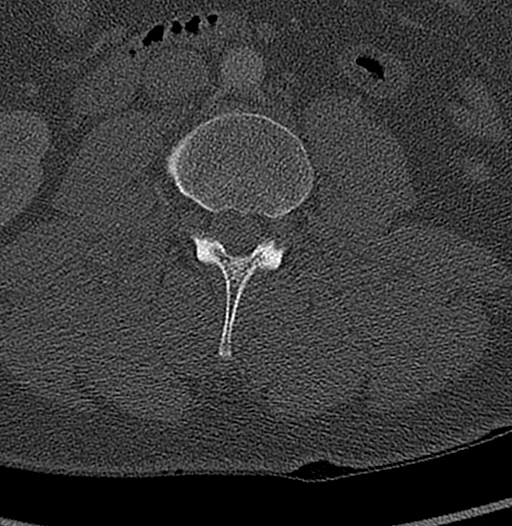
[im 137/162  bone]
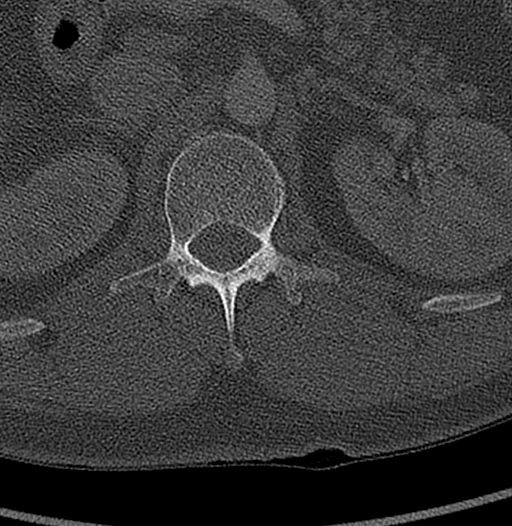

[Series 2: ax lspine cor · coronal · 0.37mm/px · 3 of 98 slices shown]
[im 20/98  bone]
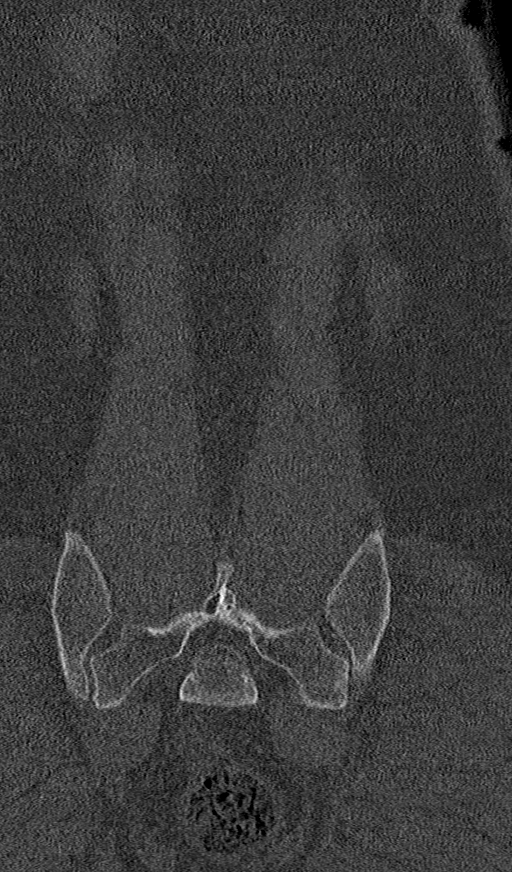
[im 39/98  bone]
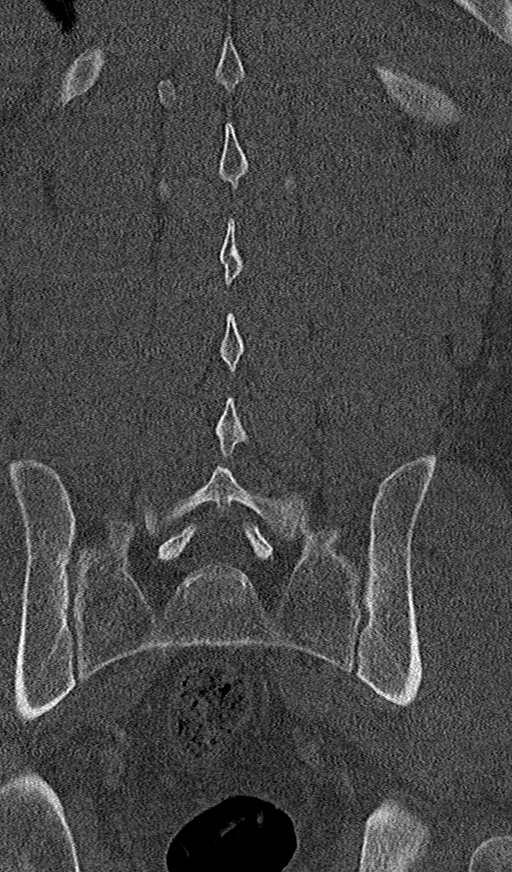
[im 59/98  bone]
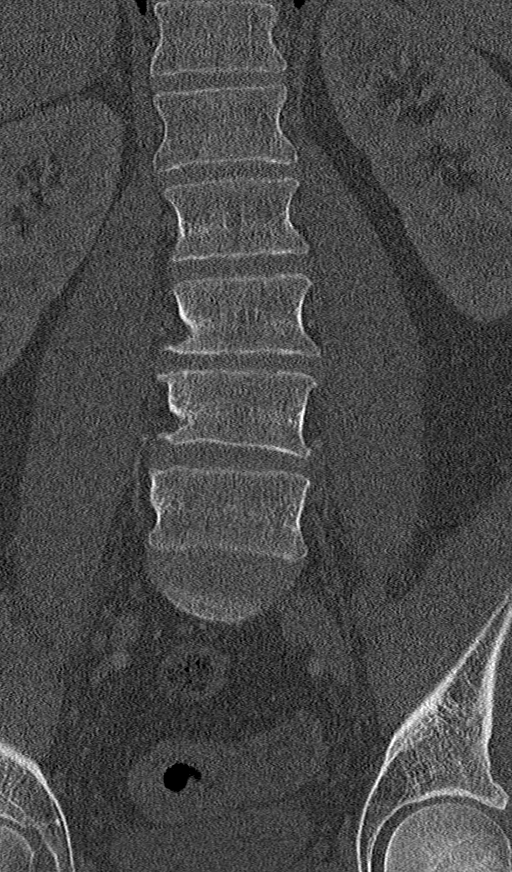

[Series 3: lspine sag · sagittal · 0.38mm/px · 5 of 95 slices shown, 6 images]
[im 32/95  bone]
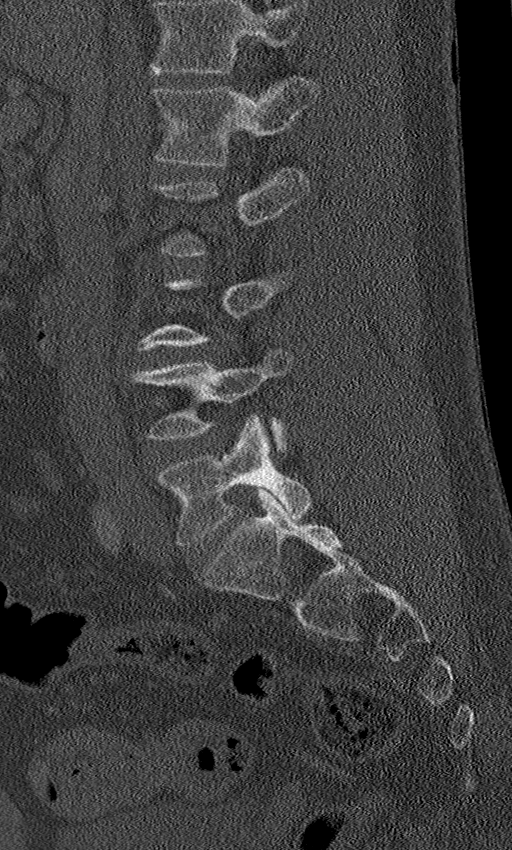
[im 40/95  bone]
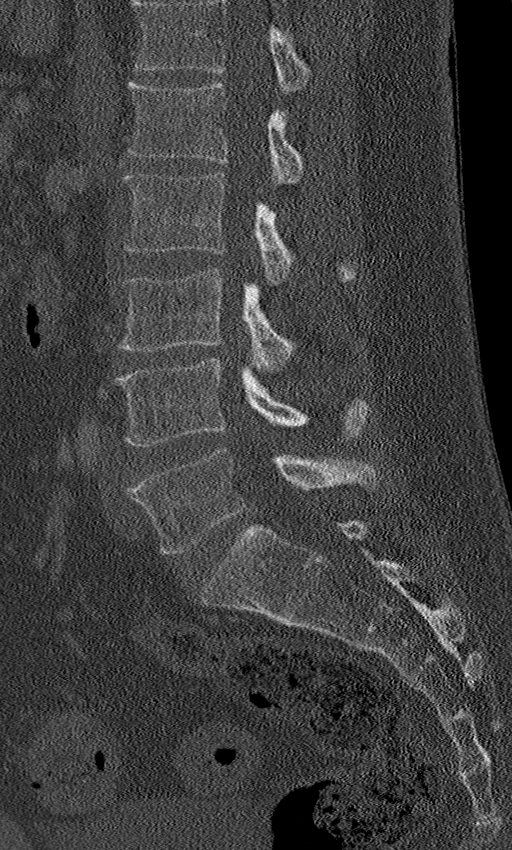
[im 48/95  soft-tissue]
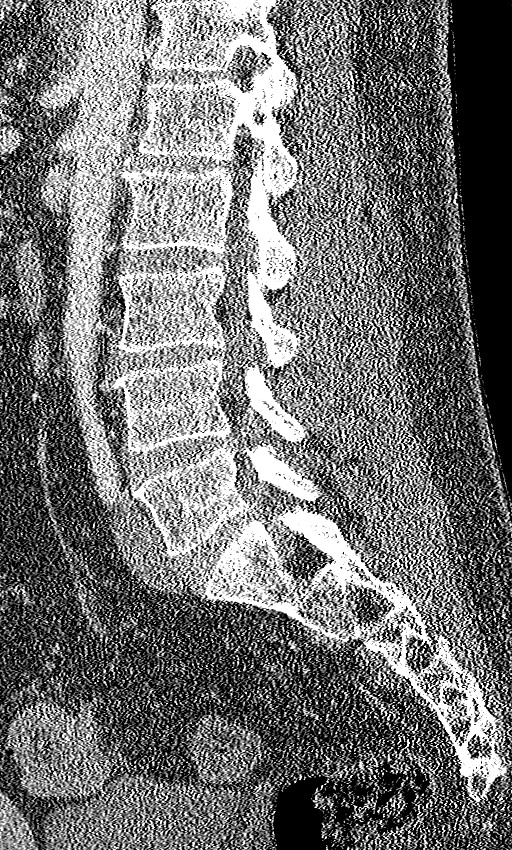
[im 48/95  bone]
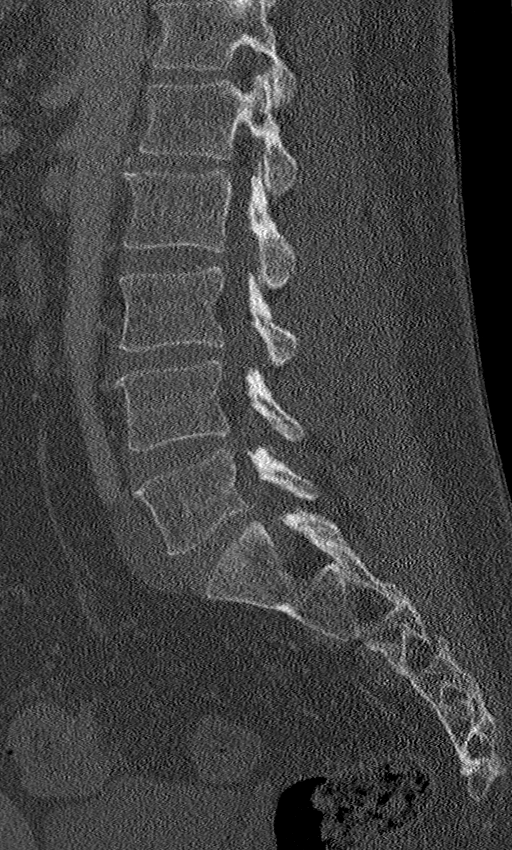
[im 55/95  bone]
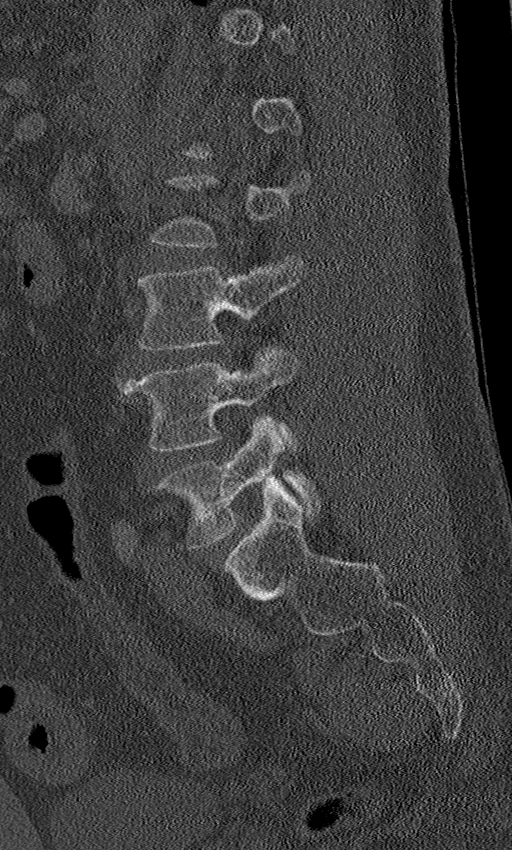
[im 63/95  bone]
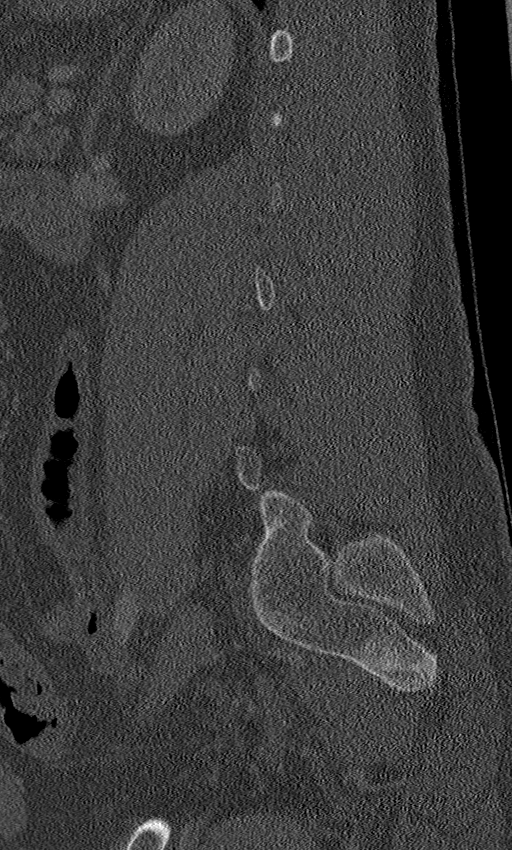

[12 of 33 positions shown; findings below may reference images not displayed]

FINDINGS: CT THORACIC SPINE FINDINGS

Alignment: S shaped curvature of the thoracolumbar spine. No
listhesis.

Vertebrae: No acute fracture or focal pathologic process.

Paraspinal and other soft tissues: Please see same-day CT chest
abdomen pelvis.

Disc levels: No high-grade spinal canal stenosis or neural foraminal
narrowing.

CT LUMBAR SPINE FINDINGS

Segmentation: 5 lumbar type vertebrae.

Alignment: S shaped curvature of the thoracolumbar spine. No
listhesis.

Vertebrae: No acute fracture or focal pathologic practice.

Paraspinal and other soft tissues: Please see same-day CT chest
abdomen pelvis.

Disc levels: No high-grade spinal canal stenosis or neural foraminal
narrowing.
IMPRESSION: No acute fracture or traumatic subluxation in the thoracic or lumbar
spine.

For visceral findings, please see same-day CT chest, abdomen pelvis.

## 2021-08-02 IMAGING — CT CT CERVICAL SPINE W/O CM
3 of 4 series · 13 of 33 positions shown, 16 images · non-contrast
Comparison: None.

CLINICAL DATA: Poly trauma

EXAM:
CT HEAD WITHOUT CONTRAST
CT CERVICAL SPINE WITHOUT CONTRAST
TECHNIQUE: Multidetector CT imaging of the head and cervical spine was
performed following the standard protocol without intravenous
contrast. Multiplanar CT image reconstructions of the cervical spine
were also generated.

[Series 6: orthogonal bone · axial · 0.23mm/px · z∈[-336,-183]mm · 5 of 113 slices shown, 7 images]
[im 17/113  soft-tissue]
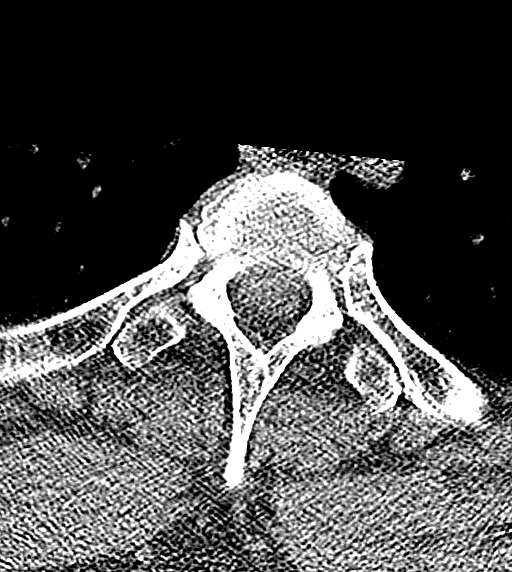
[im 17/113  bone]
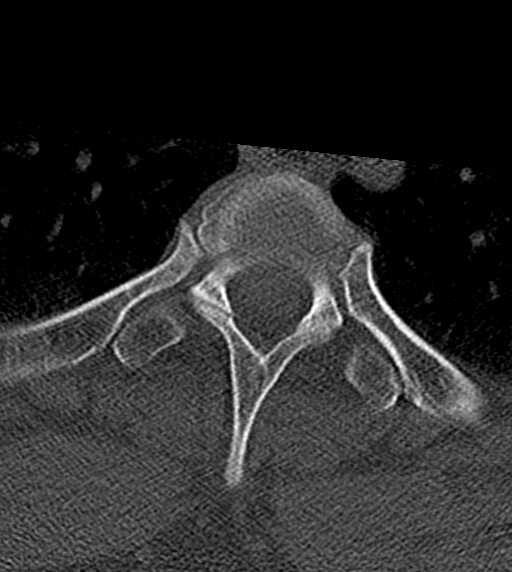
[im 33/113  bone]
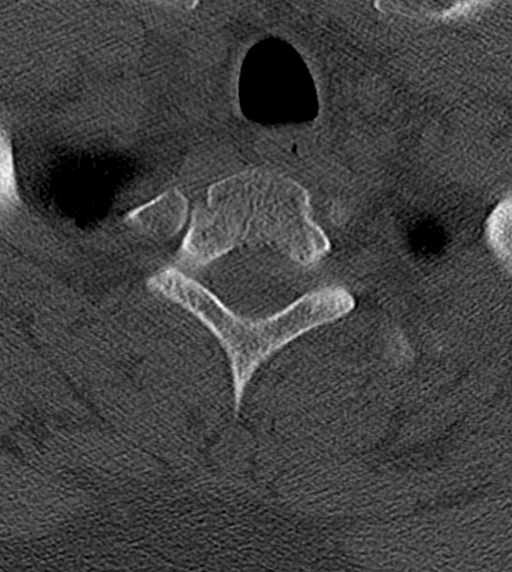
[im 65/113  bone]
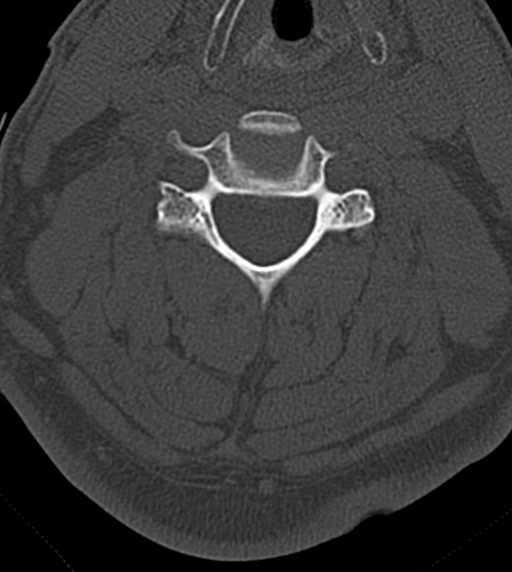
[im 81/113  bone]
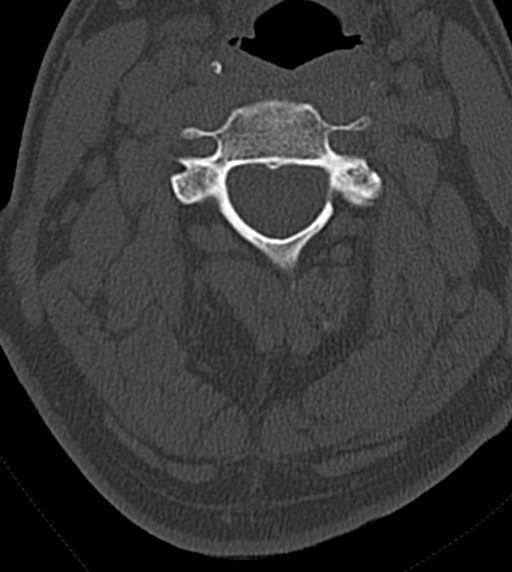
[im 97/113  soft-tissue]
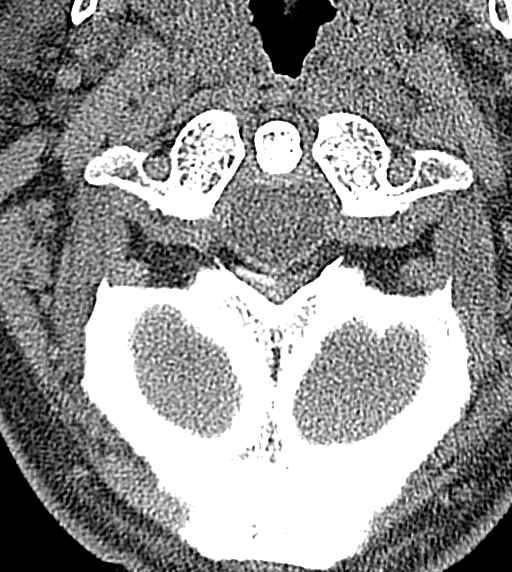
[im 97/113  bone]
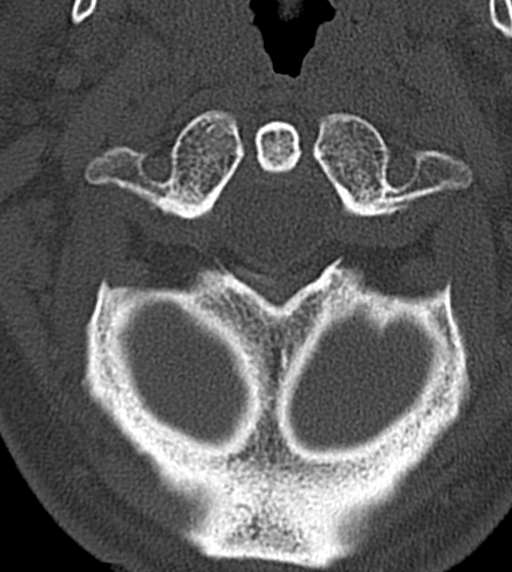

[Series 7: coronal bone · coronal · 0.27mm/px · 3 of 65 slices shown]
[im 13/65  bone]
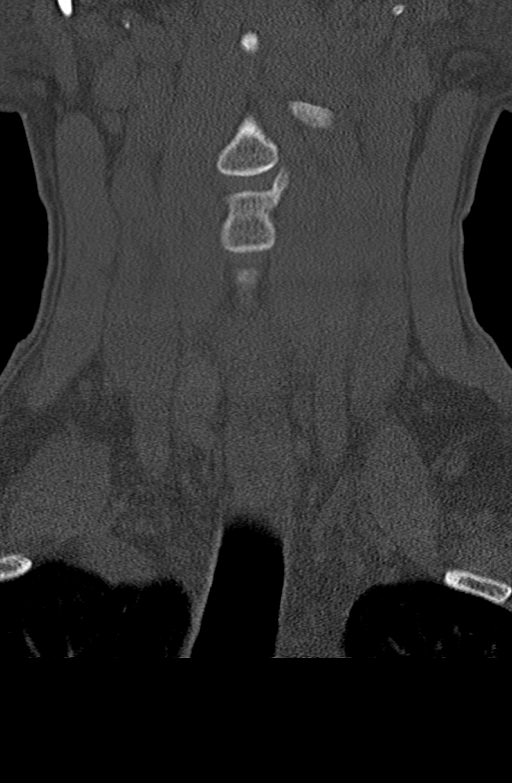
[im 26/65  bone]
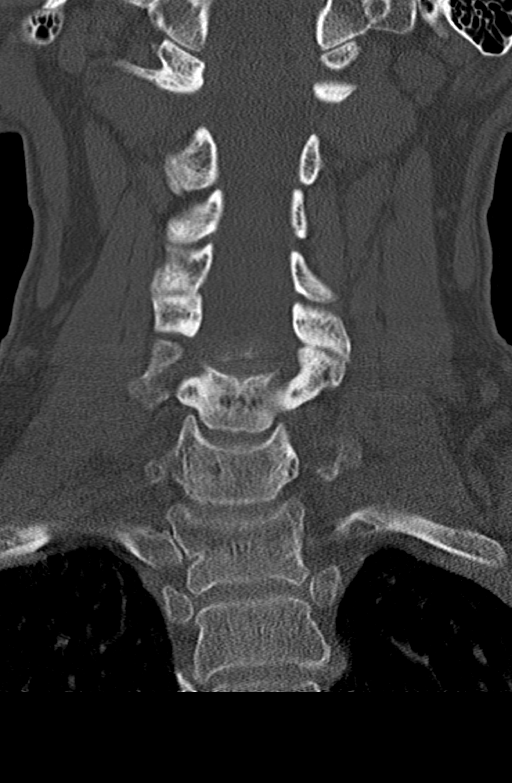
[im 39/65  bone]
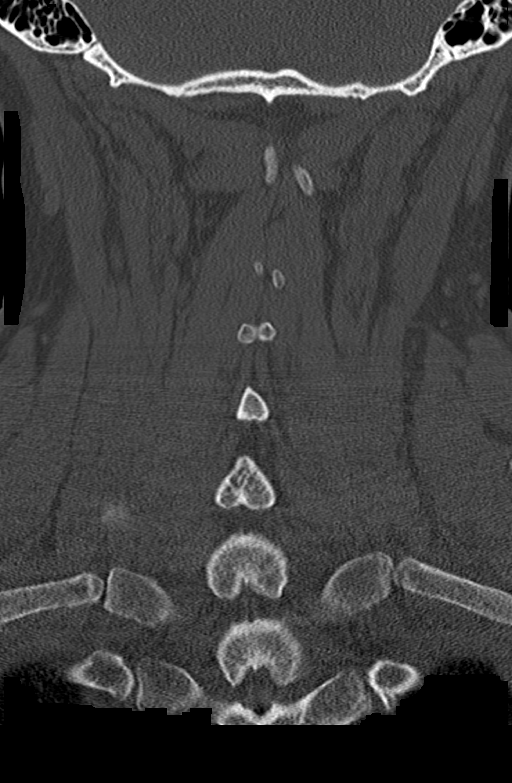

[Series 8: sagittal bone · sagittal · 0.32mm/px · 5 of 61 slices shown, 6 images]
[im 21/61  bone]
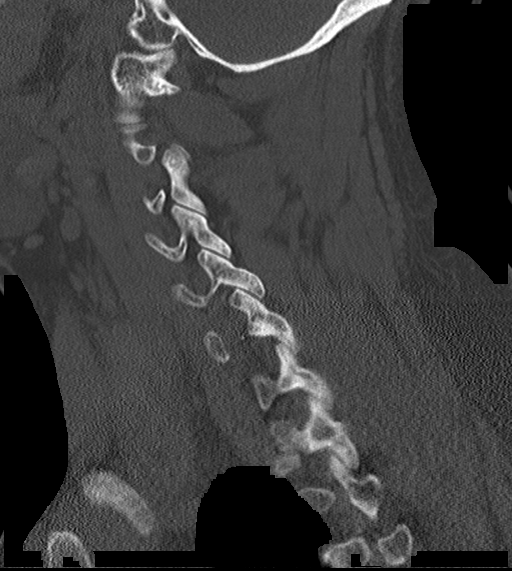
[im 26/61  bone]
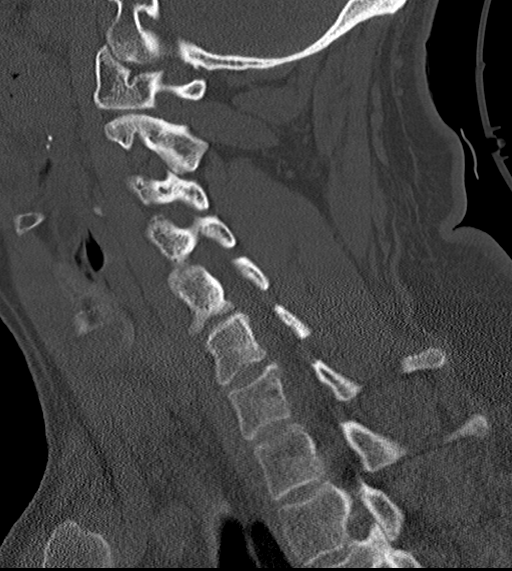
[im 31/61  soft-tissue]
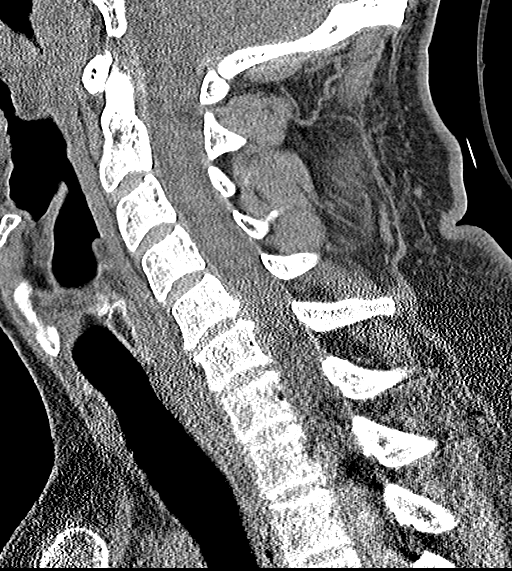
[im 31/61  bone]
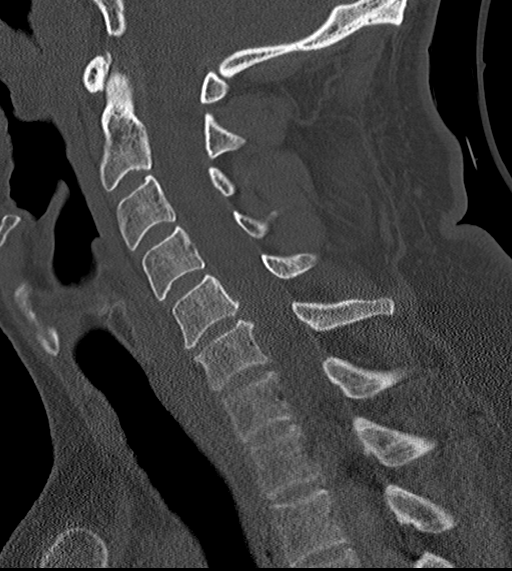
[im 36/61  bone]
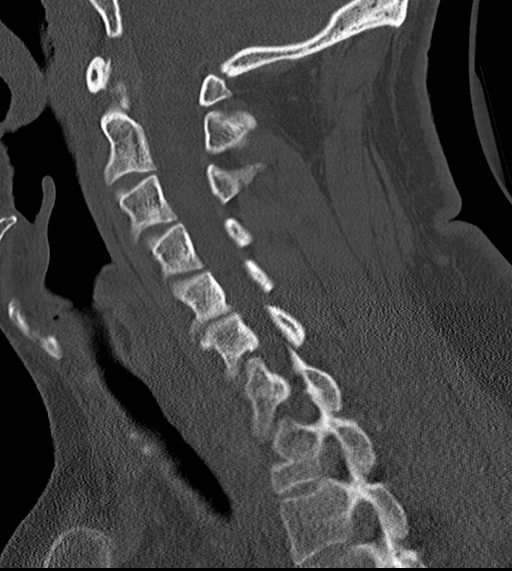
[im 41/61  bone]
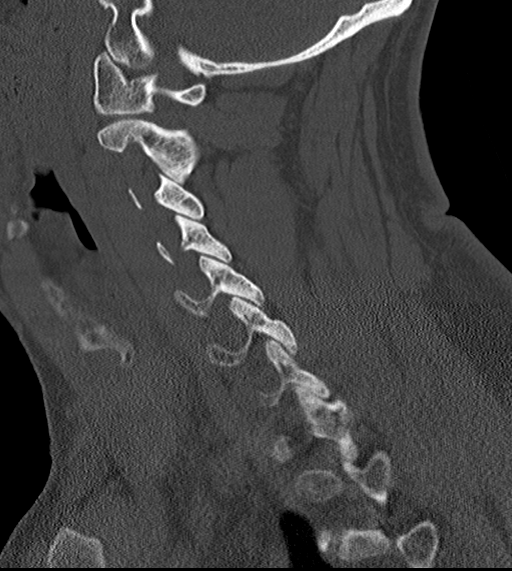

[13 of 33 positions shown; findings below may reference images not displayed]

FINDINGS: CT HEAD FINDINGS

BRAIN:
BRAIN
Patchy and confluent areas of decreased attenuation are noted
throughout the deep and periventricular white matter of the cerebral
hemispheres bilaterally, compatible with chronic microvascular
ischemic disease.

No evidence of large-territorial acute infarction. No parenchymal
hemorrhage. No mass lesion. No extra-axial collection.

No mass effect or midline shift. No hydrocephalus. Basilar cisterns
are patent.

Vascular: No hyperdense vessel.

Skull: No acute fracture or focal lesion.

Sinuses/Orbits: Mucosal thickening of the right maxillary sinus.
Otherwise paranasal sinuses and mastoid air cells are clear. The
orbits are unremarkable.

Other: None.

CT CERVICAL SPINE FINDINGS

Alignment: Normal.

Skull base and vertebrae: No acute fracture. No aggressive appearing
focal osseous lesion or focal pathologic process.

Soft tissues and spinal canal: No prevertebral fluid or swelling. No
visible canal hematoma.

Upper chest: Paraseptal emphysematous changes.

Other: None.
IMPRESSION: 1. Negative for acute traumatic injury.
2. No acute displaced fracture or traumatic listhesis of the
cervical spine.
3.  Emphysema ([26]-[26]).

## 2021-08-02 IMAGING — CT CT CHEST W/ CM
2 of 4 series · 12 of 36 positions shown, 15 images · IV contrast (OMNIPAQUE 350)
Comparison: CT abdomen pelvis [DATE], CT abdomen pelvis
[DATE]

CLINICAL DATA: Chest and abdominal trauma.  Status post fall

EXAM:
CT CHEST, ABDOMEN, AND PELVIS WITH CONTRAST
TECHNIQUE: Multidetector CT imaging of the chest, abdomen and pelvis was
performed following the standard protocol during bolus
administration of intravenous contrast.
CONTRAST:  80mL OMNIPAQUE IOHEXOL 350 MG/ML SOLN

[Series 3: cap with · axial · 0.84mm/px · z∈[-903,-328]mm · 9 of 139 slices shown, 12 images]
[im 12/139  mediastinal]
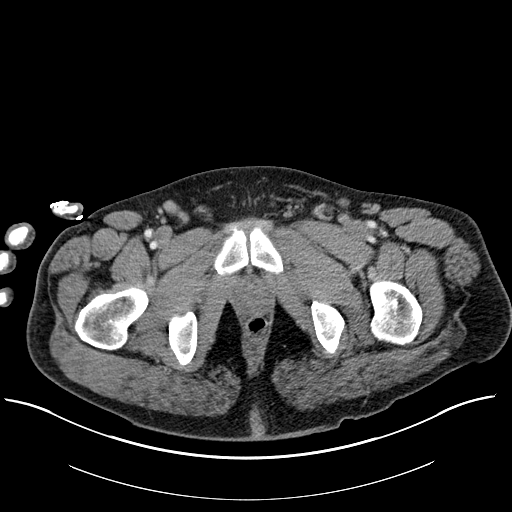
[im 12/139  lung]
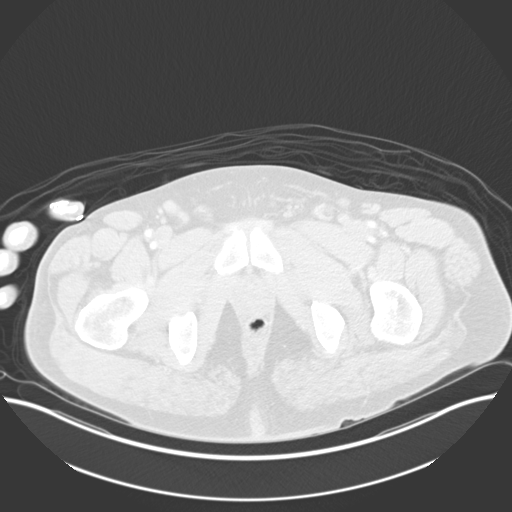
[im 24/139  lung]
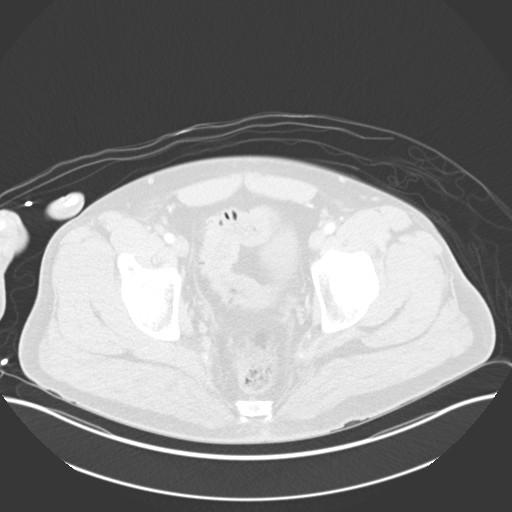
[im 47/139  lung]
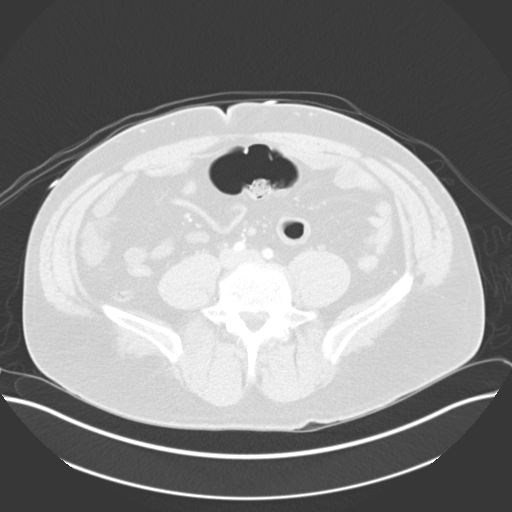
[im 58/139  lung]
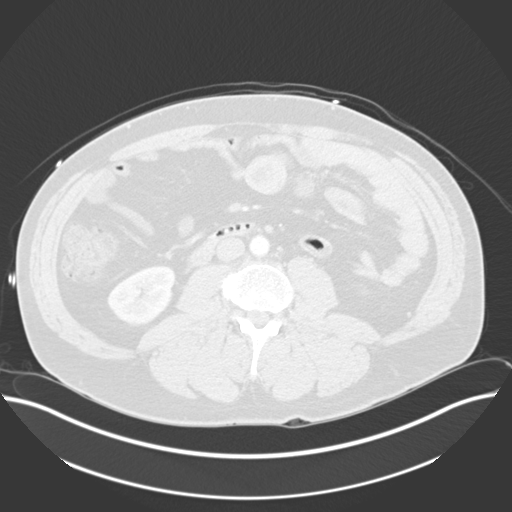
[im 70/139  mediastinal]
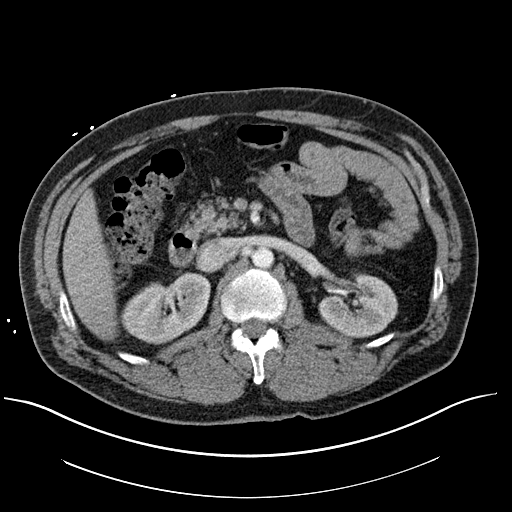
[im 70/139  lung]
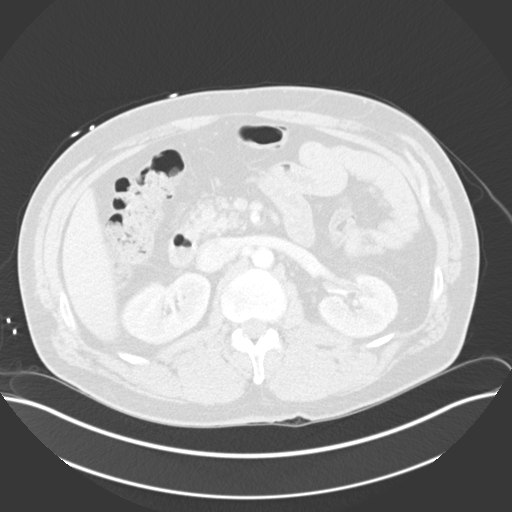
[im 81/139  lung]
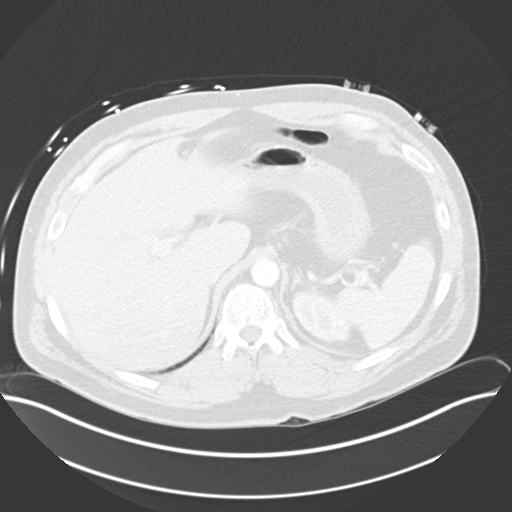
[im 93/139  lung]
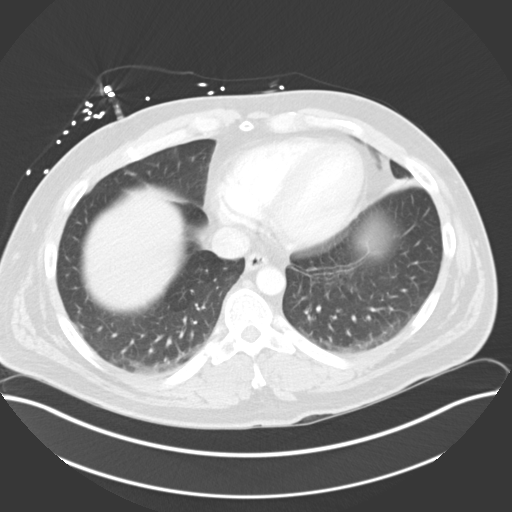
[im 116/139  lung]
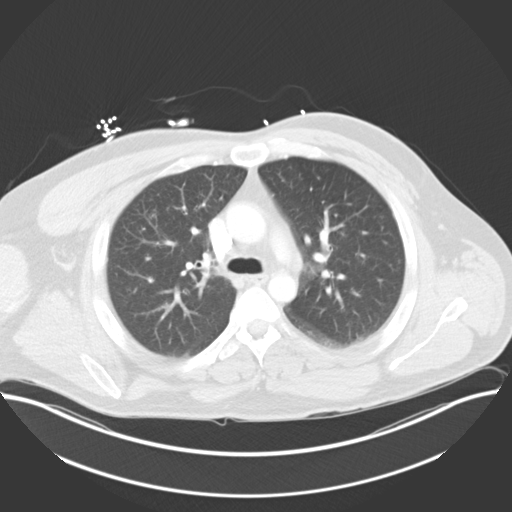
[im 127/139  mediastinal]
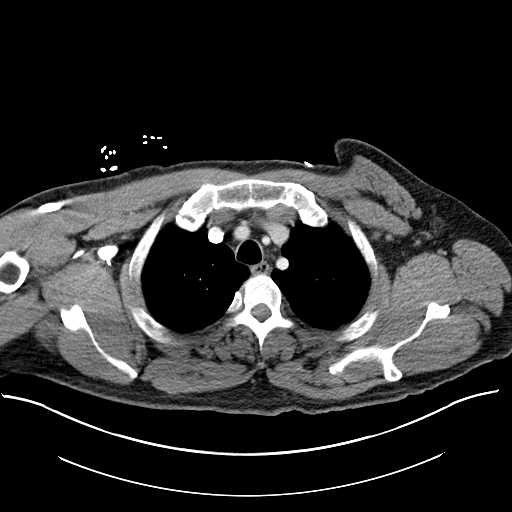
[im 127/139  lung]
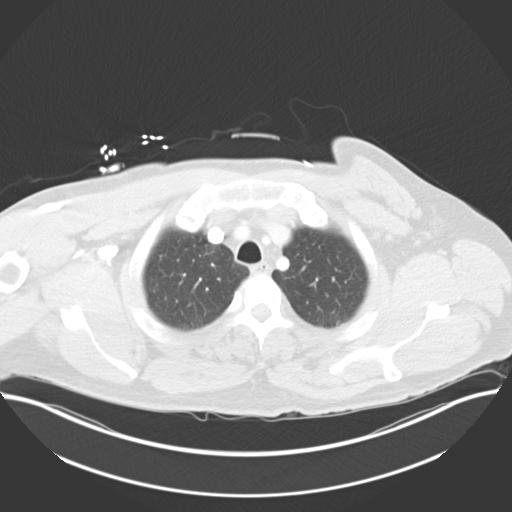

[Series 4: coronals · coronal · 0.95mm/px · 3 of 158 slices shown]
[im 32/158  lung]
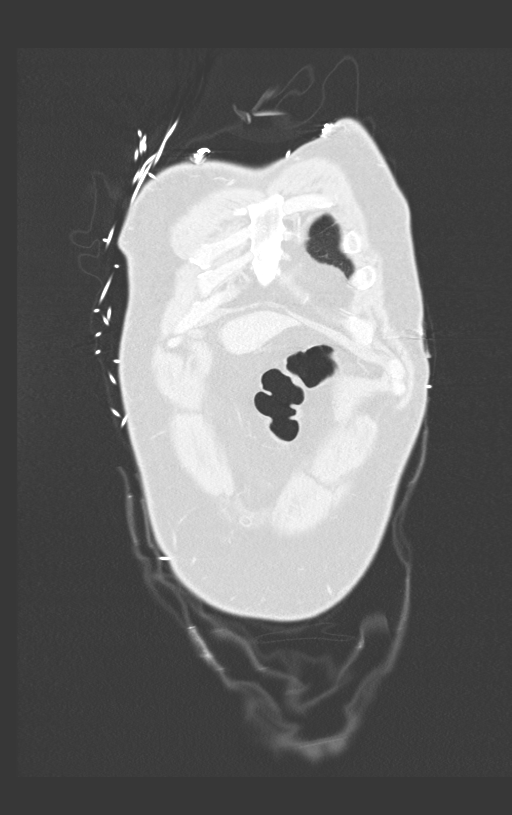
[im 63/158  lung]
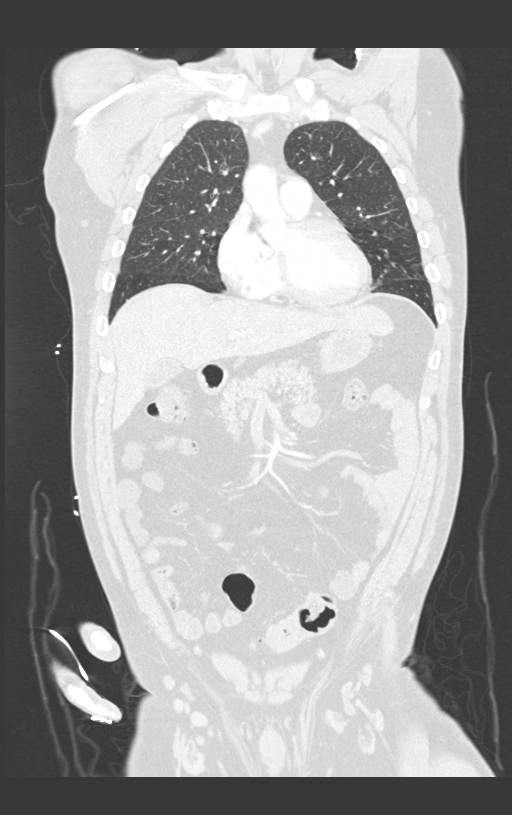
[im 95/158  lung]
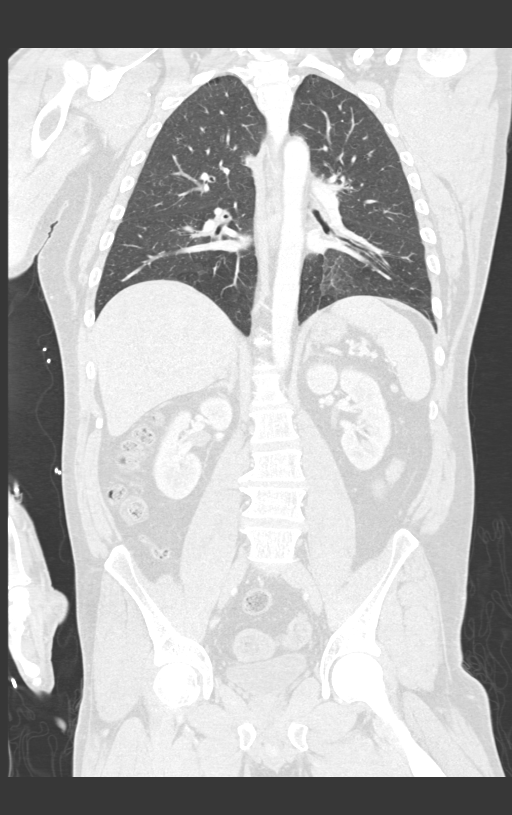

[12 of 36 positions shown; findings below may reference images not displayed]

FINDINGS: CT HEAD FINDINGS

Brain:

No evidence of large-territorial acute infarction. No parenchymal
hemorrhage. No mass lesion. No extra-axial collection.

No mass effect or midline shift. No hydrocephalus. Basilar cisterns
are patent.

Vascular: No hyperdense vessel.

Skull: No acute fracture or focal lesion.

Sinuses/Orbits: Paranasal sinuses and mastoid air cells are clear.
The orbits are unremarkable.

Other: None.

CT CERVICAL FINDINGS

Alignment: Normal.

Skull base and vertebrae: No acute fracture. No aggressive appearing
focal osseous lesion or focal pathologic process.

Soft tissues and spinal canal: No prevertebral fluid or swelling. No
visible canal hematoma.

Upper chest: Unremarkable.

Other: None.

CT CHEST FINDINGS

Ports and Devices: None.

Lungs/airways:

No focal consolidation. Scattered peribronchovascular over nodules.
There is a 5 mm right upper lobe pulmonary nodule ([DATE]). No
pulmonary mass. No pulmonary contusion or laceration. No
pneumatocele formation.

The central airways are patent.

Pleura: No pleural effusion. No pneumothorax. No hemothorax.

Lymph Nodes: No mediastinal, hilar, or axillary lymphadenopathy.

Mediastinum:

No pneumomediastinum. No aortic injury or mediastinal hematoma.

The thoracic aorta is normal in caliber. The heart is normal in
size. No significant pericardial effusion.

The esophagus is unremarkable.

The thyroid is unremarkable.

Chest Wall / Breasts: No chest wall mass.

Musculoskeletal: No acute rib or sternal fracture. Please see
separately dictated CT thoracic spine [DATE].

CT ABDOMEN AND PELVIS FINDINGS

Liver: Not enlarged. No focal lesion. No laceration or subcapsular
hematoma.

Biliary System: The gallbladder is otherwise unremarkable with no
radio-opaque gallstones. No biliary ductal dilatation.

Pancreas: Normal pancreatic contour. No main pancreatic duct
dilatation.

Spleen: Not enlarged. No focal lesion. No laceration, subcapsular
hematoma, or vascular injury. A splenule is noted.

Adrenal Glands: No nodularity bilaterally.

Kidneys:

Bilateral kidneys enhance symmetrically. No hydronephrosis. No
contusion, laceration, or subcapsular hematoma.

No injury to the vascular structures or collecting systems. No
hydroureter.

The urinary bladder is unremarkable.

Bowel: No small bowel wall thickening or dilatation. Bowel wall
thickening of the descending colon likely due to under distension.
There is however bowel wall thickening of the in the setting of
diverticulosis. Trace pericolonic fat stranding. Proximal sigmoid
colon. The appendix is unremarkable.

Mesentery, Omentum, and Peritoneum: No simple free fluid ascites. No
pneumoperitoneum. No hemoperitoneum. No mesenteric hematoma
identified. No organized fluid collection.

Pelvic Organs: Normal.

Lymph Nodes: No abdominal, pelvic, inguinal lymphadenopathy.

Vasculature: No abdominal aorta or iliac aneurysm. No active
contrast extravasation or pseudoaneurysm.

Musculoskeletal:

No significant soft tissue hematoma.

No acute pelvic fracture. Please see separately dictated CT lumbar
spine [DATE].
IMPRESSION: 1.  No acute traumatic injury to the chest, abdomen, or pelvis.

2. No acute fracture or traumatic malalignment of the thoracic or
lumbar spine.

Other imaging findings of potential clinical significance:

1. Pulmonary findings suggestive of atypical pneumonia.
2. Scattered pulmonary micronodules as well as a 5 mm right upper
lobe pulmonary nodule. No follow-up needed if patient is low-risk
(and has no known or suspected primary neoplasm). Non-contrast chest
CT can be considered in 12 months if patient is high-risk. This
recommendation follows the consensus statement: Guidelines for
Management of Incidental Pulmonary Nodules Detected on CT Images:
3. Colonic diverticulosis with possible early/mild uncomplicated
acute sigmoid diverticulitis. Consider colonoscopy status post
treatment and status post complete resolution of inflammatory
changes to exclude an underlying lesion.

## 2021-08-02 IMAGING — DX DG PORTABLE PELVIS
1 series · 1 of 1 positions shown · non-contrast
Comparison: None.

CLINICAL DATA: Fall left roof/ladder and roofing fell on top of
him.

EXAM:
PORTABLE PELVIS 1-2 VIEWS

[pelvis ap]
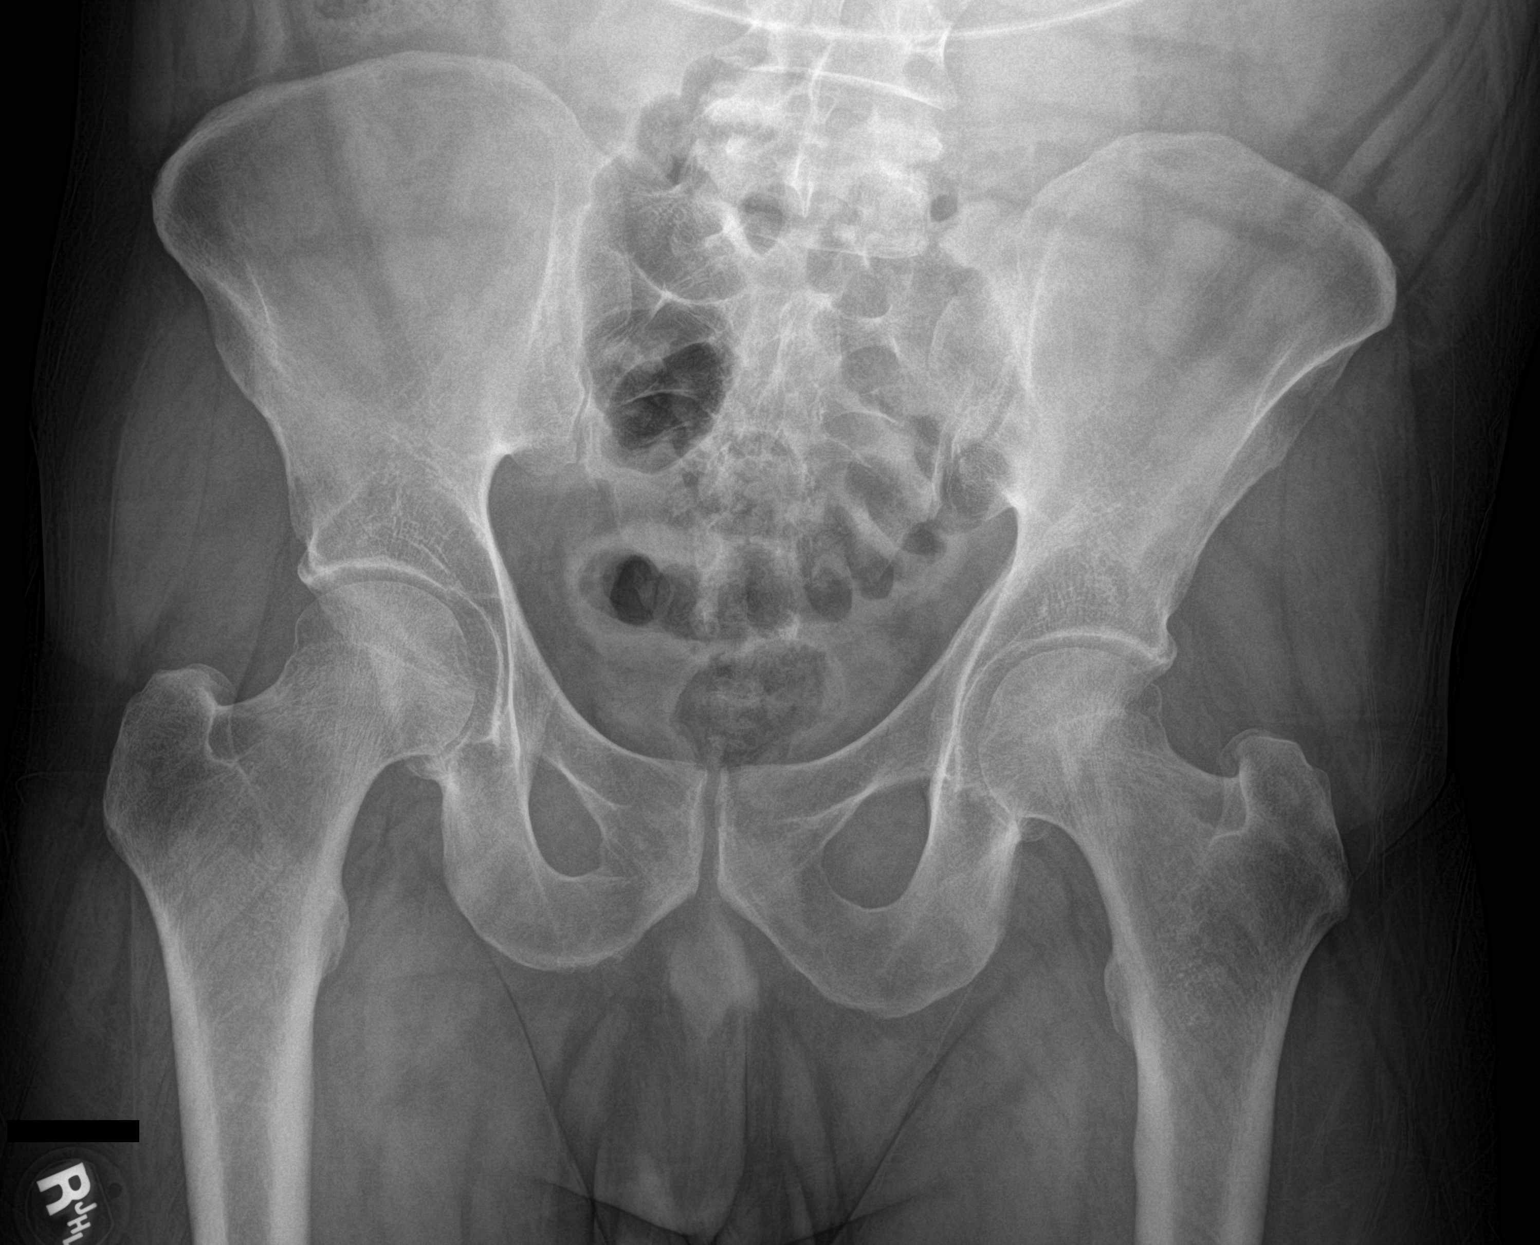

[1 of 1 positions shown; findings below may reference images not displayed]

FINDINGS: The cortical margins of the bony pelvis are intact. No fracture.
Pubic symphysis and sacroiliac joints are congruent. Both femoral
heads are well-seated in the respective acetabula.
IMPRESSION: No pelvic fracture.

## 2021-08-02 IMAGING — CT CT HEAD W/O CM
3 series · 14 of 47 positions shown, 16 images · non-contrast
Comparison: None.

CLINICAL DATA: Poly trauma

EXAM:
CT HEAD WITHOUT CONTRAST
CT CERVICAL SPINE WITHOUT CONTRAST
TECHNIQUE: Multidetector CT imaging of the head and cervical spine was
performed following the standard protocol without intravenous
contrast. Multiplanar CT image reconstructions of the cervical spine
were also generated.

[Series 3: head wo · axial · 0.47mm/px · z∈[-167,-12]mm · 8 of 37 slices shown, 10 images]
[im 3/37  brain]
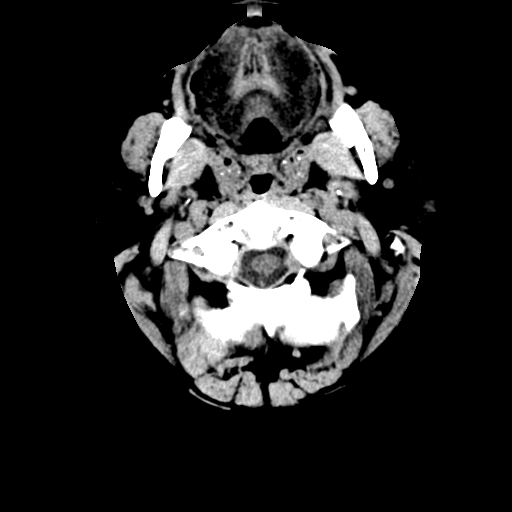
[im 3/37  bone]
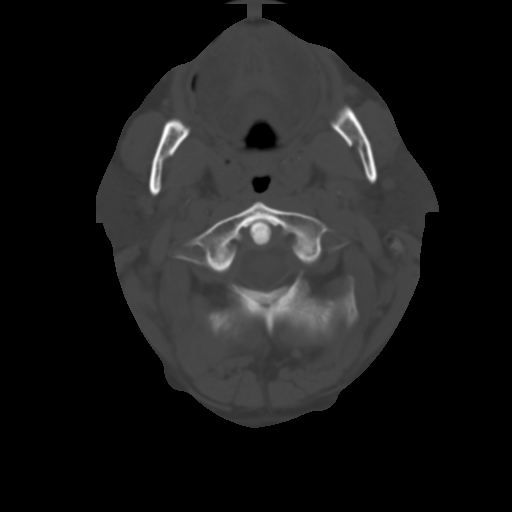
[im 8/37  brain]
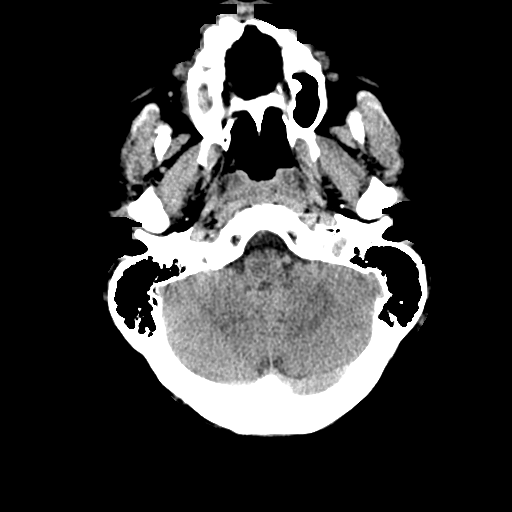
[im 12/37  brain]
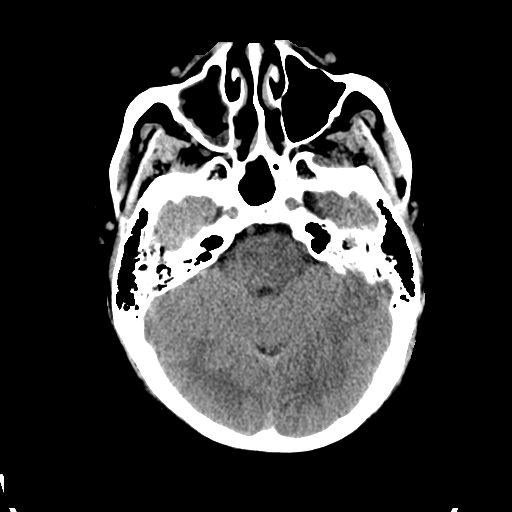
[im 17/37  brain]
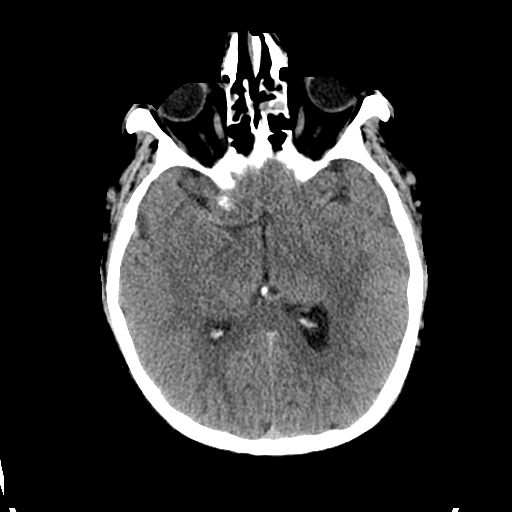
[im 20/37  brain]
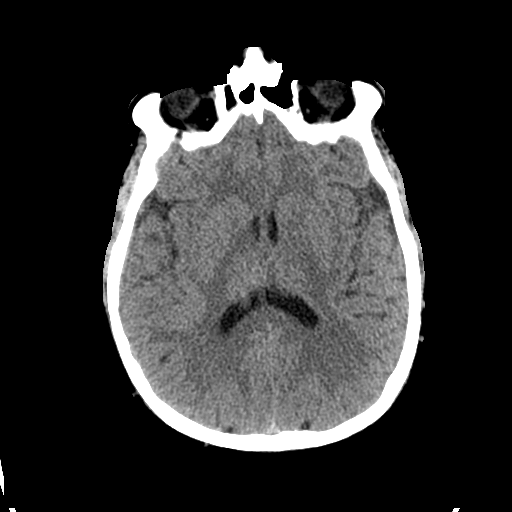
[im 20/37  bone]
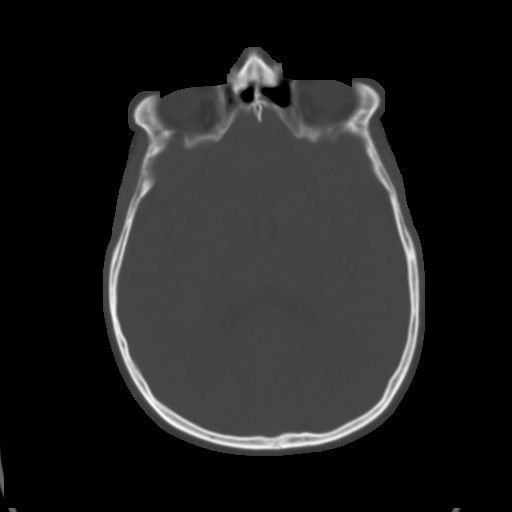
[im 25/37  brain]
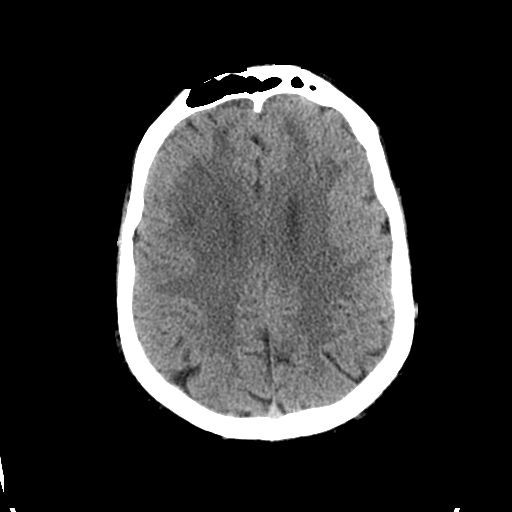
[im 29/37  brain]
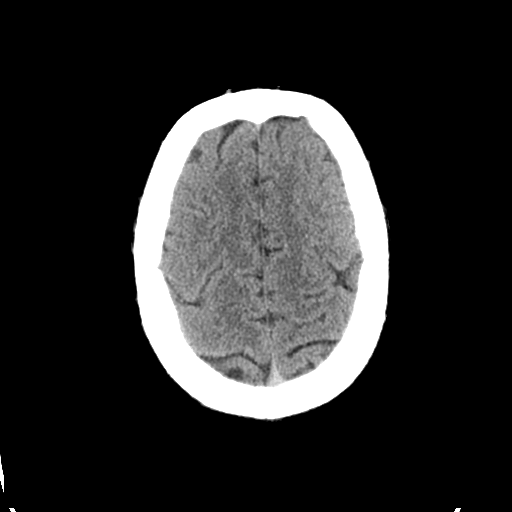
[im 34/37  brain]
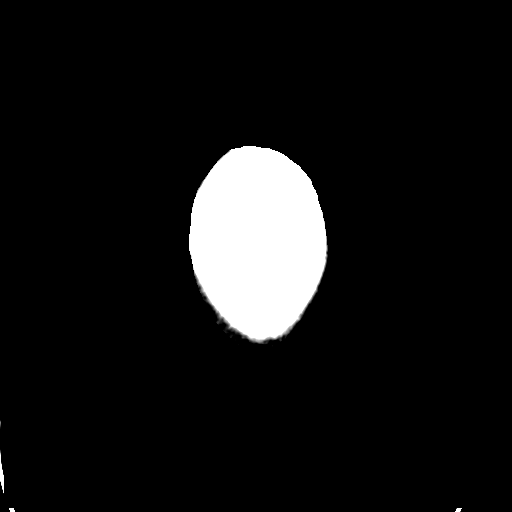

[Series 6: coronal soft tissue · coronal · 0.35mm/px · 3 of 81 slices shown]
[im 27/81  brain]
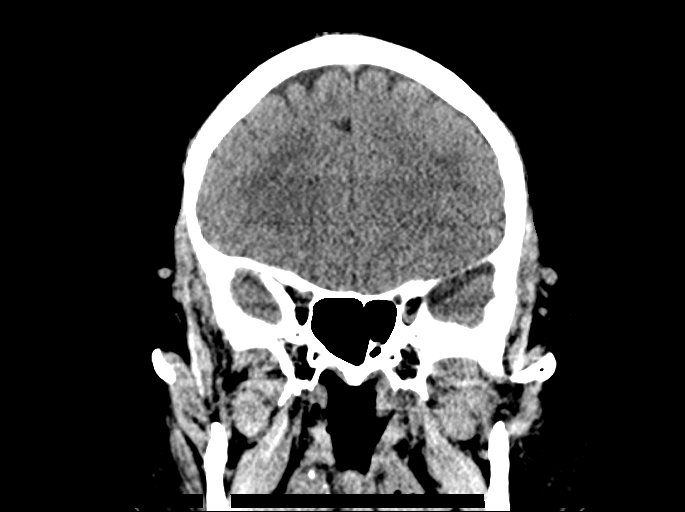
[im 36/81  brain]
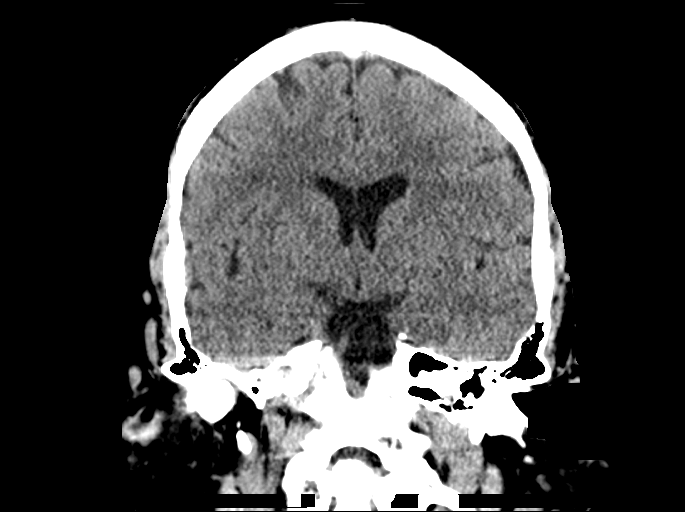
[im 45/81  brain]
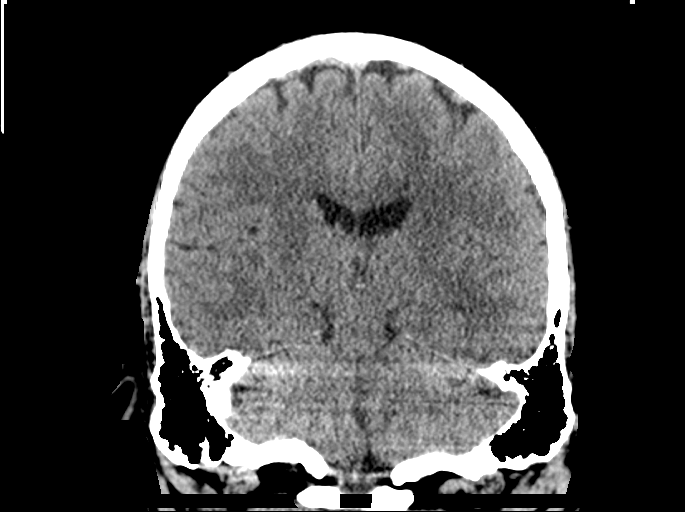

[Series 7: sagittal soft tissue · sagittal · 0.35mm/px · 3 of 60 slices shown]
[im 20/60  brain]
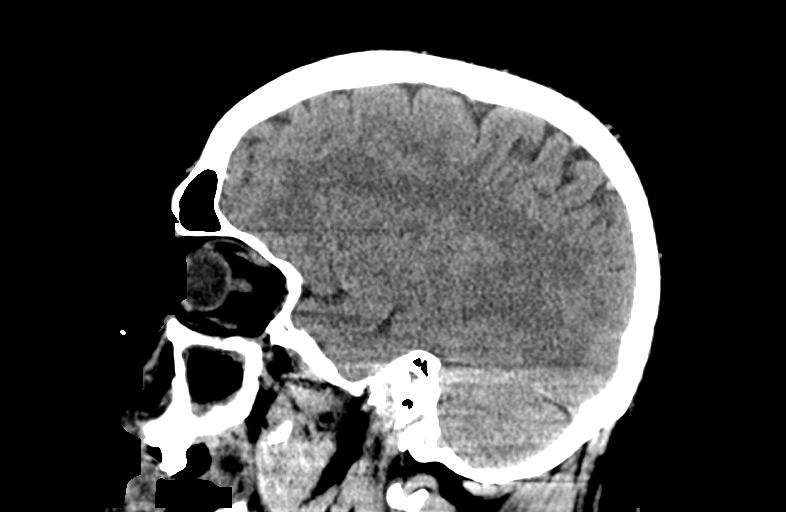
[im 30/60  brain]
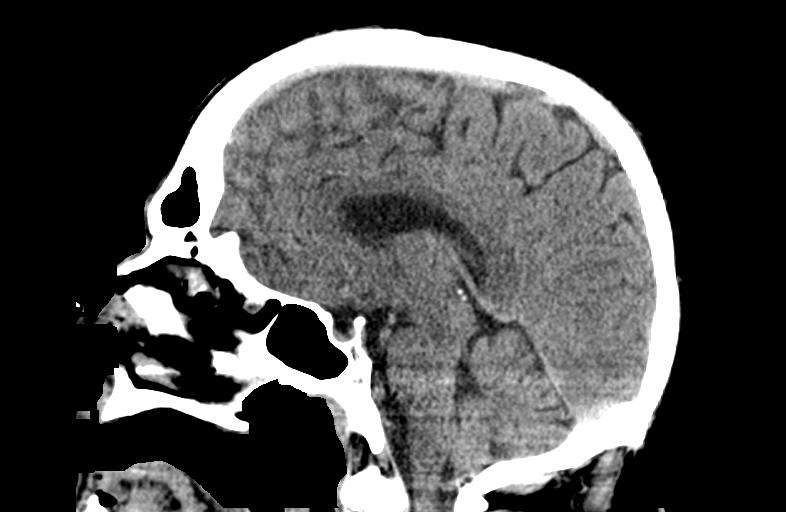
[im 40/60  brain]
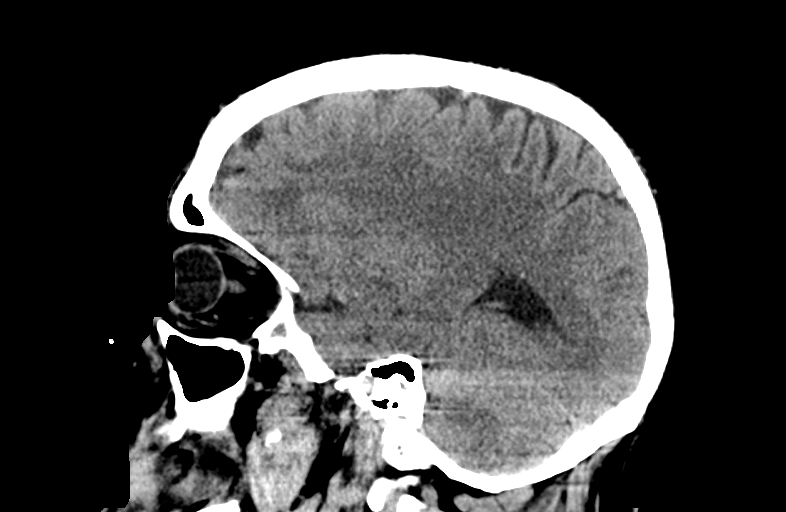

[14 of 47 positions shown; findings below may reference images not displayed]

FINDINGS: CT HEAD FINDINGS

BRAIN:
BRAIN
Patchy and confluent areas of decreased attenuation are noted
throughout the deep and periventricular white matter of the cerebral
hemispheres bilaterally, compatible with chronic microvascular
ischemic disease.

No evidence of large-territorial acute infarction. No parenchymal
hemorrhage. No mass lesion. No extra-axial collection.

No mass effect or midline shift. No hydrocephalus. Basilar cisterns
are patent.

Vascular: No hyperdense vessel.

Skull: No acute fracture or focal lesion.

Sinuses/Orbits: Mucosal thickening of the right maxillary sinus.
Otherwise paranasal sinuses and mastoid air cells are clear. The
orbits are unremarkable.

Other: None.

CT CERVICAL SPINE FINDINGS

Alignment: Normal.

Skull base and vertebrae: No acute fracture. No aggressive appearing
focal osseous lesion or focal pathologic process.

Soft tissues and spinal canal: No prevertebral fluid or swelling. No
visible canal hematoma.

Upper chest: Paraseptal emphysematous changes.

Other: None.
IMPRESSION: 1. Negative for acute traumatic injury.
2. No acute displaced fracture or traumatic listhesis of the
cervical spine.
3.  Emphysema ([26]-[26]).

## 2021-08-02 IMAGING — DX DG CHEST 1V PORT
1 series · 1 of 1 positions shown · non-contrast
Comparison: [DATE]

CLINICAL DATA: Fall off ladder/roof with roofing falling on top of
him.

EXAM:
PORTABLE CHEST 1 VIEW

[chest ap]
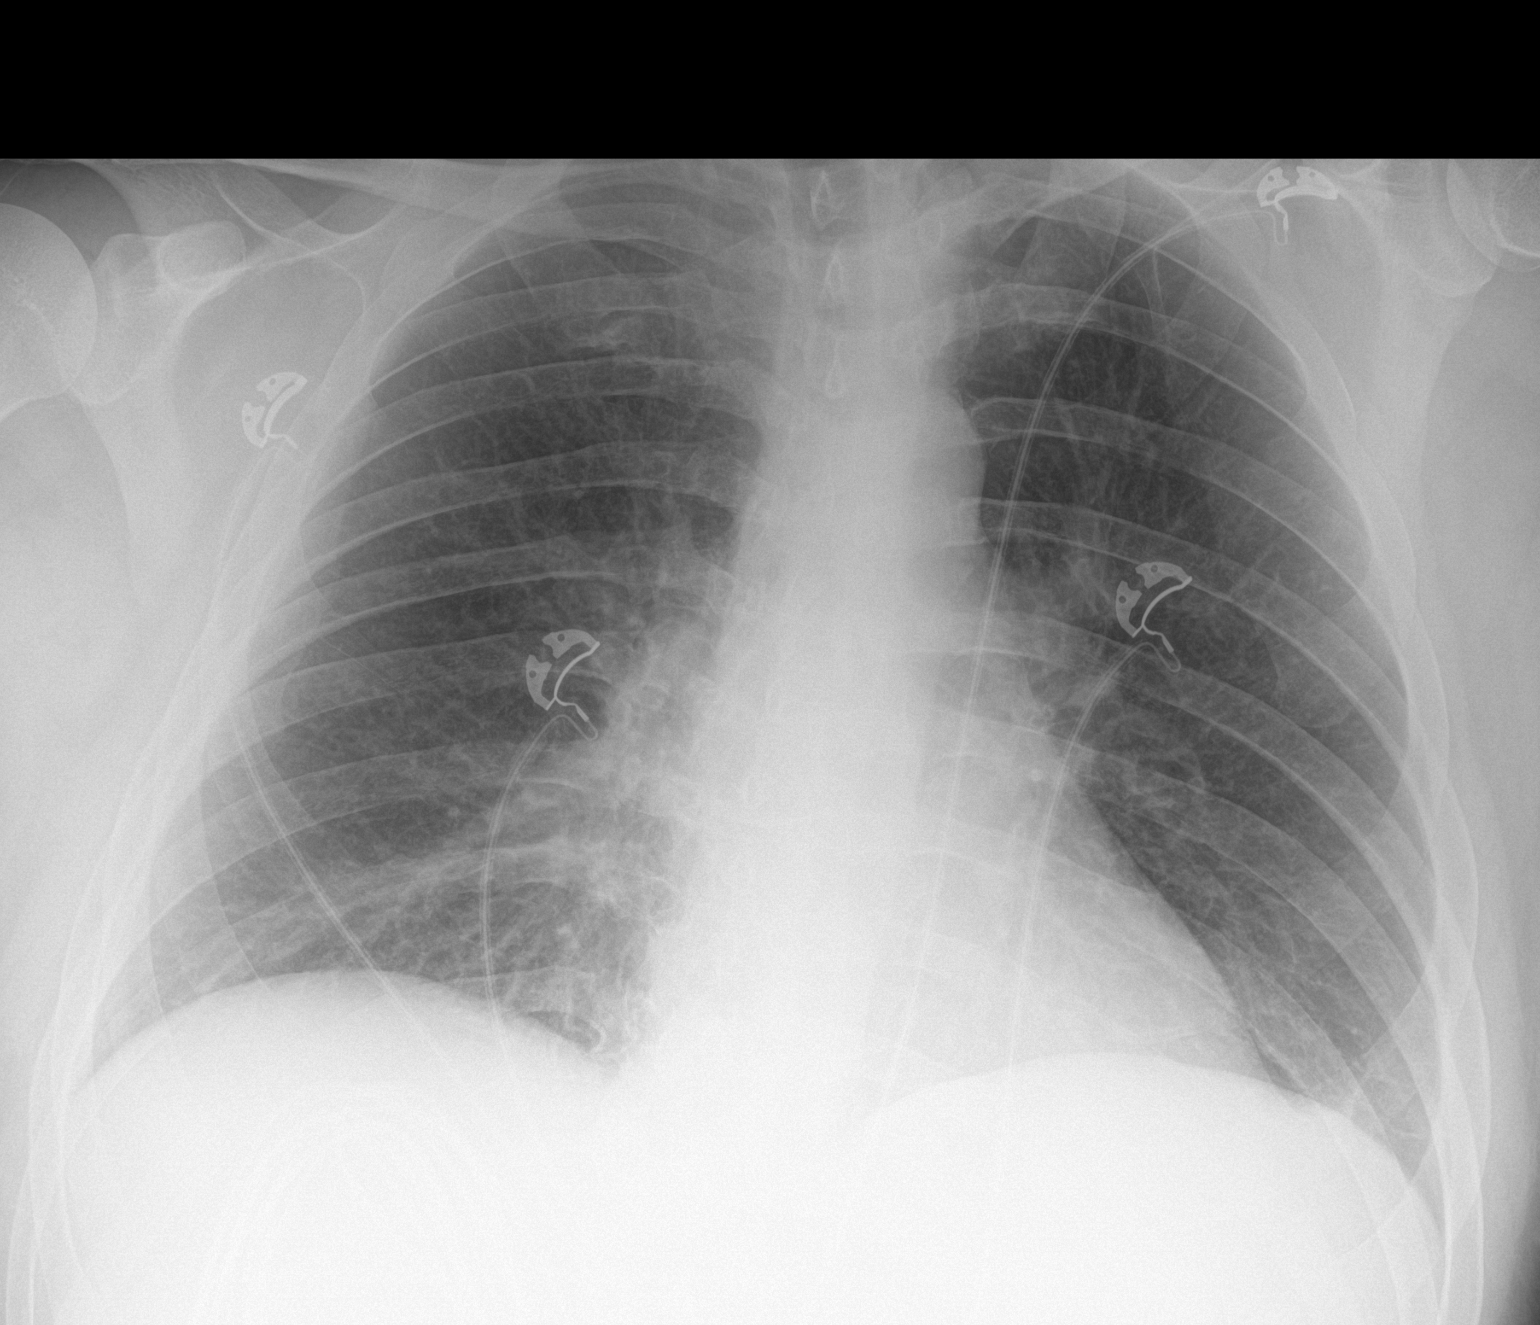

[1 of 1 positions shown; findings below may reference images not displayed]

FINDINGS: The cardiomediastinal contours are normal. The lungs are clear.
Pulmonary vasculature is normal. No consolidation, pleural effusion,
or pneumothorax. No acute osseous abnormalities are seen.
IMPRESSION: No evidence of traumatic injury.

## 2021-08-02 IMAGING — CT CT T SPINE W/O CM
3 of 6 series · 9 of 33 positions shown, 11 images · IV contrast (OMNIPAQUE 350)
Comparison: None.

CLINICAL DATA: Polytrauma

EXAM:
CT THORACIC AND LUMBAR SPINE WITHOUT CONTRAST
TECHNIQUE: Multidetector CT imaging of the thoracic and lumbar spine was
performed without contrast. Multiplanar CT image reconstructions
were also generated.

[Series 6: ax tspine cor · axial · 0.37mm/px · z∈[-421,-421]mm · 1 of 194 slices shown, 2 images]
[im 116/194  soft-tissue]
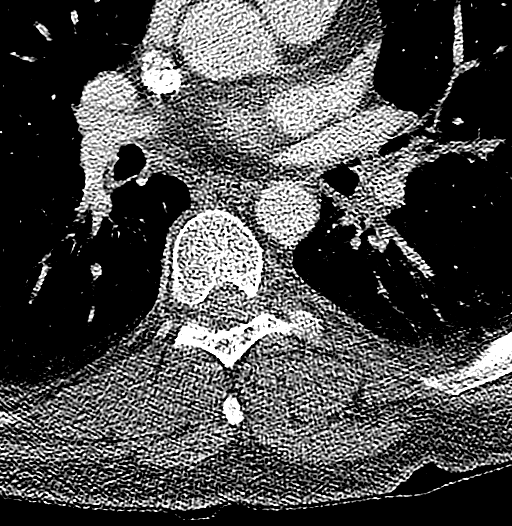
[im 116/194  bone]
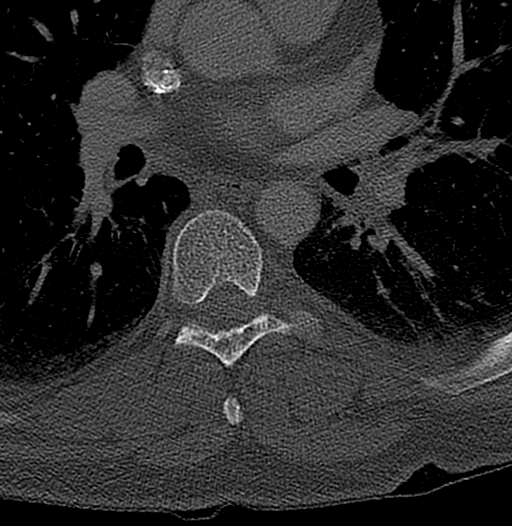

[Series 7: cor tspine cor · coronal · 0.37mm/px · 3 of 98 slices shown]
[im 20/98  bone]
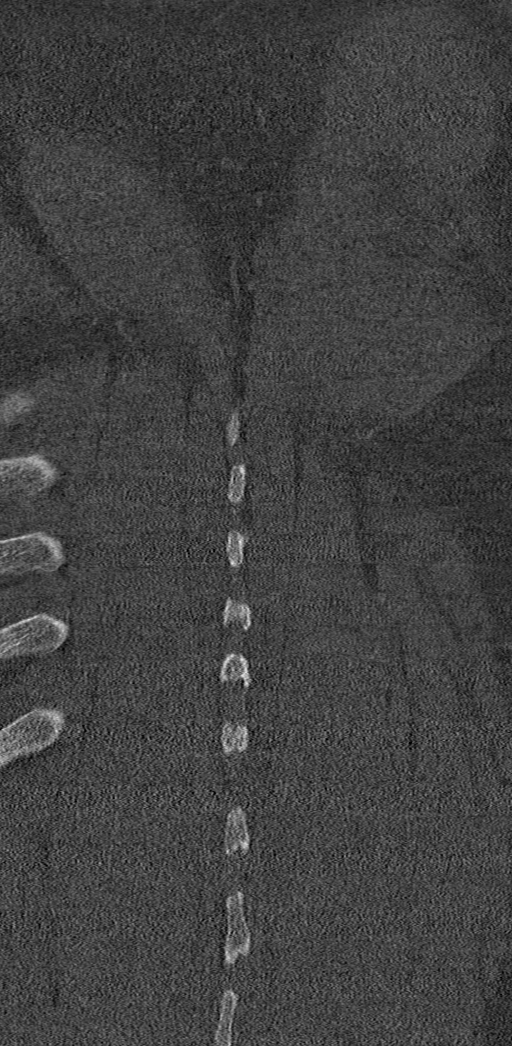
[im 39/98  bone]
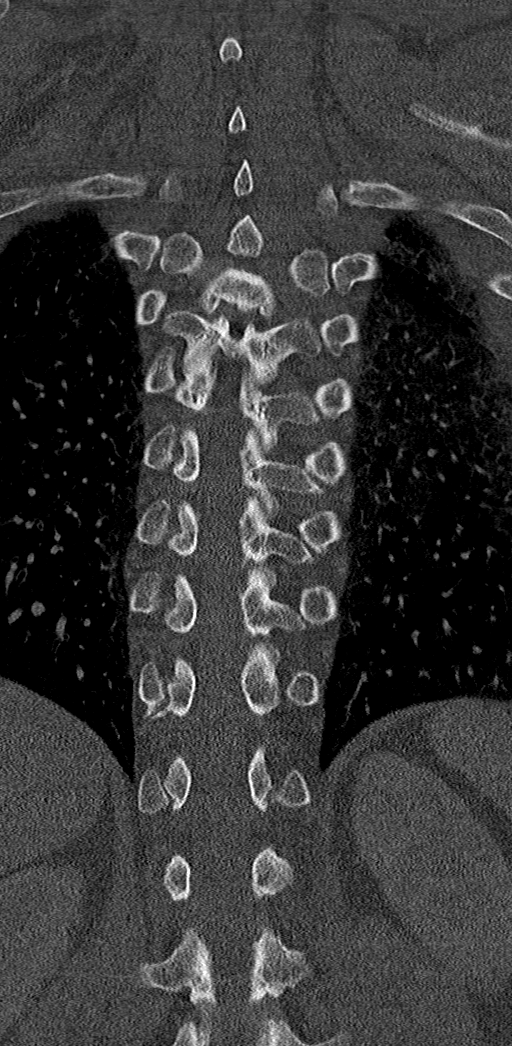
[im 59/98  bone]
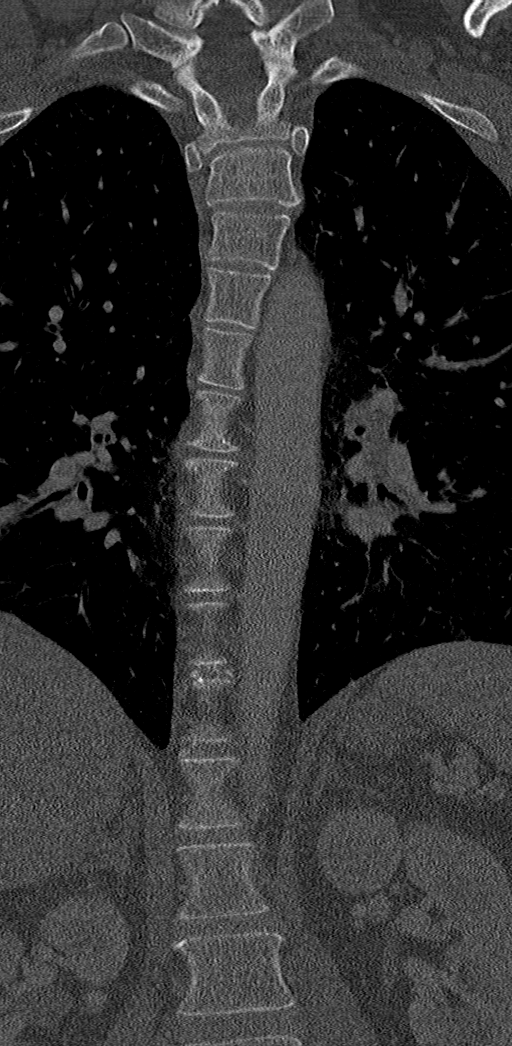

[Series 8: sag tspine cor · sagittal · 0.38mm/px · 5 of 95 slices shown, 6 images]
[im 32/95  bone]
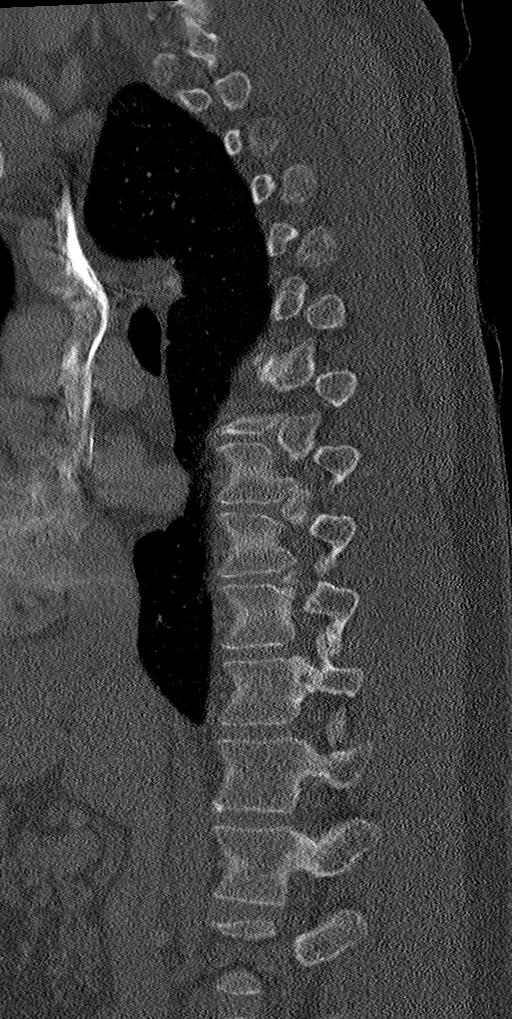
[im 40/95  bone]
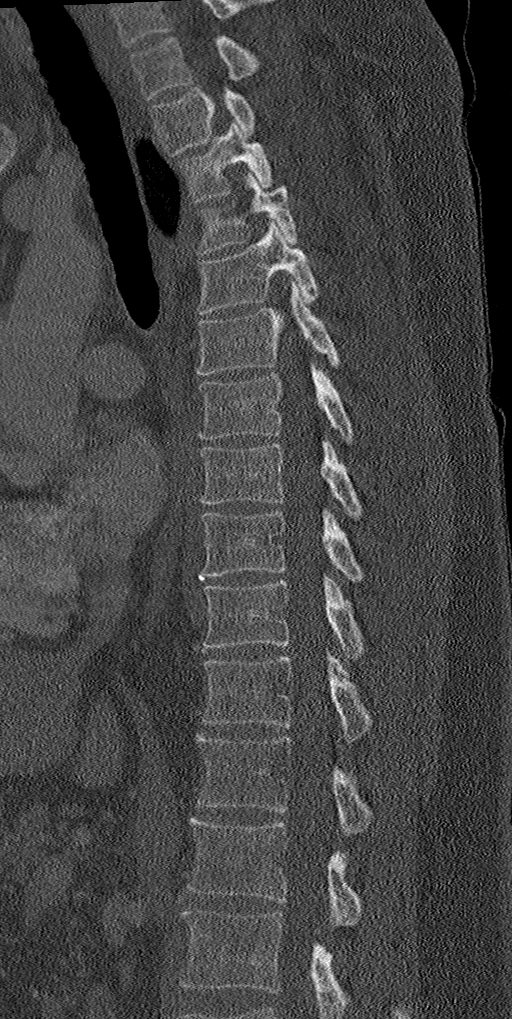
[im 48/95  soft-tissue]
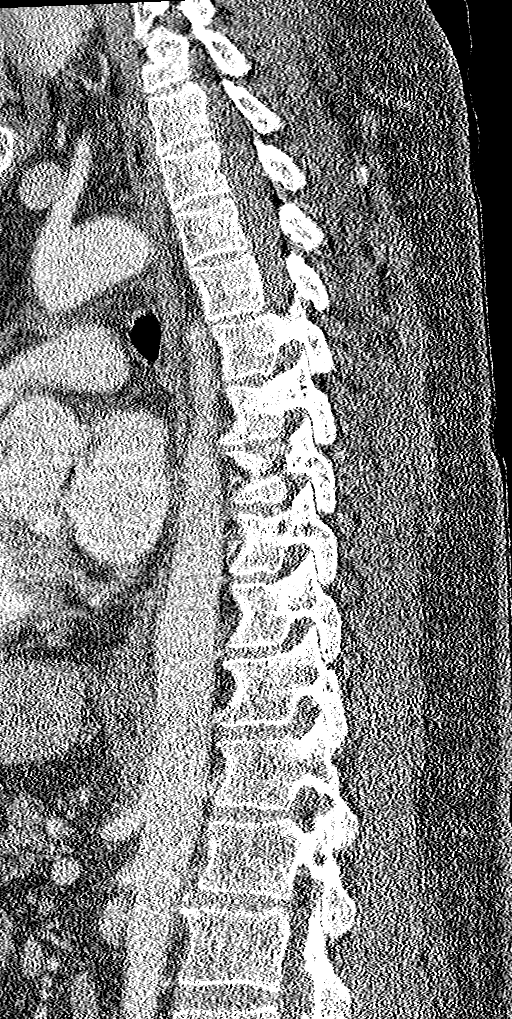
[im 48/95  bone]
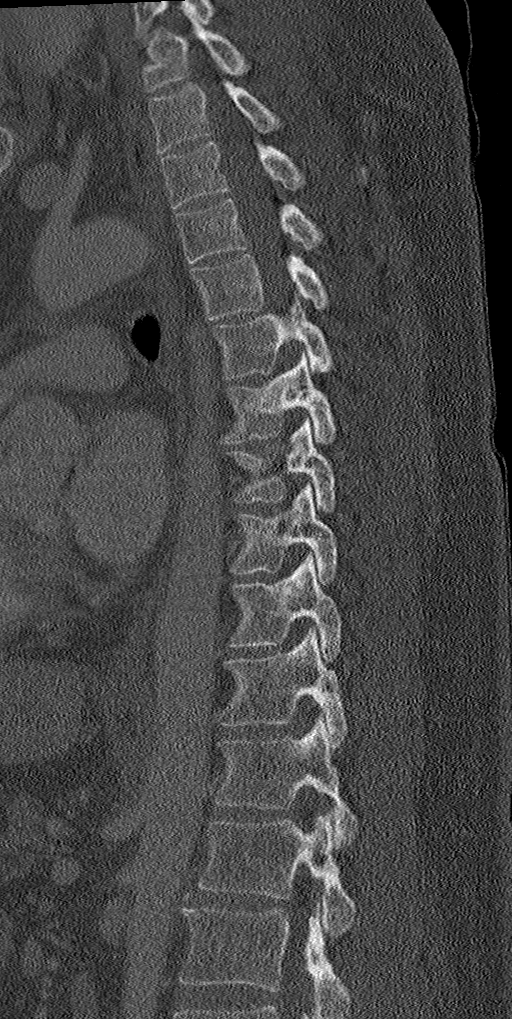
[im 55/95  bone]
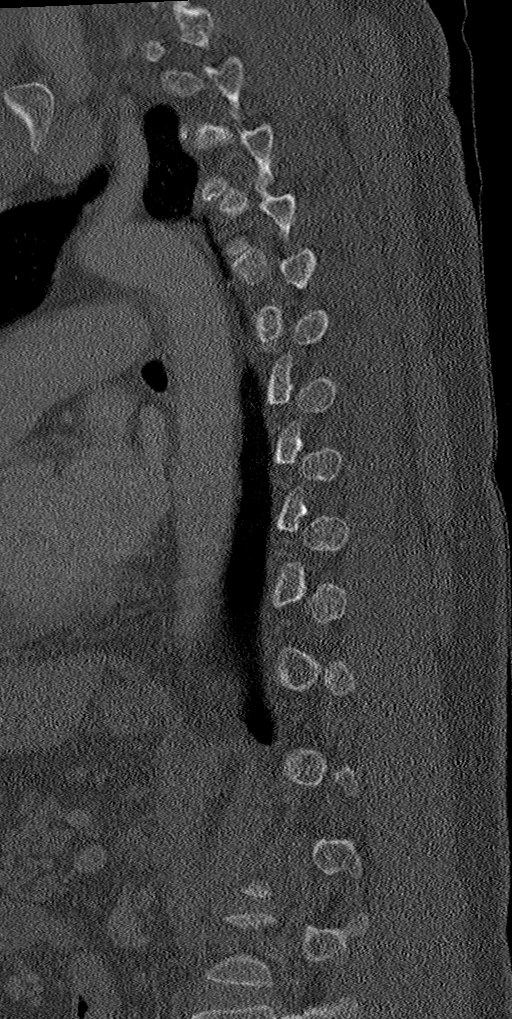
[im 63/95  bone]
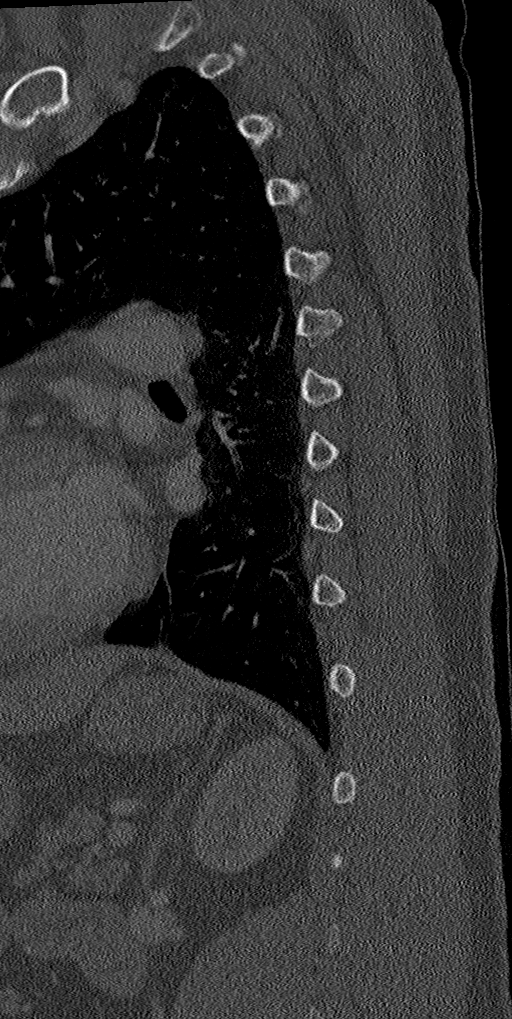

[9 of 33 positions shown; findings below may reference images not displayed]

FINDINGS: CT THORACIC SPINE FINDINGS

Alignment: S shaped curvature of the thoracolumbar spine. No
listhesis.

Vertebrae: No acute fracture or focal pathologic process.

Paraspinal and other soft tissues: Please see same-day CT chest
abdomen pelvis.

Disc levels: No high-grade spinal canal stenosis or neural foraminal
narrowing.

CT LUMBAR SPINE FINDINGS

Segmentation: 5 lumbar type vertebrae.

Alignment: S shaped curvature of the thoracolumbar spine. No
listhesis.

Vertebrae: No acute fracture or focal pathologic practice.

Paraspinal and other soft tissues: Please see same-day CT chest
abdomen pelvis.

Disc levels: No high-grade spinal canal stenosis or neural foraminal
narrowing.
IMPRESSION: No acute fracture or traumatic subluxation in the thoracic or lumbar
spine.

For visceral findings, please see same-day CT chest, abdomen pelvis.

## 2021-08-02 IMAGING — MR MR LUMBAR SPINE W/O CM
4 of 5 series · 30 of 48 positions shown · non-contrast
Comparison: None.

CLINICAL DATA: Trauma

EXAM:
MRI LUMBAR SPINE WITHOUT CONTRAST
TECHNIQUE: Multiplanar, multisequence MR imaging of the lumbar spine was
performed. No intravenous contrast was administered.

[Series 5: T1 · sagittal · 4.0mm · 0.81mm/px · 5 of 17 slices shown (1 of 2)]
[im 1/17]
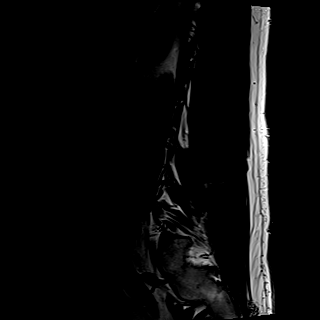
[im 5/17]
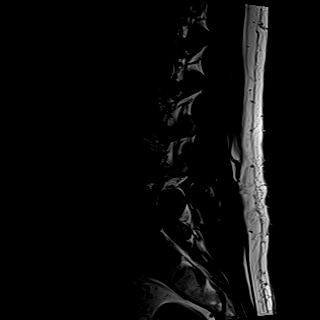
[im 9/17]
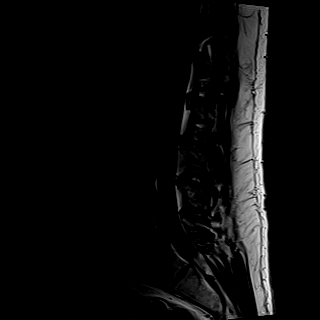
[im 13/17]
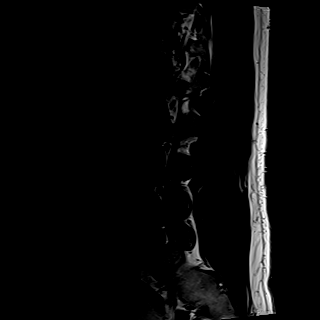
[im 17/17]
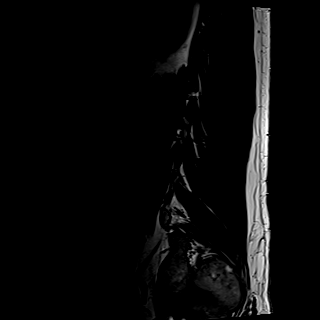

[Series 6: T2 · sagittal · 4.0mm · 0.81mm/px · 5 of 17 slices shown (1 of 2)]
[im 1/17]
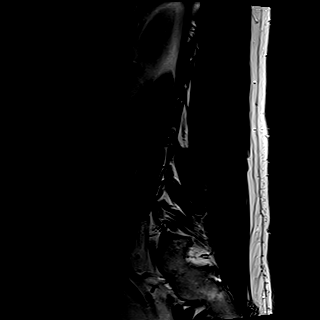
[im 5/17]
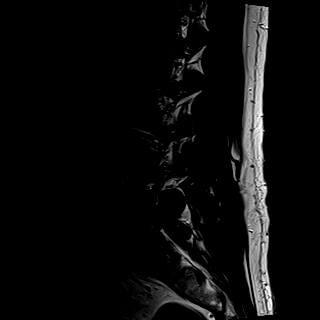
[im 9/17]
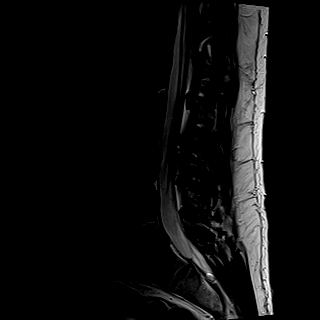
[im 13/17]
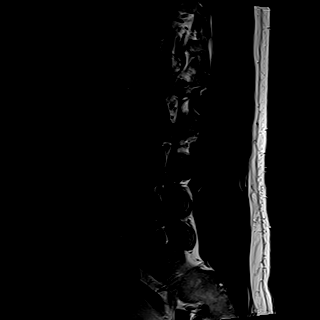
[im 17/17]
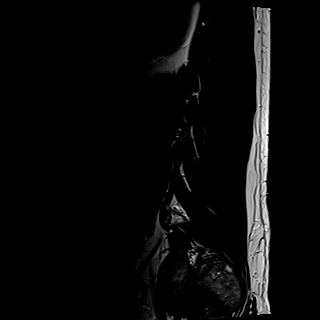

[Series 8: T2 · axial · 4.0mm · 0.62mm/px · z∈[-98,+135]mm · 10 of 48 slices shown (2 of 2)]
[im 4/48]
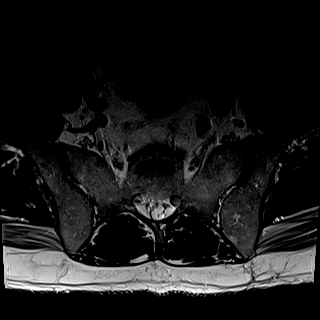
[im 7/48]
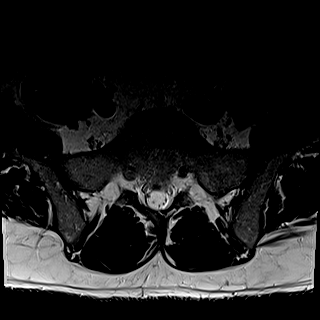
[im 10/48]
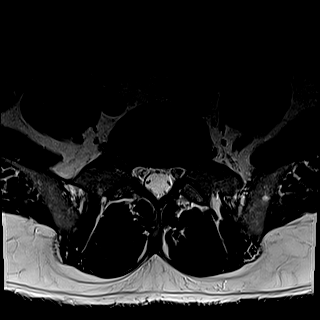
[im 16/48]
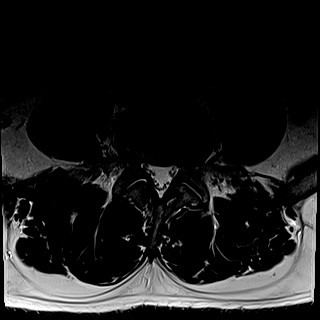
[im 22/48]
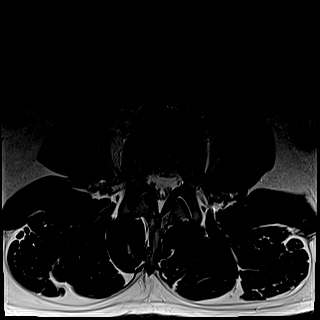
[im 26/48]
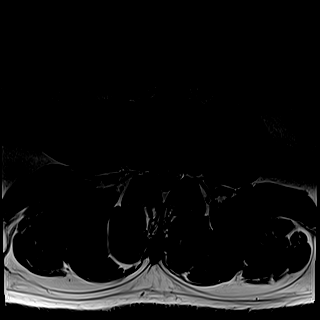
[im 29/48]
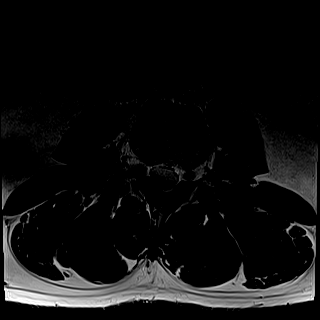
[im 35/48]
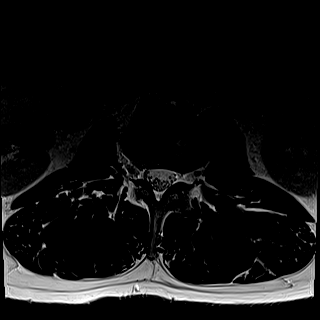
[im 41/48]
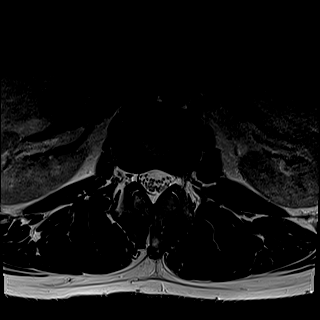
[im 48/48]
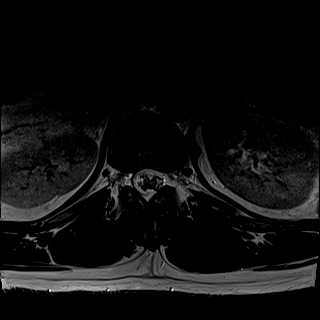

[Series 9: T1 · axial · 4.0mm · 0.39mm/px · z∈[-98,+135]mm · 10 of 48 slices shown (2 of 2)]
[im 4/48]
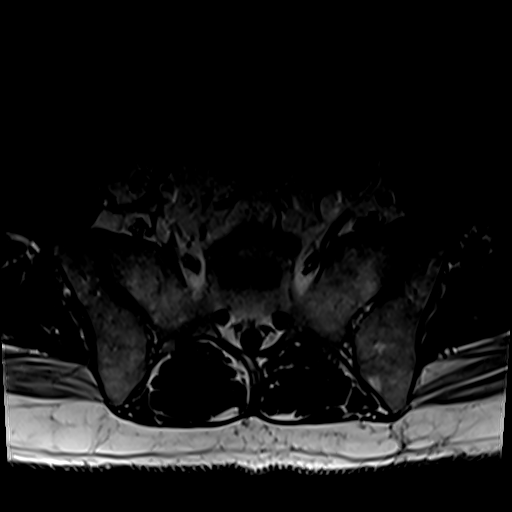
[im 7/48]
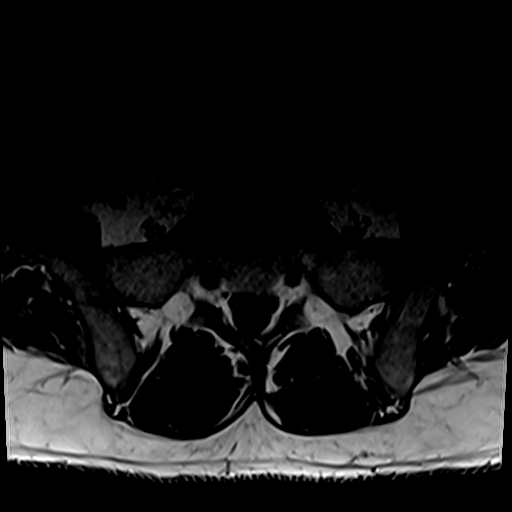
[im 10/48]
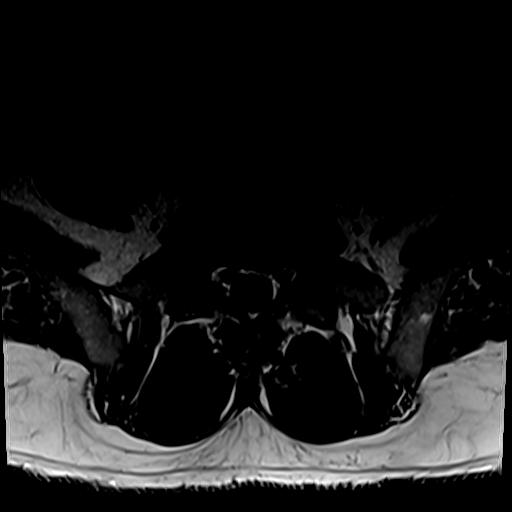
[im 16/48]
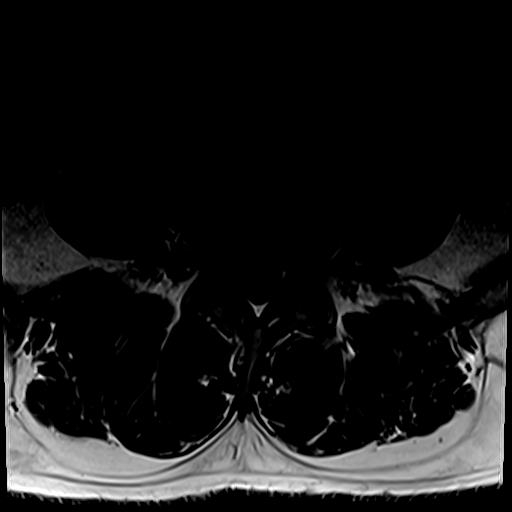
[im 22/48]
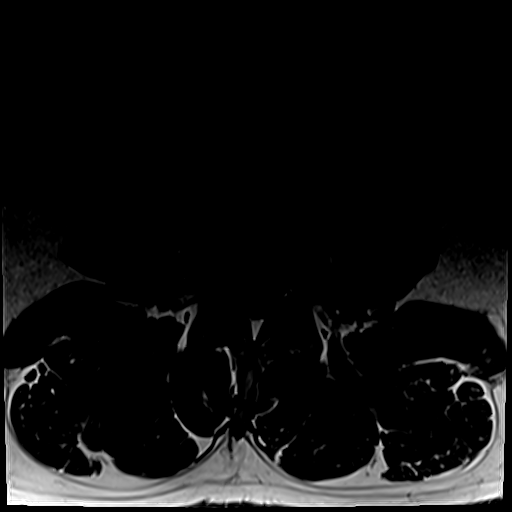
[im 26/48]
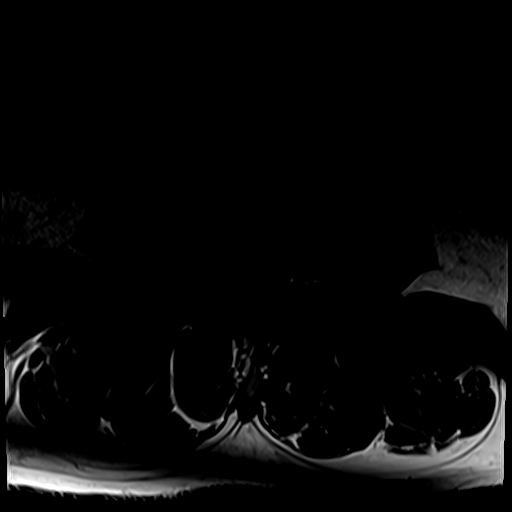
[im 29/48]
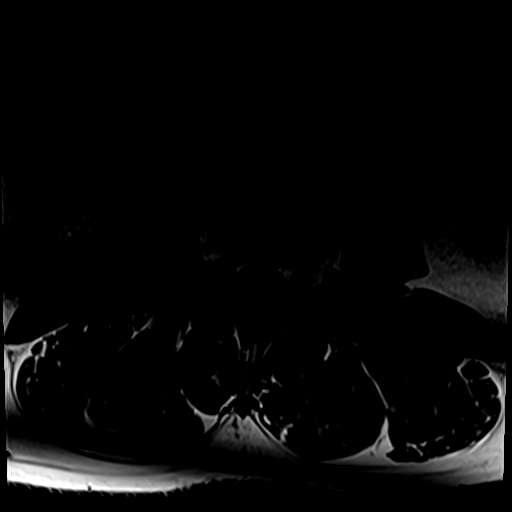
[im 35/48]
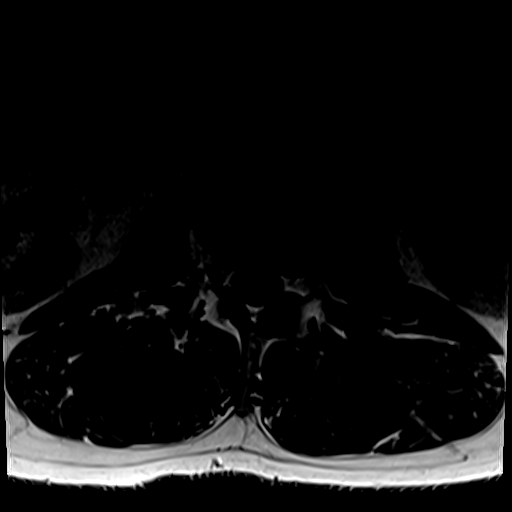
[im 41/48]
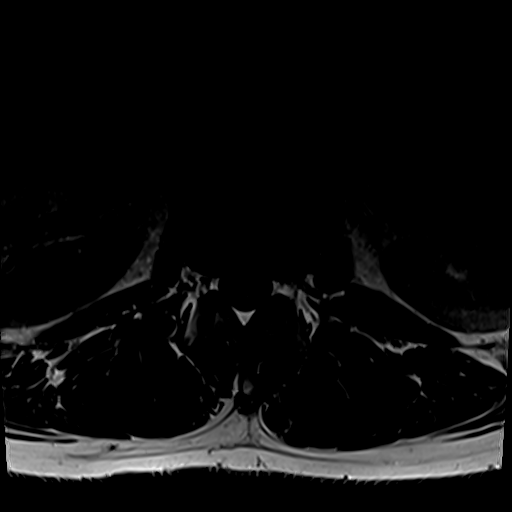
[im 48/48]
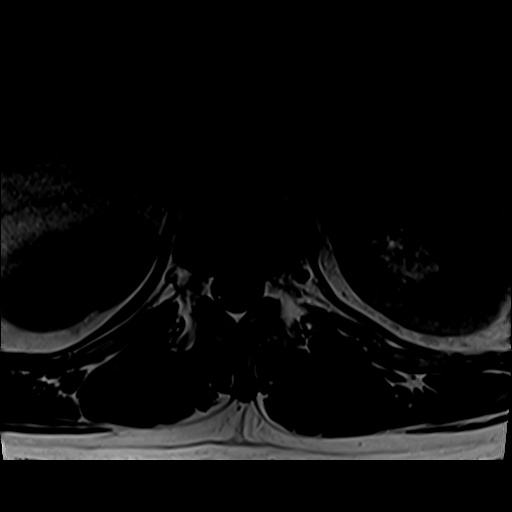

[30 of 48 positions shown; findings below may reference images not displayed]

FINDINGS: Segmentation:  Standard

Alignment:  Normal

Vertebrae:  No fracture, evidence of discitis, or bone lesion.

Conus medullaris and cauda equina: Conus extends to the L1 level.
Conus and cauda equina appear normal.

Paraspinal and other soft tissues: Negative

Disc levels:

L1-L2: Normal disc space and facet joints. No spinal canal stenosis.
No neural foraminal stenosis.

L2-L3: Normal disc space and facet joints. No spinal canal stenosis.
No neural foraminal stenosis.

L3-L4: Disc desiccation with mild bulge. No spinal canal stenosis.
No neural foraminal stenosis.

L4-L5: Normal disc space and facet joints. No spinal canal stenosis.
No neural foraminal stenosis.

L5-S1: Disc desiccation and small left subarticular disc protrusion.
Left lateral recess narrowing without central spinal canal stenosis.
Moderate left neural foraminal stenosis.

Visualized sacrum: Normal.
IMPRESSION: 1. No acute abnormality of the lumbar spine.
2. L5-S1 left subarticular disc protrusion with narrowing of the
left lateral recess and moderate left neural foraminal stenosis,
which may serve as a source of left L5 and/or S1 radiculopathy.
3. Mild L3-4 degenerative disc disease without stenosis.

## 2021-08-02 IMAGING — MR MR CERVICAL SPINE W/O CM
5 of 6 series · 34 of 48 positions shown · non-contrast
Comparison: None.

CLINICAL DATA: Trauma

EXAM:
MRI CERVICAL SPINE WITHOUT CONTRAST
TECHNIQUE: Multiplanar, multisequence MR imaging of the cervical spine was
performed. No intravenous contrast was administered.

[Series 5: T1 · sagittal · 3.0mm · 0.69mm/px · 5 of 15 slices shown (1 of 2)]
[im 1/15]
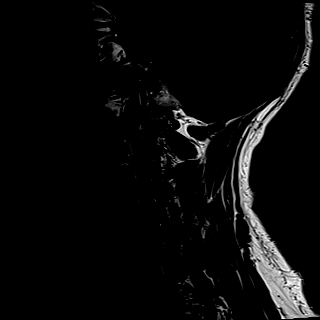
[im 4/15]
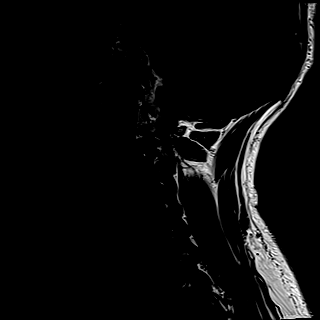
[im 8/15]
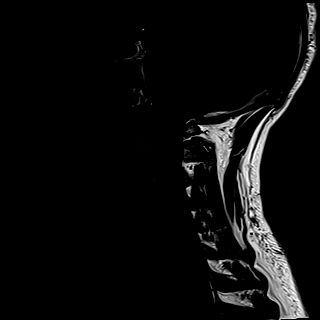
[im 11/15]
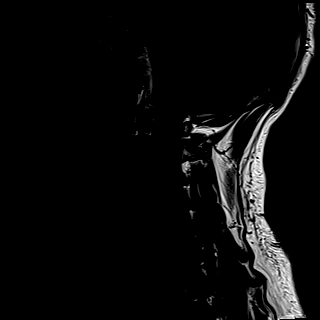
[im 15/15]
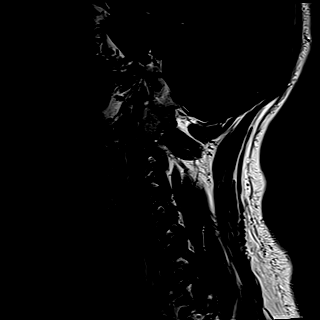

[Series 6: T2 · sagittal · 3.0mm · 0.69mm/px · 5 of 15 slices shown (1 of 2)]
[im 1/15]
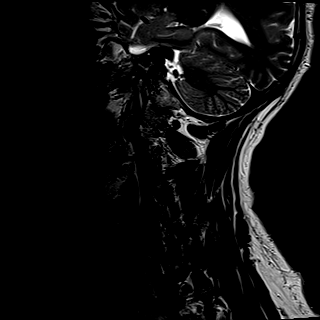
[im 4/15]
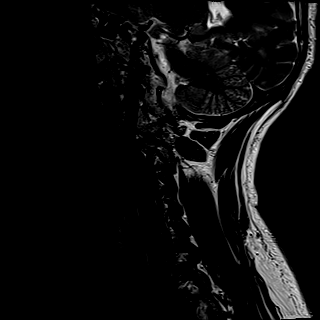
[im 8/15]
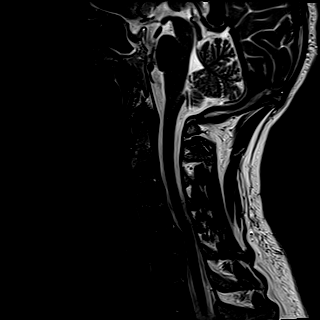
[im 11/15]
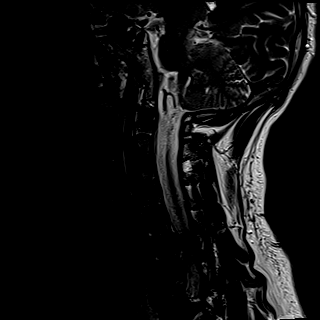
[im 15/15]
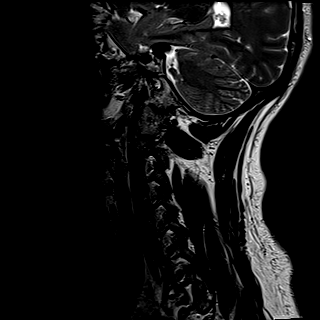

[Series 7: STIR · sagittal · 3.0mm · 0.86mm/px · 2 of 15 slices shown]
[im 1/15]
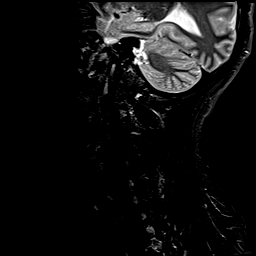
[im 4/15]
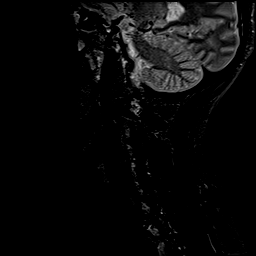

[Series 8: T2 · axial · 3.0mm · 0.70mm/px · z∈[-119,-21]mm · 11 of 30 slices shown (2 of 2)]
[im 1/30]
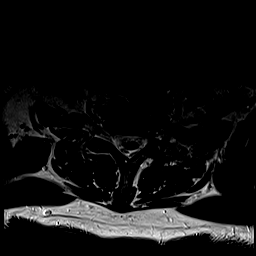
[im 3/30]
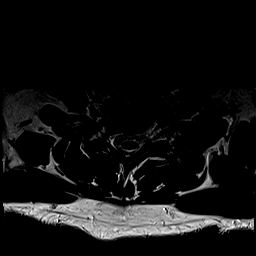
[im 6/30]
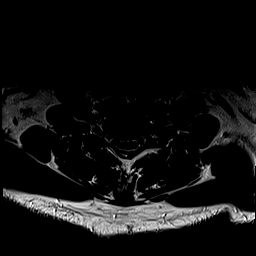
[im 9/30]
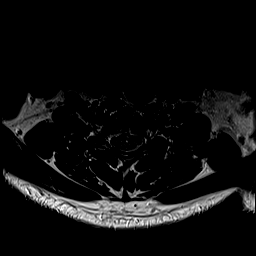
[im 12/30]
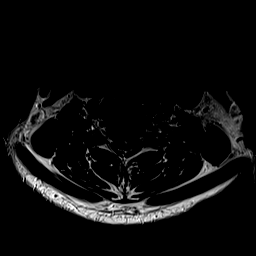
[im 15/30]
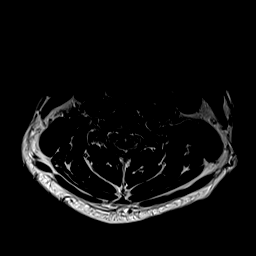
[im 18/30]
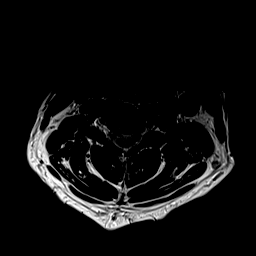
[im 21/30]
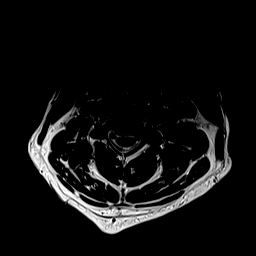
[im 24/30]
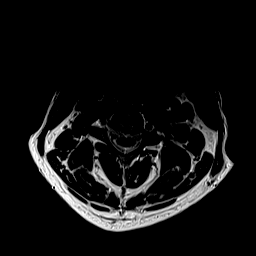
[im 27/30]
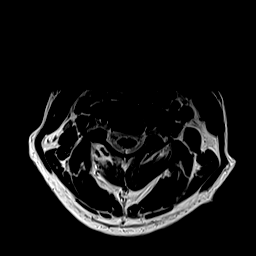
[im 30/30]
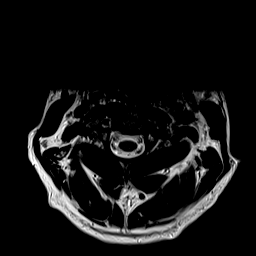

[Series 10: T1 · axial · 3.0mm · 0.35mm/px · z∈[-119,-21]mm · 11 of 30 slices shown (2 of 2)]
[im 1/30]
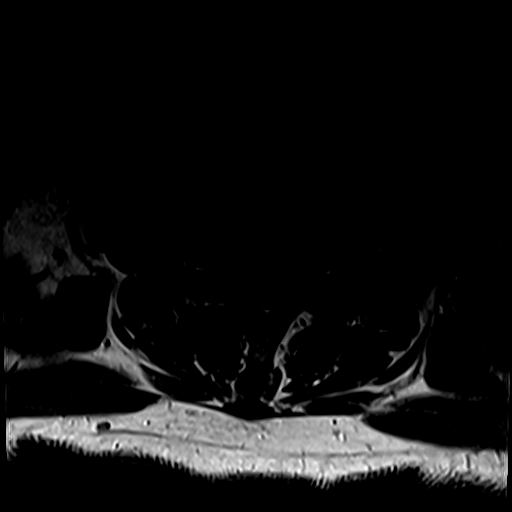
[im 3/30]
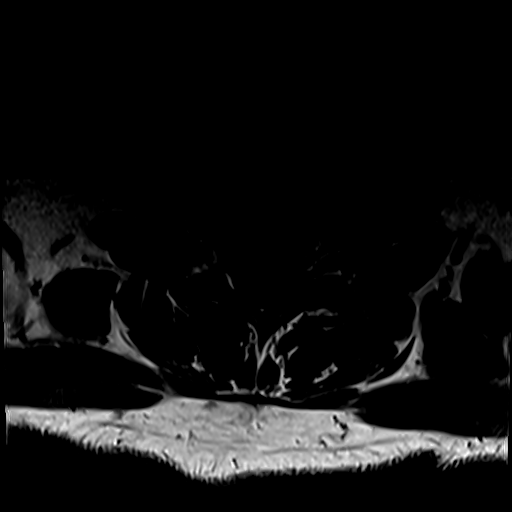
[im 6/30]
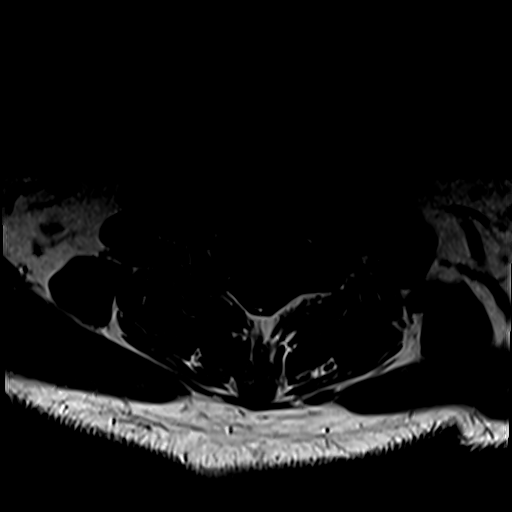
[im 9/30]
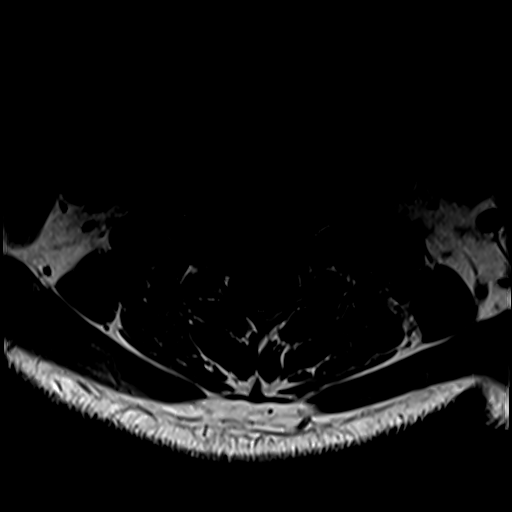
[im 12/30]
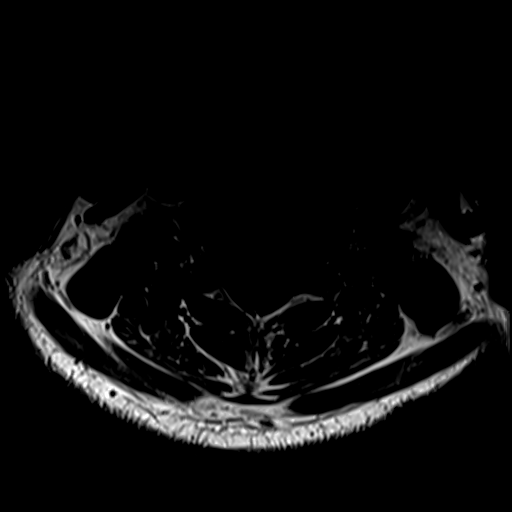
[im 15/30]
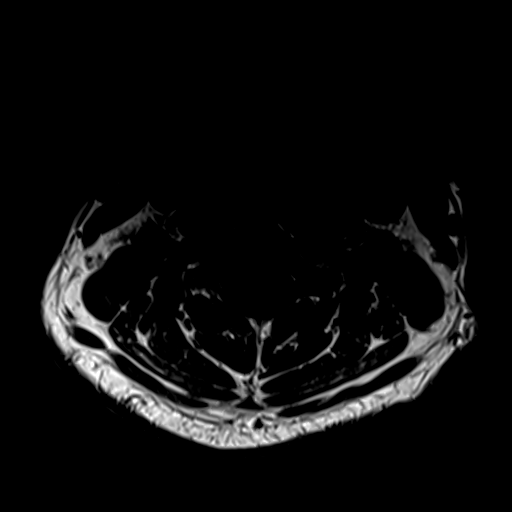
[im 18/30]
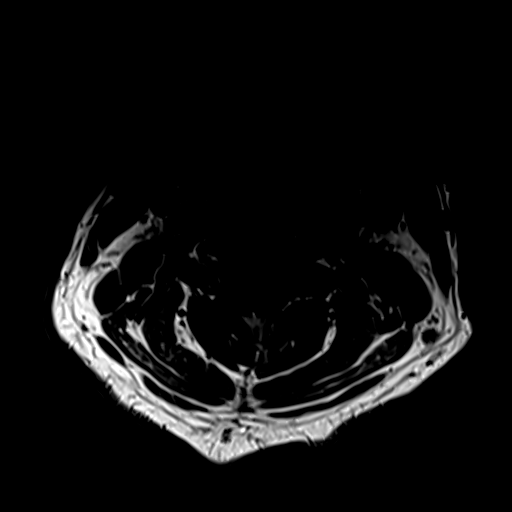
[im 21/30]
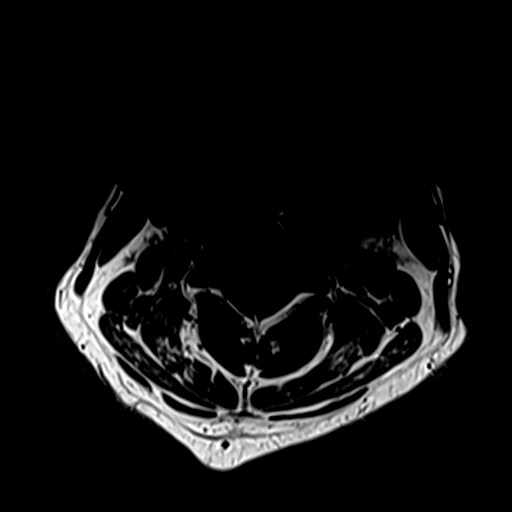
[im 24/30]
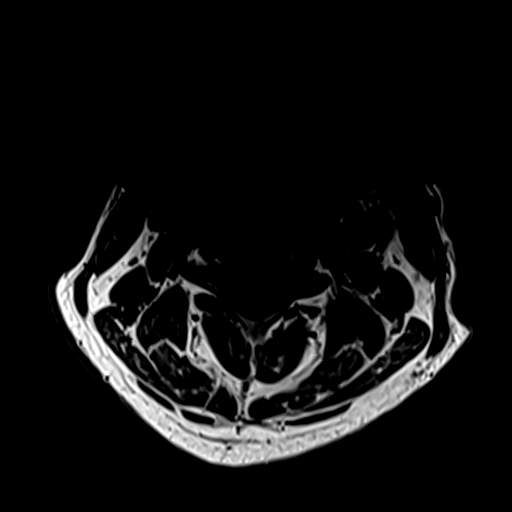
[im 27/30]
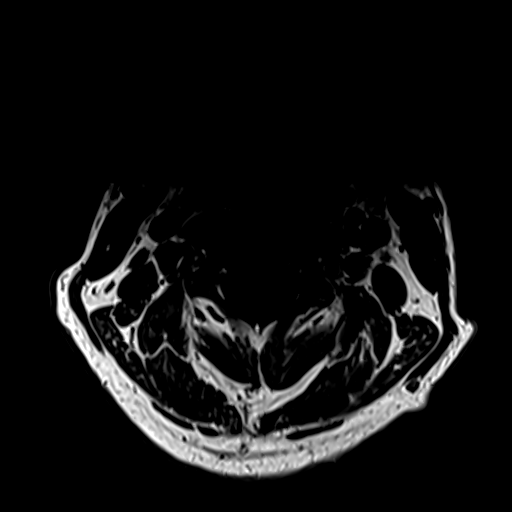
[im 30/30]
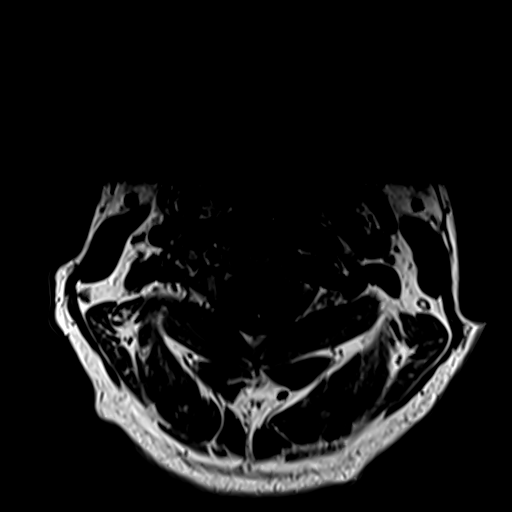

[34 of 48 positions shown; findings below may reference images not displayed]

FINDINGS: Alignment: Normal

Vertebrae: No fracture, evidence of discitis, or bone lesion.

Cord: Normal signal and morphology.

Posterior Fossa, vertebral arteries, paraspinal tissues: Negative.

Disc levels:

C1-2: Unremarkable.

C2-3: Normal disc space and facet joints. There is no spinal canal
stenosis. No neural foraminal stenosis.

C3-4: Normal disc space and facet joints. There is no spinal canal
stenosis. No neural foraminal stenosis.

C4-5: Normal disc space and facet joints. There is no spinal canal
stenosis. No neural foraminal stenosis.

C5-6: Large bilobed disc extrusion with superior migration to the
pedicular level. Mild spinal canal stenosis. Moderate right and mild
left neural foraminal stenosis.

C6-7: Normal disc space and facet joints. There is no spinal canal
stenosis. No neural foraminal stenosis.

C7-T1: Normal disc space and facet joints. There is no spinal canal
stenosis. No neural foraminal stenosis.
IMPRESSION: 1. No acute fracture or ligamentous injury of the cervical spine.
2. Large bilobed disc extrusion at C5-6 with superior migration to
the pedicular level. Mild spinal canal stenosis and moderate right,
mild left neural foraminal stenosis at this level.

## 2021-08-02 MED ORDER — METHOCARBAMOL 500 MG PO TABS: 500.0000 mg | ORAL_TABLET | Freq: Two times a day (BID) | ORAL | 0 refills | Status: DC

## 2021-08-02 MED ORDER — MORPHINE SULFATE (PF) 4 MG/ML IV SOLN
8.0000 mg | Freq: Once | INTRAVENOUS | Status: AC | PRN
  Administered 2021-08-02: 8 mg via INTRAVENOUS
  Filled 2021-08-02: qty 2

## 2021-08-02 MED ORDER — ACETAMINOPHEN 500 MG PO TABS: 500.0000 mg | ORAL_TABLET | Freq: Four times a day (QID) | ORAL | 0 refills | Status: DC | PRN

## 2021-08-02 MED ORDER — FENTANYL CITRATE PF 50 MCG/ML IJ SOSY
100.0000 ug | PREFILLED_SYRINGE | Freq: Once | INTRAMUSCULAR | Status: AC
  Administered 2021-08-02: 100 ug via INTRAVENOUS
  Filled 2021-08-02: qty 2

## 2021-08-02 MED ORDER — PREDNISONE 10 MG PO TABS: 50.0000 mg | ORAL_TABLET | Freq: Every day | ORAL | 0 refills | Status: DC

## 2021-08-02 MED ORDER — IBUPROFEN 600 MG PO TABS: 600.0000 mg | ORAL_TABLET | Freq: Four times a day (QID) | ORAL | 0 refills | Status: DC | PRN

## 2021-08-02 MED ORDER — OXYCODONE-ACETAMINOPHEN 5-325 MG PO TABS: 1.0000 | ORAL_TABLET | Freq: Two times a day (BID) | ORAL | 0 refills | Status: DC

## 2021-08-02 MED ORDER — IOHEXOL 350 MG/ML SOLN
80.0000 mL | Freq: Once | INTRAVENOUS | Status: AC | PRN
  Administered 2021-08-02: 80 mL via INTRAVENOUS

## 2021-08-02 MED ORDER — KETOROLAC TROMETHAMINE 15 MG/ML IJ SOLN
15.0000 mg | Freq: Once | INTRAMUSCULAR | Status: AC
  Administered 2021-08-02: 15 mg via INTRAVENOUS
  Filled 2021-08-02: qty 1

## 2021-08-02 MED ORDER — ACETAMINOPHEN 500 MG PO TABS: 500.0000 mg | ORAL_TABLET | Freq: Four times a day (QID) | ORAL | 0 refills | Status: AC | PRN

## 2021-08-02 MED ORDER — DIAZEPAM 5 MG PO TABS
5.0000 mg | ORAL_TABLET | Freq: Once | ORAL | Status: AC
  Administered 2021-08-02: 5 mg via ORAL
  Filled 2021-08-02: qty 1

## 2021-08-02 NOTE — Discharge Instructions (Addendum)
You are seen in the ER after you had a fall.  Given the mechanism of your fall that was concerning along with neurologic symptoms and pain with breathing, extensive diagnostic work-up was undertaken.  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  Medications are prescribed for symptom control.  We recommend that he follow-up with neurosurgeon as requested in 2 weeks.

## 2021-08-02 NOTE — ED Triage Notes (Signed)
Patient states he fell off a 12 foot ladder off a roof and the roofing fell on top of him. Has urinated since, w/ pain. Complains of R lower back pain, numbness in thigh and R toes. Now numbness in L toes. Ambulatory w. Pain. Abd pain as well. States he did not hit his head, denies LOC. Lac to R wrist.

## 2021-08-02 NOTE — ED Provider Notes (Addendum)
[place] DEPT Provider Note   CSN: [id number] Arrival date & time: 08/02/21  4098     History Chief Complaint  Patient presents with   Fall   Back Pain   Numbness    Eric Peck is a 45 y.o. male.  HPI    45 year old male comes in with chief complaint of accident/injury.  Patient was on a ladder, about 6 to 8 feet height, when a heavy 300 400 pound structure struck him leading him to fall.  Patient fell on his backwards and the structure fell on him to.   Patient immediately got up and has ambulated.  He is however having pain over his back diffusely, numbness over the right leg, chest pain with deep inspiration posteriorly.  Patient denies LOC.  Unsure if he struck his head.  Past Medical History:  Diagnosis Date   Anxiety    Arthritis    CTS (carpal tunnel syndrome)    Diverticulitis of large intestine with perforation 03/2015   microperf of sigmoid colitis, tx with abx, no surg   GERD (gastroesophageal reflux disease)    Hypertension 11/06/2016    Patient Active Problem List   Diagnosis Date Noted   GERD (gastroesophageal reflux disease)    Leukocytosis    Hypertension 11/06/2016   Tobacco abuse 05/24/2016   Constipation 05/24/2016   Chronic RUQ pain 04/12/2016   Diverticulitis of colon 03/12/2015    Past Surgical History:  Procedure Laterality Date   COLONOSCOPY N/A 04/23/2015   Procedure: DIAGNOSTIC COLONOSCOPY;  Surgeon: [name], MD;  Location: [facility];  Service: Endoscopy;  Laterality: N/A;   VASECTOMY         Family History  Problem Relation Age of Onset   COPD Mother    Inflammatory bowel disease Mother    Cystic fibrosis Mother    Irritable bowel syndrome Mother    Alcohol abuse Mother    Diabetes Father    Kidney disease Father    Eczema Brother    Irritable bowel syndrome Brother    Mental retardation Brother        Down's Syndrome   Breast cancer Maternal Grandmother    Colitis Maternal  Grandmother    Breast cancer Paternal Grandmother    Prostate cancer Paternal Uncle        x 2   Crohn's disease Cousin        x 2   Esophageal cancer Paternal Grandfather    Colon cancer Neg Hx    Rectal cancer Neg Hx    Stomach cancer Neg Hx     Social History   Tobacco Use   Smoking status: Former    Packs/day: 1.50    Types: Cigarettes   Smokeless tobacco: Never   Tobacco comments:    form given 08-02-16  Substance Use Topics   Alcohol use: No   Drug use: Yes    Frequency: 5.0 times per week    Types: Marijuana    Comment: or more    Home Medications Prior to Admission medications   Medication Sig Start Date End Date Taking? Authorizing Provider  acetaminophen (TYLENOL) 500 MG tablet Take 1 tablet (500 mg total) by mouth every 6 (six) hours as needed. 08/02/21  Yes Varney Biles, MD  ibuprofen (ADVIL) 600 MG tablet Take 1 tablet (600 mg total) by mouth every 6 (six) hours as needed. 08/02/21  Yes Varney Biles, MD  methocarbamol (ROBAXIN) 500 MG tablet Take 1 tablet (500 mg total)  by mouth 2 (two) times daily. 08/02/21  Yes Varney Biles, MD  oxyCODONE-acetaminophen (PERCOCET/ROXICET) 5-325 MG tablet Take 1 tablet by mouth every 12 (twelve) hours. 08/02/21  Yes Varney Biles, MD  predniSONE (DELTASONE) 10 MG tablet Take 5 tablets (50 mg total) by mouth daily. 08/02/21  Yes Varney Biles, MD  acetaminophen (TYLENOL) 500 MG tablet Take 1 tablet (500 mg total) by mouth every 6 (six) hours as needed for moderate pain. 08/02/21   Varney Biles, MD    Allergies    Patient has no known allergies.  Review of Systems   Review of Systems  Constitutional:  Positive for activity change.  Cardiovascular:  Positive for chest pain.  Gastrointestinal:  Positive for abdominal pain.  Musculoskeletal:  Positive for back pain.  Neurological:  Positive for numbness.  All other systems reviewed and are negative.  Physical Exam Updated Vital Signs BP 140/85   Pulse 62   Temp  98.5 F (36.9 C) (Oral)   Resp 15   Ht 6' (1.829 m)   Wt 104.3 kg   SpO2 100%   BMI 31.19 kg/m   Physical Exam Vitals and nursing note reviewed.  Constitutional:      Appearance: He is well-developed.  HENT:     Head: Atraumatic.  Cardiovascular:     Rate and Rhythm: Normal rate.  Pulmonary:     Effort: Pulmonary effort is normal.  Musculoskeletal:     Cervical back: Neck supple.     Comments: Right lower extremity numbness over the foot and leg, described as paresthesia, also has mild right upper extremity numbness over hand.  Diffuse tenderness over the lumbar, thoracic, and lower cervical spine  Skin:    General: Skin is warm.  Neurological:     Mental Status: He is alert and oriented to person, place, and time.     Sensory: Sensory deficit present.    ED Results / Procedures / Treatments   Labs (all labs ordered are listed, but only abnormal results are displayed) Labs Reviewed  COMPREHENSIVE METABOLIC PANEL - Abnormal; Notable for the following components:      Result Value   Potassium 3.2 (*)    All other components within normal limits  URINALYSIS, ROUTINE W REFLEX MICROSCOPIC - Abnormal; Notable for the following components:   Specific Gravity, Urine >1.046 (*)    All other components within normal limits  I-STAT CHEM 8, ED - Abnormal; Notable for the following components:   Potassium 3.3 (*)    All other components within normal limits  RESP PANEL BY RT-PCR (FLU A&B, COVID) ARPGX2  CBC  ETHANOL  LACTIC ACID, PLASMA  PROTIME-INR  SAMPLE TO BLOOD BANK    EKG None  Radiology CT HEAD WO CONTRAST  Result Date: 08/02/2021 CLINICAL DATA:  Poly trauma EXAM: CT HEAD WITHOUT CONTRAST CT CERVICAL SPINE WITHOUT CONTRAST TECHNIQUE: Multidetector CT imaging of the head and cervical spine was performed following the standard protocol without intravenous contrast. Multiplanar CT image reconstructions of the cervical spine were also generated. COMPARISON:  None.  FINDINGS: CT HEAD FINDINGS BRAIN: BRAIN Patchy and confluent areas of decreased attenuation are noted throughout the deep and periventricular white matter of the cerebral hemispheres bilaterally, compatible with chronic microvascular ischemic disease. No evidence of large-territorial acute infarction. No parenchymal hemorrhage. No mass lesion. No extra-axial collection. No mass effect or midline shift. No hydrocephalus. Basilar cisterns are patent. Vascular: No hyperdense vessel. Skull: No acute fracture or focal lesion. Sinuses/Orbits: Mucosal thickening of the right  maxillary sinus. Otherwise paranasal sinuses and mastoid air cells are clear. The orbits are unremarkable. Other: None. CT CERVICAL SPINE FINDINGS Alignment: Normal. Skull base and vertebrae: No acute fracture. No aggressive appearing focal osseous lesion or focal pathologic process. Soft tissues and spinal canal: No prevertebral fluid or swelling. No visible canal hematoma. Upper chest: Paraseptal emphysematous changes. Other: None. IMPRESSION: 1. Negative for acute traumatic injury. 2. No acute displaced fracture or traumatic listhesis of the cervical spine. 3.  Emphysema (ICD10-J43.9). Electronically Signed   By: Iven Finn M.D.   On: 08/02/2021 19:21   CT CHEST W CONTRAST  Result Date: 08/02/2021 CLINICAL DATA:  Chest and abdominal trauma.  Status post fall EXAM: CT CHEST, ABDOMEN, AND PELVIS WITH CONTRAST TECHNIQUE: Multidetector CT imaging of the chest, abdomen and pelvis was performed following the standard protocol during bolus administration of intravenous contrast. CONTRAST:  29mL OMNIPAQUE IOHEXOL 350 MG/ML SOLN COMPARISON:  CT abdomen pelvis 10/30/2016, CT abdomen pelvis 09/03/2019 FINDINGS: CT HEAD FINDINGS Brain: No evidence of large-territorial acute infarction. No parenchymal hemorrhage. No mass lesion. No extra-axial collection. No mass effect or midline shift. No hydrocephalus. Basilar cisterns are patent. Vascular: No  hyperdense vessel. Skull: No acute fracture or focal lesion. Sinuses/Orbits: Paranasal sinuses and mastoid air cells are clear. The orbits are unremarkable. Other: None. CT CERVICAL FINDINGS Alignment: Normal. Skull base and vertebrae: No acute fracture. No aggressive appearing focal osseous lesion or focal pathologic process. Soft tissues and spinal canal: No prevertebral fluid or swelling. No visible canal hematoma. Upper chest: Unremarkable. Other: None. CT CHEST FINDINGS Ports and Devices: None. Lungs/airways: No focal consolidation. Scattered peribronchovascular over nodules. There is a 5 mm right upper lobe pulmonary nodule (5:68). No pulmonary mass. No pulmonary contusion or laceration. No pneumatocele formation. The central airways are patent. Pleura: No pleural effusion. No pneumothorax. No hemothorax. Lymph Nodes: No mediastinal, hilar, or axillary lymphadenopathy. Mediastinum: No pneumomediastinum. No aortic injury or mediastinal hematoma. The thoracic aorta is normal in caliber. The heart is normal in size. No significant pericardial effusion. The esophagus is unremarkable. The thyroid is unremarkable. Chest Wall / Breasts: No chest wall mass. Musculoskeletal: No acute rib or sternal fracture. Please see separately dictated CT thoracic spine 08/02/2021. CT ABDOMEN AND PELVIS FINDINGS Liver: Not enlarged. No focal lesion. No laceration or subcapsular hematoma. Biliary System: The gallbladder is otherwise unremarkable with no radio-opaque gallstones. No biliary ductal dilatation. Pancreas: Normal pancreatic contour. No main pancreatic duct dilatation. Spleen: Not enlarged. No focal lesion. No laceration, subcapsular hematoma, or vascular injury. A splenule is noted. Adrenal Glands: No nodularity bilaterally. Kidneys: Bilateral kidneys enhance symmetrically. No hydronephrosis. No contusion, laceration, or subcapsular hematoma. No injury to the vascular structures or collecting systems. No hydroureter. The  urinary bladder is unremarkable. Bowel: No small bowel wall thickening or dilatation. Bowel wall thickening of the descending colon likely due to under distension. There is however bowel wall thickening of the in the setting of diverticulosis. Trace pericolonic fat stranding. Proximal sigmoid colon. The appendix is unremarkable. Mesentery, Omentum, and Peritoneum: No simple free fluid ascites. No pneumoperitoneum. No hemoperitoneum. No mesenteric hematoma identified. No organized fluid collection. Pelvic Organs: Normal. Lymph Nodes: No abdominal, pelvic, inguinal lymphadenopathy. Vasculature: No abdominal aorta or iliac aneurysm. No active contrast extravasation or pseudoaneurysm. Musculoskeletal: No significant soft tissue hematoma. No acute pelvic fracture. Please see separately dictated CT lumbar spine 08/02/2021. IMPRESSION: 1.  No acute traumatic injury to the chest, abdomen, or pelvis. 2. No acute fracture or traumatic malalignment  of the thoracic or lumbar spine. Other imaging findings of potential clinical significance: 1. Pulmonary findings suggestive of atypical pneumonia. 2. Scattered pulmonary micronodules as well as a 5 mm right upper lobe pulmonary nodule. No follow-up needed if patient is low-risk (and has no known or suspected primary neoplasm). Non-contrast chest CT can be considered in 12 months if patient is high-risk. This recommendation follows the consensus statement: Guidelines for Management of Incidental Pulmonary Nodules Detected on CT Images: From the Fleischner Society 2017; Radiology 2017; 284:228-243. 3. Colonic diverticulosis with possible early/mild uncomplicated acute sigmoid diverticulitis. Consider colonoscopy status post treatment and status post complete resolution of inflammatory changes to exclude an underlying lesion. Electronically Signed   By: Iven Finn M.D.   On: 08/02/2021 19:17   CT CERVICAL SPINE WO CONTRAST  Result Date: 08/02/2021 CLINICAL DATA:  Poly trauma  EXAM: CT HEAD WITHOUT CONTRAST CT CERVICAL SPINE WITHOUT CONTRAST TECHNIQUE: Multidetector CT imaging of the head and cervical spine was performed following the standard protocol without intravenous contrast. Multiplanar CT image reconstructions of the cervical spine were also generated. COMPARISON:  None. FINDINGS: CT HEAD FINDINGS BRAIN: BRAIN Patchy and confluent areas of decreased attenuation are noted throughout the deep and periventricular white matter of the cerebral hemispheres bilaterally, compatible with chronic microvascular ischemic disease. No evidence of large-territorial acute infarction. No parenchymal hemorrhage. No mass lesion. No extra-axial collection. No mass effect or midline shift. No hydrocephalus. Basilar cisterns are patent. Vascular: No hyperdense vessel. Skull: No acute fracture or focal lesion. Sinuses/Orbits: Mucosal thickening of the right maxillary sinus. Otherwise paranasal sinuses and mastoid air cells are clear. The orbits are unremarkable. Other: None. CT CERVICAL SPINE FINDINGS Alignment: Normal. Skull base and vertebrae: No acute fracture. No aggressive appearing focal osseous lesion or focal pathologic process. Soft tissues and spinal canal: No prevertebral fluid or swelling. No visible canal hematoma. Upper chest: Paraseptal emphysematous changes. Other: None. IMPRESSION: 1. Negative for acute traumatic injury. 2. No acute displaced fracture or traumatic listhesis of the cervical spine. 3.  Emphysema (ICD10-J43.9). Electronically Signed   By: Iven Finn M.D.   On: 08/02/2021 19:21   MR Cervical Spine Wo Contrast  Result Date: 08/02/2021 CLINICAL DATA:  Trauma EXAM: MRI CERVICAL SPINE WITHOUT CONTRAST TECHNIQUE: Multiplanar, multisequence MR imaging of the cervical spine was performed. No intravenous contrast was administered. COMPARISON:  None. FINDINGS: Alignment: Normal Vertebrae: No fracture, evidence of discitis, or bone lesion. Cord: Normal signal and morphology.  Posterior Fossa, vertebral arteries, paraspinal tissues: Negative. Disc levels: C1-2: Unremarkable. C2-3: Normal disc space and facet joints. There is no spinal canal stenosis. No neural foraminal stenosis. C3-4: Normal disc space and facet joints. There is no spinal canal stenosis. No neural foraminal stenosis. C4-5: Normal disc space and facet joints. There is no spinal canal stenosis. No neural foraminal stenosis. C5-6: Large bilobed disc extrusion with superior migration to the pedicular level. Mild spinal canal stenosis. Moderate right and mild left neural foraminal stenosis. C6-7: Normal disc space and facet joints. There is no spinal canal stenosis. No neural foraminal stenosis. C7-T1: Normal disc space and facet joints. There is no spinal canal stenosis. No neural foraminal stenosis. IMPRESSION: 1. No acute fracture or ligamentous injury of the cervical spine. 2. Large bilobed disc extrusion at C5-6 with superior migration to the pedicular level. Mild spinal canal stenosis and moderate right, mild left neural foraminal stenosis at this level. Electronically Signed   By: Ulyses Jarred M.D.   On: 08/02/2021 22:33  MR LUMBAR SPINE WO CONTRAST  Result Date: 08/02/2021 CLINICAL DATA:  Trauma EXAM: MRI LUMBAR SPINE WITHOUT CONTRAST TECHNIQUE: Multiplanar, multisequence MR imaging of the lumbar spine was performed. No intravenous contrast was administered. COMPARISON:  None. FINDINGS: Segmentation:  Standard Alignment:  Normal Vertebrae:  No fracture, evidence of discitis, or bone lesion. Conus medullaris and cauda equina: Conus extends to the L1 level. Conus and cauda equina appear normal. Paraspinal and other soft tissues: Negative Disc levels: L1-L2: Normal disc space and facet joints. No spinal canal stenosis. No neural foraminal stenosis. L2-L3: Normal disc space and facet joints. No spinal canal stenosis. No neural foraminal stenosis. L3-L4: Disc desiccation with mild bulge. No spinal canal stenosis. No  neural foraminal stenosis. L4-L5: Normal disc space and facet joints. No spinal canal stenosis. No neural foraminal stenosis. L5-S1: Disc desiccation and small left subarticular disc protrusion. Left lateral recess narrowing without central spinal canal stenosis. Moderate left neural foraminal stenosis. Visualized sacrum: Normal. IMPRESSION: 1. No acute abnormality of the lumbar spine. 2. L5-S1 left subarticular disc protrusion with narrowing of the left lateral recess and moderate left neural foraminal stenosis, which may serve as a source of left L5 and/or S1 radiculopathy. 3. Mild L3-4 degenerative disc disease without stenosis. Electronically Signed   By: Ulyses Jarred M.D.   On: 08/02/2021 23:35   CT ABDOMEN PELVIS W CONTRAST  Result Date: 08/02/2021 CLINICAL DATA:  Chest and abdominal trauma.  Status post fall EXAM: CT CHEST, ABDOMEN, AND PELVIS WITH CONTRAST TECHNIQUE: Multidetector CT imaging of the chest, abdomen and pelvis was performed following the standard protocol during bolus administration of intravenous contrast. CONTRAST:  66mL OMNIPAQUE IOHEXOL 350 MG/ML SOLN COMPARISON:  CT abdomen pelvis 10/30/2016, CT abdomen pelvis 09/03/2019 FINDINGS: CT HEAD FINDINGS Brain: No evidence of large-territorial acute infarction. No parenchymal hemorrhage. No mass lesion. No extra-axial collection. No mass effect or midline shift. No hydrocephalus. Basilar cisterns are patent. Vascular: No hyperdense vessel. Skull: No acute fracture or focal lesion. Sinuses/Orbits: Paranasal sinuses and mastoid air cells are clear. The orbits are unremarkable. Other: None. CT CERVICAL FINDINGS Alignment: Normal. Skull base and vertebrae: No acute fracture. No aggressive appearing focal osseous lesion or focal pathologic process. Soft tissues and spinal canal: No prevertebral fluid or swelling. No visible canal hematoma. Upper chest: Unremarkable. Other: None. CT CHEST FINDINGS Ports and Devices: None. Lungs/airways: No focal  consolidation. Scattered peribronchovascular over nodules. There is a 5 mm right upper lobe pulmonary nodule (5:68). No pulmonary mass. No pulmonary contusion or laceration. No pneumatocele formation. The central airways are patent. Pleura: No pleural effusion. No pneumothorax. No hemothorax. Lymph Nodes: No mediastinal, hilar, or axillary lymphadenopathy. Mediastinum: No pneumomediastinum. No aortic injury or mediastinal hematoma. The thoracic aorta is normal in caliber. The heart is normal in size. No significant pericardial effusion. The esophagus is unremarkable. The thyroid is unremarkable. Chest Wall / Breasts: No chest wall mass. Musculoskeletal: No acute rib or sternal fracture. Please see separately dictated CT thoracic spine 08/02/2021. CT ABDOMEN AND PELVIS FINDINGS Liver: Not enlarged. No focal lesion. No laceration or subcapsular hematoma. Biliary System: The gallbladder is otherwise unremarkable with no radio-opaque gallstones. No biliary ductal dilatation. Pancreas: Normal pancreatic contour. No main pancreatic duct dilatation. Spleen: Not enlarged. No focal lesion. No laceration, subcapsular hematoma, or vascular injury. A splenule is noted. Adrenal Glands: No nodularity bilaterally. Kidneys: Bilateral kidneys enhance symmetrically. No hydronephrosis. No contusion, laceration, or subcapsular hematoma. No injury to the vascular structures or collecting systems. No hydroureter. The  urinary bladder is unremarkable. Bowel: No small bowel wall thickening or dilatation. Bowel wall thickening of the descending colon likely due to under distension. There is however bowel wall thickening of the in the setting of diverticulosis. Trace pericolonic fat stranding. Proximal sigmoid colon. The appendix is unremarkable. Mesentery, Omentum, and Peritoneum: No simple free fluid ascites. No pneumoperitoneum. No hemoperitoneum. No mesenteric hematoma identified. No organized fluid collection. Pelvic Organs: Normal.  Lymph Nodes: No abdominal, pelvic, inguinal lymphadenopathy. Vasculature: No abdominal aorta or iliac aneurysm. No active contrast extravasation or pseudoaneurysm. Musculoskeletal: No significant soft tissue hematoma. No acute pelvic fracture. Please see separately dictated CT lumbar spine 08/02/2021. IMPRESSION: 1.  No acute traumatic injury to the chest, abdomen, or pelvis. 2. No acute fracture or traumatic malalignment of the thoracic or lumbar spine. Other imaging findings of potential clinical significance: 1. Pulmonary findings suggestive of atypical pneumonia. 2. Scattered pulmonary micronodules as well as a 5 mm right upper lobe pulmonary nodule. No follow-up needed if patient is low-risk (and has no known or suspected primary neoplasm). Non-contrast chest CT can be considered in 12 months if patient is high-risk. This recommendation follows the consensus statement: Guidelines for Management of Incidental Pulmonary Nodules Detected on CT Images: From the Fleischner Society 2017; Radiology 2017; 284:228-243. 3. Colonic diverticulosis with possible early/mild uncomplicated acute sigmoid diverticulitis. Consider colonoscopy status post treatment and status post complete resolution of inflammatory changes to exclude an underlying lesion. Electronically Signed   By: Iven Finn M.D.   On: 08/02/2021 19:17   DG Pelvis Portable  Result Date: 08/02/2021 CLINICAL DATA:  Fall left roof/ladder and roofing fell on top of him. EXAM: PORTABLE PELVIS 1-2 VIEWS COMPARISON:  None. FINDINGS: The cortical margins of the bony pelvis are intact. No fracture. Pubic symphysis and sacroiliac joints are congruent. Both femoral heads are well-seated in the respective acetabula. IMPRESSION: No pelvic fracture. Electronically Signed   By: Keith Rake M.D.   On: 08/02/2021 18:10   CT T-SPINE NO CHARGE  Result Date: 08/02/2021 CLINICAL DATA:  Polytrauma EXAM: CT THORACIC AND LUMBAR SPINE WITHOUT CONTRAST TECHNIQUE:  Multidetector CT imaging of the thoracic and lumbar spine was performed without contrast. Multiplanar CT image reconstructions were also generated. COMPARISON:  None. FINDINGS: CT THORACIC SPINE FINDINGS Alignment: S shaped curvature of the thoracolumbar spine. No listhesis. Vertebrae: No acute fracture or focal pathologic process. Paraspinal and other soft tissues: Please see same-day CT chest abdomen pelvis. Disc levels: No high-grade spinal canal stenosis or neural foraminal narrowing. CT LUMBAR SPINE FINDINGS Segmentation: 5 lumbar type vertebrae. Alignment: S shaped curvature of the thoracolumbar spine. No listhesis. Vertebrae: No acute fracture or focal pathologic practice. Paraspinal and other soft tissues: Please see same-day CT chest abdomen pelvis. Disc levels: No high-grade spinal canal stenosis or neural foraminal narrowing. IMPRESSION: No acute fracture or traumatic subluxation in the thoracic or lumbar spine. For visceral findings, please see same-day CT chest, abdomen pelvis. Electronically Signed   By: Merilyn Baba M.D.   On: 08/02/2021 19:09   CT L-SPINE NO CHARGE  Result Date: 08/02/2021 CLINICAL DATA:  Polytrauma EXAM: CT THORACIC AND LUMBAR SPINE WITHOUT CONTRAST TECHNIQUE: Multidetector CT imaging of the thoracic and lumbar spine was performed without contrast. Multiplanar CT image reconstructions were also generated. COMPARISON:  None. FINDINGS: CT THORACIC SPINE FINDINGS Alignment: S shaped curvature of the thoracolumbar spine. No listhesis. Vertebrae: No acute fracture or focal pathologic process. Paraspinal and other soft tissues: Please see same-day CT chest abdomen pelvis. Disc levels:  No high-grade spinal canal stenosis or neural foraminal narrowing. CT LUMBAR SPINE FINDINGS Segmentation: 5 lumbar type vertebrae. Alignment: S shaped curvature of the thoracolumbar spine. No listhesis. Vertebrae: No acute fracture or focal pathologic practice. Paraspinal and other soft tissues: Please  see same-day CT chest abdomen pelvis. Disc levels: No high-grade spinal canal stenosis or neural foraminal narrowing. IMPRESSION: No acute fracture or traumatic subluxation in the thoracic or lumbar spine. For visceral findings, please see same-day CT chest, abdomen pelvis. Electronically Signed   By: Merilyn Baba M.D.   On: 08/02/2021 19:09   DG Chest Port 1 View  Result Date: 08/02/2021 CLINICAL DATA:  Fall off ladder/roof with roofing falling on top of him. EXAM: PORTABLE CHEST 1 VIEW COMPARISON:  03/12/2015 FINDINGS: The cardiomediastinal contours are normal. The lungs are clear. Pulmonary vasculature is normal. No consolidation, pleural effusion, or pneumothorax. No acute osseous abnormalities are seen. IMPRESSION: No evidence of traumatic injury. Electronically Signed   By: Keith Rake M.D.   On: 08/02/2021 18:10    Procedures Procedures   Medications Ordered in ED Medications  fentaNYL (SUBLIMAZE) injection 100 mcg (100 mcg Intravenous Given 08/02/21 1721)  iohexol (OMNIPAQUE) 350 MG/ML injection 80 mL (80 mLs Intravenous Contrast Given 08/02/21 1827)  ketorolac (TORADOL) 15 MG/ML injection 15 mg (15 mg Intravenous Given 08/02/21 2121)  diazepam (VALIUM) tablet 5 mg (5 mg Oral Given 08/02/21 2122)  morphine 4 MG/ML injection 8 mg (8 mg Intravenous Given 08/02/21 2243)    ED Course  I have reviewed the triage vital signs and the nursing notes.  Pertinent labs & imaging results that were available during my care of the patient were reviewed by me and considered in my medical decision making (see chart for details).    MDM Rules/Calculators/A&P                           45 year old male comes in with chief complaint of fall.  Fall from about 6 to 8 feet height.  Patient fell onto hard surface and a heavy structure fell onto him.  The mechanism is concerning.  Patient is having pleuritic chest pain over the thoracic region posteriorly along with diffuse spine pain.  He is also  having numbness over the right upper and right lower extremity.  C-collar applied.  At this time, we will have to get CT head, C-spine and CT blunt trauma protocol.  Basic labs and chest x-ray also ordered.  Reassessment: CT scans are reassuring.  Patient reports that he still has numbness, the numbness has improved.  Numbness still present. Plan is to get MRI of the cervical spine and lumbar spine given the numbness and paresthesias over both upper and lower extremities.  I do not think a thoracic spine MRI is indicated at this time.  If the work-up is negative then he will be discharged.  Patient made aware of this plan.  11:50 PM The patient appears reasonably screened and/or stabilized for discharge and I doubt any other medical condition or other [facility] requiring further screening, evaluation, or treatment in the ED at this time prior to discharge.   Results from the ER workup discussed with the patient face to face and all questions answered to the best of my ability. The patient is safe for discharge with strict return precautions.   Final Clinical Impression(s) / ED Diagnoses Final diagnoses:  Fall  Herniated disc, cervical  Lumbar herniated disc  Multiple contusions  Rx / DC Orders ED Discharge Orders          Ordered    ibuprofen (ADVIL) 600 MG tablet  Every 6 hours PRN        08/02/21 2347    acetaminophen (TYLENOL) 500 MG tablet  Every 6 hours PRN        08/02/21 2347    acetaminophen (TYLENOL) 500 MG tablet  Every 6 hours PRN        08/02/21 2347    methocarbamol (ROBAXIN) 500 MG tablet  2 times daily        08/02/21 2347    predniSONE (DELTASONE) 10 MG tablet  Daily        08/02/21 2347    oxyCODONE-acetaminophen (PERCOCET/ROXICET) 5-325 MG tablet  Every 12 hours        08/02/21 2349             Varney Biles, MD 08/02/21 7473    Varney Biles, MD 08/02/21 2350

## 2021-08-02 NOTE — ED Notes (Signed)
Patient transported to MRI 

## 2021-08-02 NOTE — ED Notes (Signed)
Pt returned from MRI °

## 2021-10-21 ENCOUNTER — Encounter: Payer: Self-pay | Admitting: Emergency Medicine

## 2021-10-21 ENCOUNTER — Emergency Department
Admission: EM | Admit: 2021-10-21 | Discharge: 2021-10-21 | Disposition: A | Discharge status: Home / Self Care | Payer: Self-pay | Attending: Emergency Medicine | Admitting: Emergency Medicine

## 2021-10-21 ENCOUNTER — Emergency Department: Payer: Self-pay

## 2021-10-21 ENCOUNTER — Other Ambulatory Visit: Payer: Self-pay

## 2021-10-21 DIAGNOSIS — R509 Fever, unspecified: Secondary | ICD-10-CM | POA: Insufficient documentation

## 2021-10-21 DIAGNOSIS — L0211 Cutaneous abscess of neck: Secondary | ICD-10-CM | POA: Insufficient documentation

## 2021-10-21 DIAGNOSIS — L0291 Cutaneous abscess, unspecified: Secondary | ICD-10-CM

## 2021-10-21 DIAGNOSIS — Z20822 Contact with and (suspected) exposure to covid-19: Secondary | ICD-10-CM | POA: Insufficient documentation

## 2021-10-21 LAB — CBC WITH DIFFERENTIAL/PLATELET
Abs Immature Granulocytes: 0.05 10*3/uL (ref 0.00–0.07)
Basophils Absolute: 0.1 10*3/uL (ref 0.0–0.1)
Basophils Relative: 0 %
Eosinophils Absolute: 0.3 10*3/uL (ref 0.0–0.5)
Eosinophils Relative: 3 %
HCT: 46.1 % (ref 39.0–52.0)
Hemoglobin: 15.8 g/dL (ref 13.0–17.0)
Immature Granulocytes: 0 %
Lymphocytes Relative: 16 %
Lymphs Abs: 1.8 10*3/uL (ref 0.7–4.0)
MCH: 29.3 pg (ref 26.0–34.0)
MCHC: 34.3 g/dL (ref 30.0–36.0)
MCV: 85.5 fL (ref 80.0–100.0)
Monocytes Absolute: 0.9 10*3/uL (ref 0.1–1.0)
Monocytes Relative: 8 %
Neutro Abs: 8.3 10*3/uL — ABNORMAL HIGH (ref 1.7–7.7)
Neutrophils Relative %: 73 %
Platelets: 205 10*3/uL (ref 150–400)
RBC: 5.39 MIL/uL (ref 4.22–5.81)
RDW: 13.5 % (ref 11.5–15.5)
WBC: 11.5 10*3/uL — ABNORMAL HIGH (ref 4.0–10.5)
nRBC: 0 % (ref 0.0–0.2)

## 2021-10-21 LAB — COMPREHENSIVE METABOLIC PANEL
ALT: 14 U/L (ref 0–44)
AST: 19 U/L (ref 15–41)
Albumin: 4.2 g/dL (ref 3.5–5.0)
Alkaline Phosphatase: 43 U/L (ref 38–126)
Anion gap: 6 (ref 5–15)
BUN: 12 mg/dL (ref 6–20)
CO2: 23 mmol/L (ref 22–32)
Calcium: 9.1 mg/dL (ref 8.9–10.3)
Chloride: 107 mmol/L (ref 98–111)
Creatinine, Ser: 0.81 mg/dL (ref 0.61–1.24)
GFR, Estimated: >60 mL/min (ref >60)
Glucose, Bld: 92 mg/dL (ref 70–99)
Potassium: 4.5 mmol/L (ref 3.5–5.1)
Sodium: 136 mmol/L (ref 135–145)
Total Bilirubin: 1.8 mg/dL — ABNORMAL HIGH (ref 0.3–1.2)
Total Protein: 7.3 g/dL (ref 6.5–8.1)

## 2021-10-21 LAB — RESP PANEL BY RT-PCR (FLU A&B, COVID) ARPGX2
Influenza A by PCR: NEGATIVE
Influenza B by PCR: NEGATIVE
SARS Coronavirus 2 by RT PCR: NEGATIVE

## 2021-10-21 IMAGING — CT CT NECK W/ CM
5 series · 16 of 33 positions shown, 18 images · IV contrast (agent unspecified)
Comparison: None.

CLINICAL DATA: Non pulsatile neck mass.

EXAM:
CT NECK WITH CONTRAST
TECHNIQUE: Multidetector CT imaging of the neck was performed using the
standard protocol following the bolus administration of intravenous
contrast.

[Series 2: axial neck · axial · 0.62mm/px · z∈[+260,+344]mm · 2 of 128 slices shown]
[im 43/128  bone]
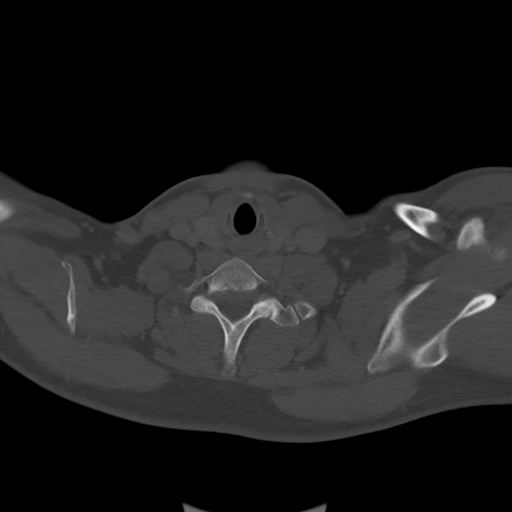
[im 85/128  bone]
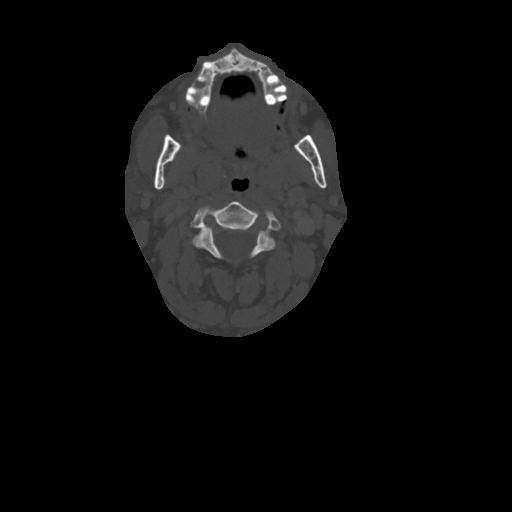

[Series 4: axial bone · axial · 0.62mm/px · z∈[+238,+366]mm · 3 of 128 slices shown, 4 images]
[im 32/128  soft-tissue]
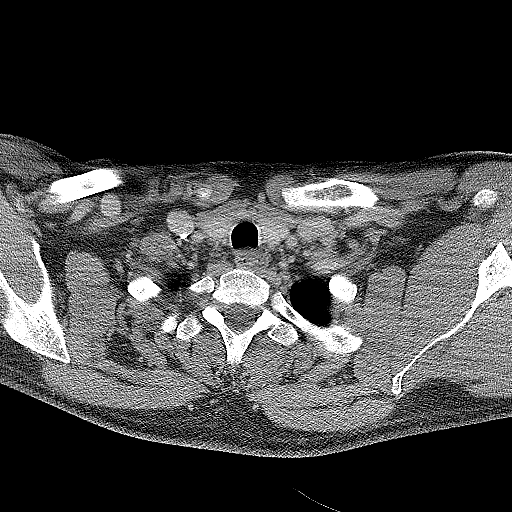
[im 32/128  bone]
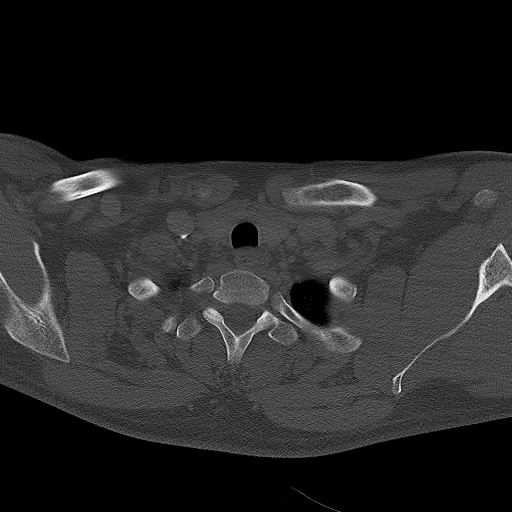
[im 64/128  bone]
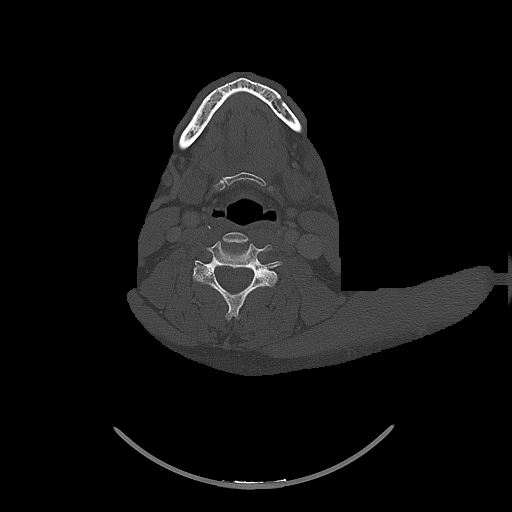
[im 96/128  bone]
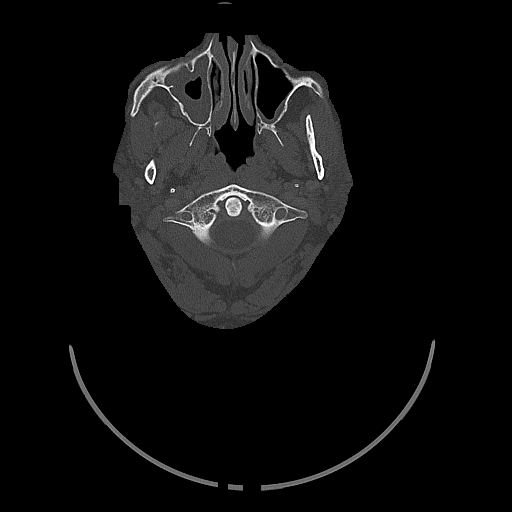

[Series 5: sag neck · sagittal · 0.48mm/px · 5 of 251 slices shown, 6 images]
[im 84/251  bone]
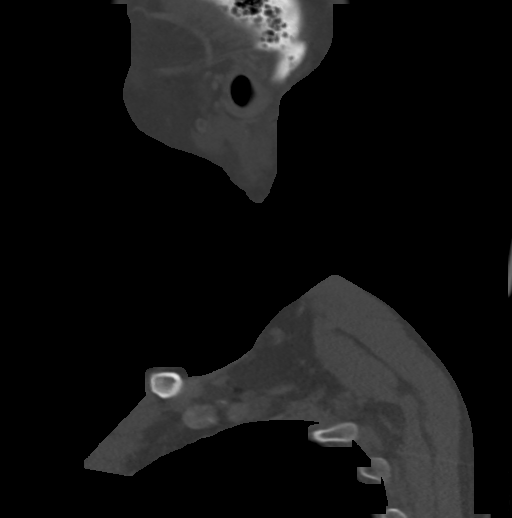
[im 105/251  bone]
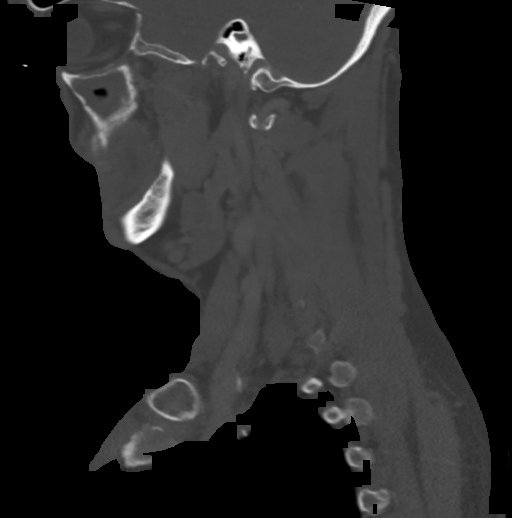
[im 126/251  soft-tissue]
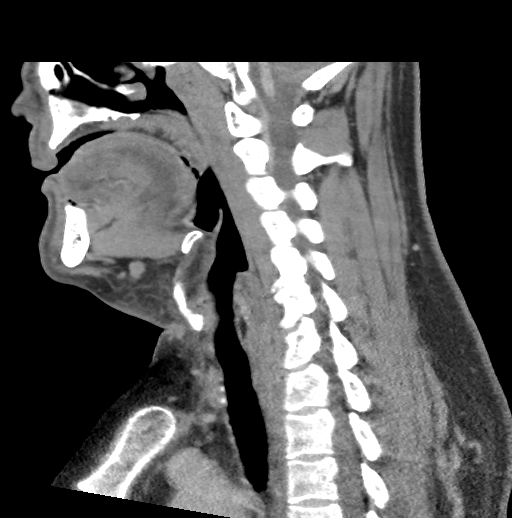
[im 126/251  bone]
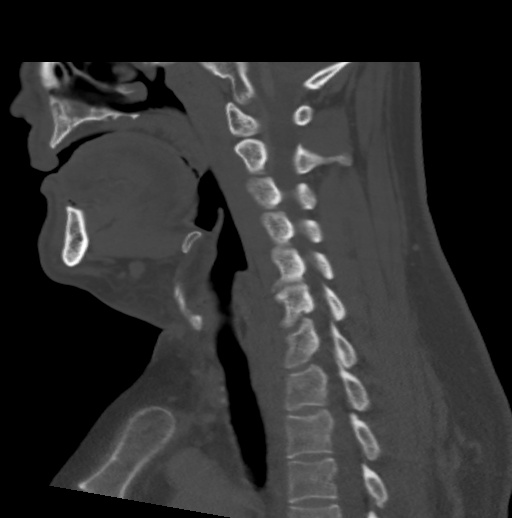
[im 146/251  bone]
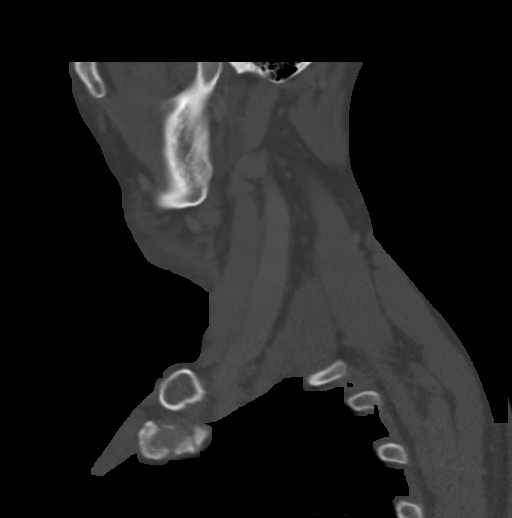
[im 167/251  bone]
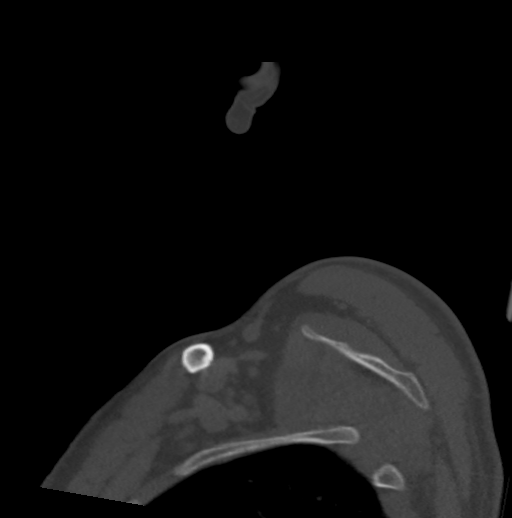

[Series 6: cor neck · coronal · 0.61mm/px · 3 of 139 slices shown]
[im 28/139  bone]
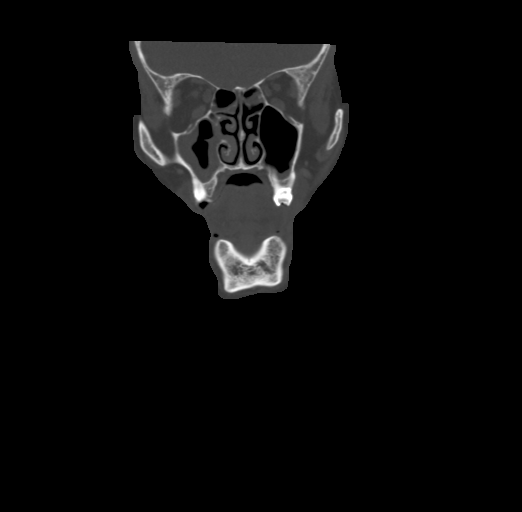
[im 56/139  bone]
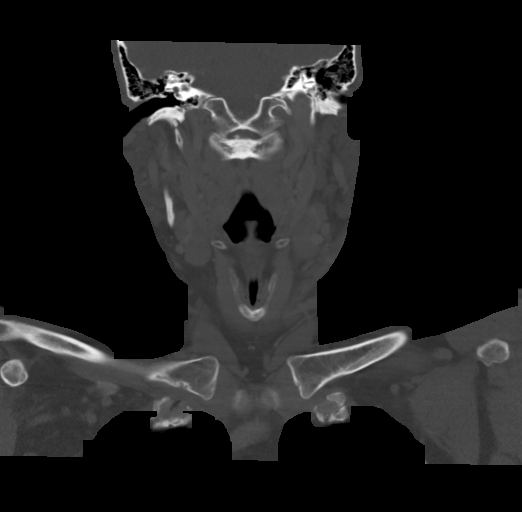
[im 83/139  bone]
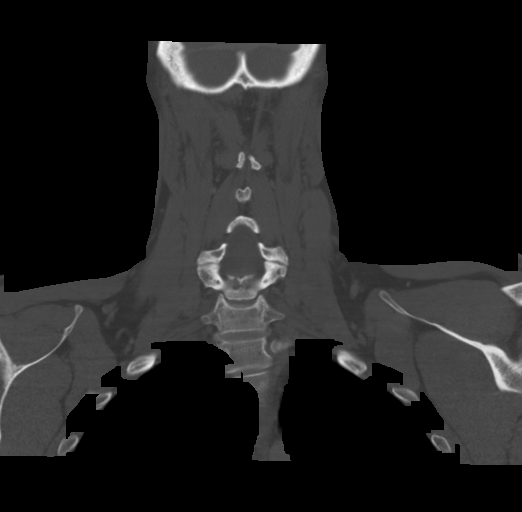

[Series 7: orthogonal (person_name) · axial · 0.60mm/px · z∈[+236,+360]mm · 3 of 125 slices shown]
[im 32/125  bone]
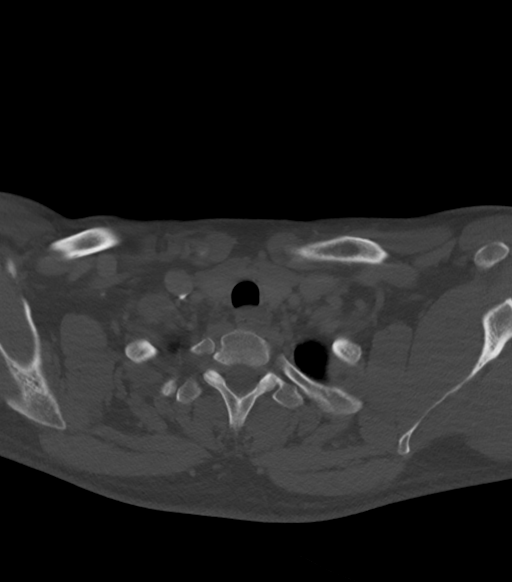
[im 63/125  bone]
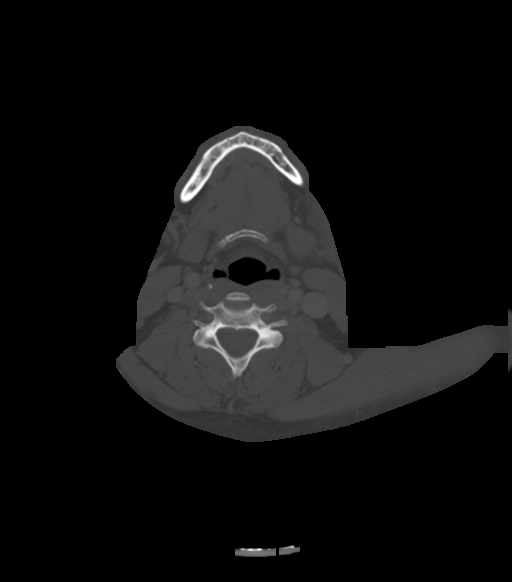
[im 94/125  bone]
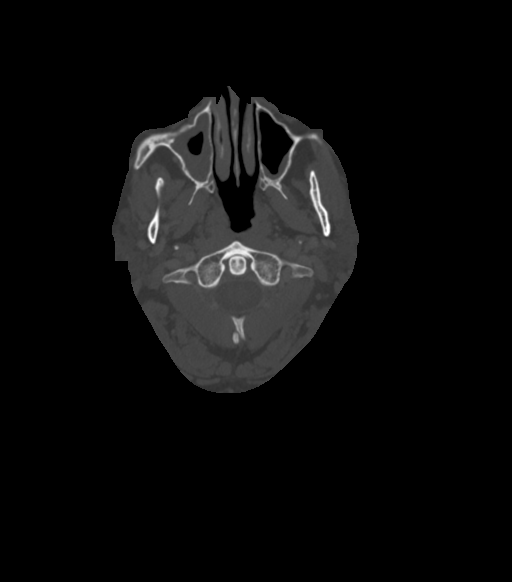

[16 of 33 positions shown; findings below may reference images not displayed]

RADIATION DOSE REDUCTION: This exam was performed according to the
departmental dose-optimization program which includes automated
exposure control, adjustment of the mA and/or kV according to
patient size and/or use of iterative reconstruction technique.

CONTRAST:  75mL OMNIPAQUE IOHEXOL 300 MG/ML  SOLN
FINDINGS: Pharynx and larynx: Normal. No mass or swelling.

Salivary glands: No inflammation, mass, or stone.

Thyroid: Negative

Lymph nodes: No pathologically enlarged lymph nodes. Small
submandibular lymph nodes bilaterally. Subcentimeter level 2 lymph
nodes bilaterally.

Vascular: Normal vascular enhancement.

Limited intracranial: Negative

Visualized orbits: Negative

Mastoids and visualized paranasal sinuses: Mucosal edema paranasal
sinuses. This is most prominent in the right maxillary sinus.

Skeleton: Mandible is edentulous. Multiple caries in the maxilla
with multiple areas of periapical lucency compatible with dental
infection. These appear remote from the left dermal infection.

Upper chest: Lung apices clear bilaterally.

Other: Skin thickening below the mandible on the left. There is
associated infiltration of the subcutaneous fat and thickening of
the platysmas muscle. Within the subcutaneous fat, there is a 8 mm
subcutaneous fluid collection likely due to abscess. This extends to
the skin surface.
IMPRESSION: 8 mm dermal abscess left chin below the mandible with associated
cellulitis.

Poor dentition. Multiple caries and periapical lucencies around
upper teeth.

Sinus mucosal thickening.

## 2021-10-21 MED ORDER — KETOROLAC TROMETHAMINE 30 MG/ML IJ SOLN
30.0000 mg | Freq: Once | INTRAMUSCULAR | Status: AC
  Administered 2021-10-21: 30 mg via INTRAVENOUS
  Filled 2021-10-21: qty 1

## 2021-10-21 MED ORDER — CLINDAMYCIN HCL 150 MG PO CAPS: 300.0000 mg | ORAL_CAPSULE | Freq: Three times a day (TID) | ORAL | 0 refills | Status: DC

## 2021-10-21 MED ORDER — CLINDAMYCIN PHOSPHATE 600 MG/50ML IV SOLN
600.0000 mg | Freq: Once | INTRAVENOUS | Status: AC
  Administered 2021-10-21: 600 mg via INTRAVENOUS
  Filled 2021-10-21: qty 50

## 2021-10-21 MED ORDER — IOHEXOL 300 MG/ML  SOLN
75.0000 mL | Freq: Once | INTRAMUSCULAR | Status: AC | PRN
  Administered 2021-10-21: 75 mL via INTRAVENOUS
  Filled 2021-10-21: qty 75

## 2021-10-21 NOTE — ED Triage Notes (Signed)
Woke up this morning with redness and swelling to right side under jaw.  Speech clear. NAD

## 2021-10-21 NOTE — ED Provider Notes (Signed)
[facility] Provider Note    Event Date/Time   First MD Initiated Contact with Patient 10/21/21 1324     (approximate)   History   Abscess   HPI  Eric Peck is a 46 y.o. male presents emergency department complaining of an abscess on the left side of his neck.  Patient states he had a small raised area yesterday and woke up this morning with very large swollen area.  Difficulty to swallow, feels like it is pressing inwards towards his airway.  Has had a fever all week.  Took a COVID test that was negative.  No chest pain or shortness of breath at this time      Physical Exam   Triage Vital Signs: ED Triage Vitals  Enc Vitals Group     BP 10/21/21 1158 138/80     Pulse Rate 10/21/21 1158 77     Resp 10/21/21 1158 18     Temp 10/21/21 1158 98.5 F (36.9 C)     Temp Source 10/21/21 1158 Oral     SpO2 10/21/21 1158 97 %     Weight 10/21/21 1143 229 lb 15 oz (104.3 kg)     Height 10/21/21 1143 6' (1.829 m)     Head Circumference --      Peak Flow --      Pain Score 10/21/21 1143 8     Pain Loc --      Pain Edu? --      Excl. in [place]? --     Most recent vital signs: Vitals:   10/21/21 1158 10/21/21 1401  BP: 138/80 125/87  Pulse: 77 74  Resp: 18 16  Temp: 98.5 F (36.9 C)   SpO2: 97% 97%     General: Awake, no distress.   CV:  Good peripheral perfusion. regular rate and  rhythm Resp:  Normal effort. Lungs CTA Abd:  No distention.   Other:  Skin with redness and swelling, area size of a golf ball on the left side of the neck, no drainage noted, no swelling noted underneath the tongue   ED Results / Procedures / Treatments   Labs (all labs ordered are listed, but only abnormal results are displayed) Labs Reviewed  COMPREHENSIVE METABOLIC PANEL - Abnormal; Notable for the following components:      Result Value   Total Bilirubin 1.8 (*)    All other components within normal limits  CBC WITH DIFFERENTIAL/PLATELET - Abnormal;  Notable for the following components:   WBC 11.5 (*)    Neutro Abs 8.3 (*)    All other components within normal limits  RESP PANEL BY RT-PCR (FLU A&B, COVID) [id number]     EKG     RADIOLOGY CT soft tissue of the neck    PROCEDURES:   Procedures   MEDICATIONS ORDERED IN ED: Medications  clindamycin (CLEOCIN) IVPB 600 mg (has no administration in time range)  ketorolac (TORADOL) 30 MG/ML injection 30 mg (has no administration in time range)  iohexol (OMNIPAQUE) 300 MG/ML solution 75 mL (75 mLs Intravenous Contrast Given 10/21/21 1444)     IMPRESSION / MDM / ASSESSMENT AND PLAN / ED COURSE  I reviewed the triage vital signs and the nursing notes.                              Differential diagnosis includes, but is not limited to, abscess, mass, dental abscess, cellulitis, Ludwick's  Patient's labs are reassuring, CBC has elevated WBC of 11.5, comprehensive metabolic panel is normal, respiratory panel is negative  CT soft tissue of the neck shows some dental caries along with dermal abscess, this was reviewed by me.  Read by radiology  I did explain the findings to the patient.  He was given clindamycin 600 mg IV while here in the ED.  We will go with clindamycin p.o. as outpatient.  Apply warm compress.  Return emergency department worsening.  Follow-up with his regular doctor if not improving in 3 to 4 days.  Patient is in agreement treatment plan.  He was given Toradol for his headache.  Discharged in stable condition peer       FINAL CLINICAL IMPRESSION(S) / ED DIAGNOSES   Final diagnoses:  Abscess     Rx / DC Orders   ED Discharge Orders          Ordered    clindamycin (CLEOCIN) 150 MG capsule  3 times daily        10/21/21 1526             Note:  This document was prepared using Dragon voice recognition software and may include unintentional dictation errors.    Versie Starks, PA-C 10/21/21 1547    Vladimir Crofts, MD 10/21/21 [id number]

## 2021-10-21 NOTE — Discharge Instructions (Signed)
Follow up with your regular doctor if not improving in 3 days, return to the ER if worsening Take the antibiotic as prescribed, apply warm compress to the area

## 2021-12-01 ENCOUNTER — Emergency Department (HOSPITAL_BASED_OUTPATIENT_CLINIC_OR_DEPARTMENT_OTHER): Payer: Commercial Managed Care - HMO

## 2021-12-01 ENCOUNTER — Other Ambulatory Visit: Payer: Self-pay

## 2021-12-01 ENCOUNTER — Encounter (HOSPITAL_BASED_OUTPATIENT_CLINIC_OR_DEPARTMENT_OTHER): Payer: Self-pay | Admitting: *Deleted

## 2021-12-01 ENCOUNTER — Emergency Department (HOSPITAL_BASED_OUTPATIENT_CLINIC_OR_DEPARTMENT_OTHER)
Admission: EM | Admit: 2021-12-01 | Discharge: 2021-12-01 | Disposition: A | Discharge status: Home / Self Care | Payer: Commercial Managed Care - HMO | Attending: Emergency Medicine | Admitting: Emergency Medicine

## 2021-12-01 DIAGNOSIS — K5732 Diverticulitis of large intestine without perforation or abscess without bleeding: Secondary | ICD-10-CM | POA: Insufficient documentation

## 2021-12-01 DIAGNOSIS — R1031 Right lower quadrant pain: Secondary | ICD-10-CM | POA: Diagnosis present

## 2021-12-01 DIAGNOSIS — I10 Essential (primary) hypertension: Secondary | ICD-10-CM | POA: Insufficient documentation

## 2021-12-01 DIAGNOSIS — K5792 Diverticulitis of intestine, part unspecified, without perforation or abscess without bleeding: Secondary | ICD-10-CM

## 2021-12-01 LAB — COMPREHENSIVE METABOLIC PANEL
ALT: 15 U/L (ref 0–44)
AST: 21 U/L (ref 15–41)
Albumin: 3.9 g/dL (ref 3.5–5.0)
Alkaline Phosphatase: 46 U/L (ref 38–126)
Anion gap: 9 (ref 5–15)
BUN: 15 mg/dL (ref 6–20)
CO2: 23 mmol/L (ref 22–32)
Calcium: 8.7 mg/dL — ABNORMAL LOW (ref 8.9–10.3)
Chloride: 109 mmol/L (ref 98–111)
Creatinine, Ser: 1.07 mg/dL (ref 0.61–1.24)
GFR, Estimated: >60 mL/min (ref >60)
Glucose, Bld: 94 mg/dL (ref 70–99)
Potassium: 3.7 mmol/L (ref 3.5–5.1)
Sodium: 141 mmol/L (ref 135–145)
Total Bilirubin: 0.5 mg/dL (ref 0.3–1.2)
Total Protein: 7 g/dL (ref 6.5–8.1)

## 2021-12-01 LAB — URINALYSIS, ROUTINE W REFLEX MICROSCOPIC
Bilirubin Urine: NEGATIVE
Glucose, UA: NEGATIVE mg/dL
Hgb urine dipstick: NEGATIVE
Ketones, ur: NEGATIVE mg/dL
Leukocytes,Ua: NEGATIVE
Nitrite: NEGATIVE
Protein, ur: NEGATIVE mg/dL
Specific Gravity, Urine: 1.025 (ref 1.005–1.030)
pH: 7 (ref 5.0–8.0)

## 2021-12-01 LAB — CBC
HCT: 43.2 % (ref 39.0–52.0)
Hemoglobin: 15 g/dL (ref 13.0–17.0)
MCH: 30 pg (ref 26.0–34.0)
MCHC: 34.7 g/dL (ref 30.0–36.0)
MCV: 86.4 fL (ref 80.0–100.0)
Platelets: 219 10*3/uL (ref 150–400)
RBC: 5 MIL/uL (ref 4.22–5.81)
RDW: 13.3 % (ref 11.5–15.5)
WBC: 7.9 10*3/uL (ref 4.0–10.5)
nRBC: 0 % (ref 0.0–0.2)

## 2021-12-01 LAB — LIPASE, BLOOD: Lipase: 39 U/L (ref 11–51)

## 2021-12-01 IMAGING — CT CT ABD-PELV W/ CM
2 of 5 series · 15 of 46 positions shown, 17 images · IV contrast (agent unspecified)
Comparison: [DATE]

CLINICAL DATA: Left lower quadrant abdominal pain.

EXAM:
CT ABDOMEN AND PELVIS WITH CONTRAST
TECHNIQUE: Multidetector CT imaging of the abdomen and pelvis was performed
using the standard protocol following bolus administration of
intravenous contrast.

[Series 2: axial st · axial · 0.95mm/px · z∈[-720,-205]mm · 12 of 115 slices shown, 14 images]
[im 6/115  soft-tissue]
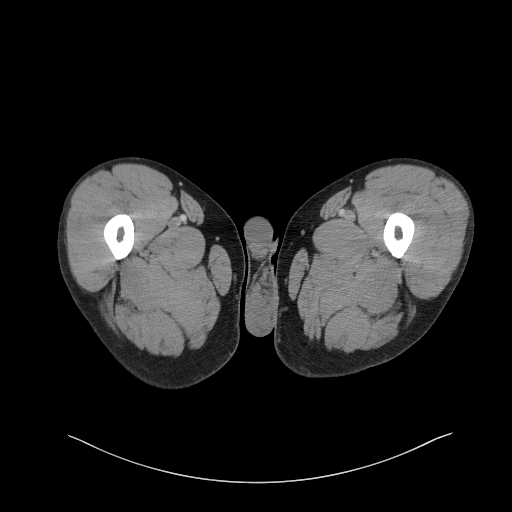
[im 6/115  bone]
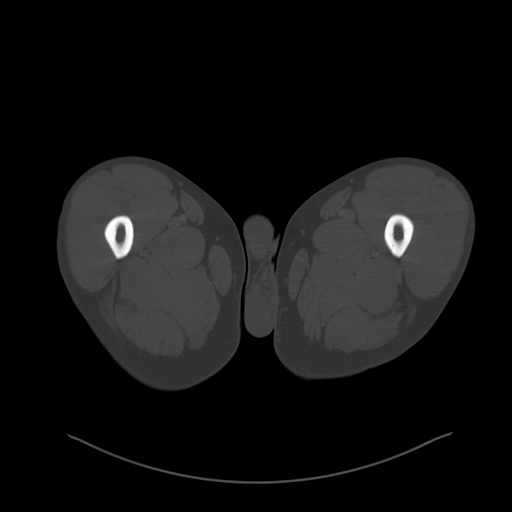
[im 18/115  soft-tissue]
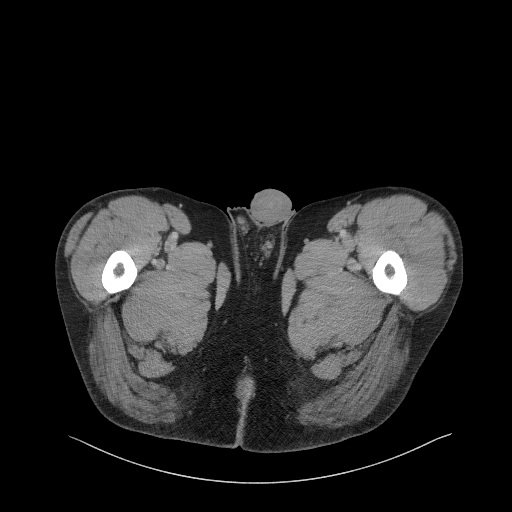
[im 23/115  soft-tissue]
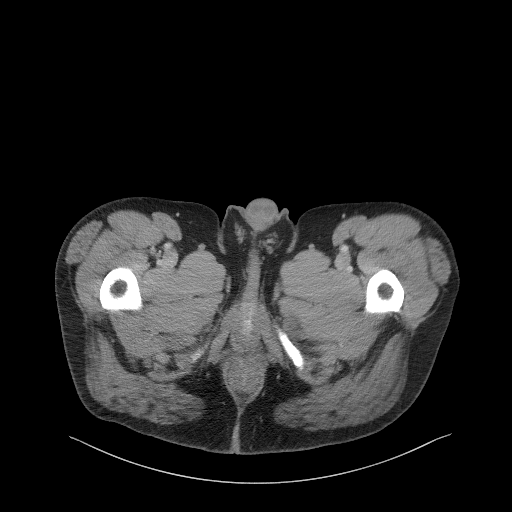
[im 35/115  soft-tissue]
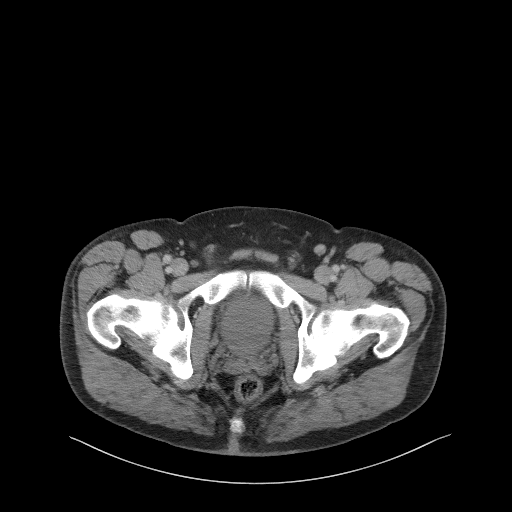
[im 46/115  soft-tissue]
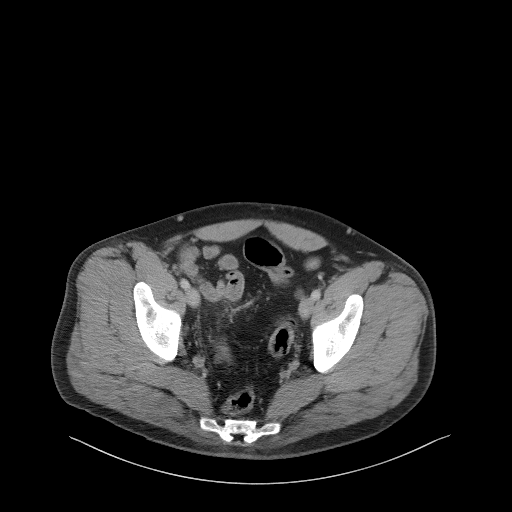
[im 52/115  soft-tissue]
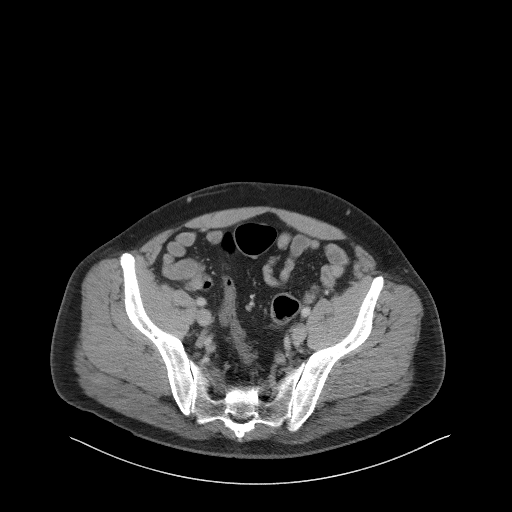
[im 63/115  soft-tissue]
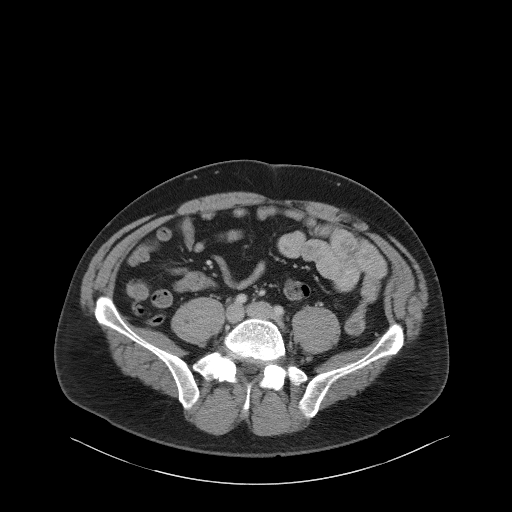
[im 69/115  soft-tissue]
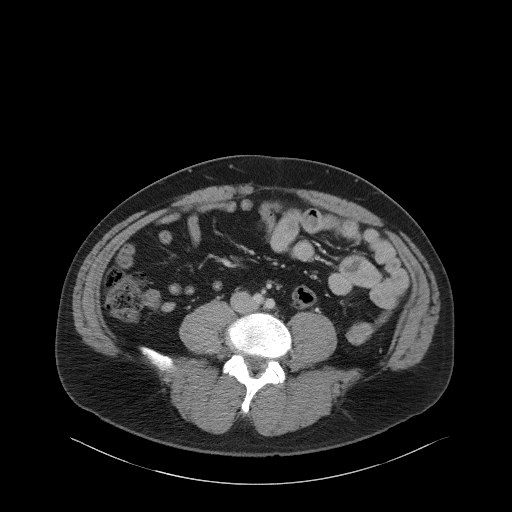
[im 80/115  soft-tissue]
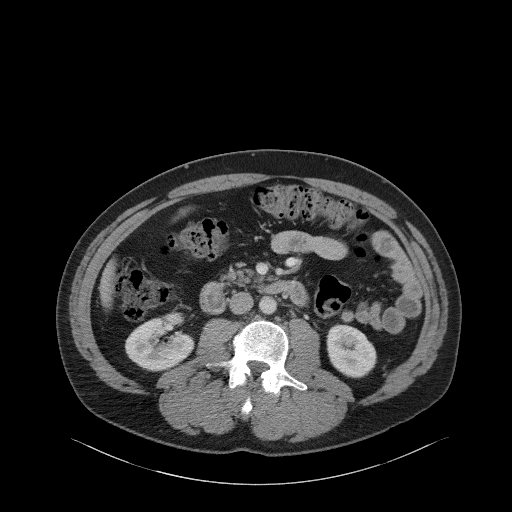
[im 80/115  bone]
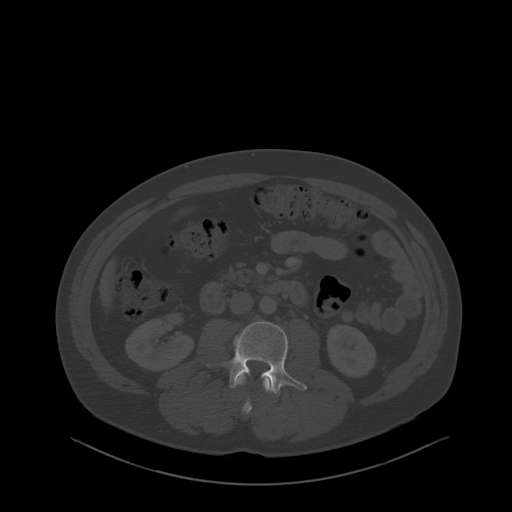
[im 92/115  soft-tissue]
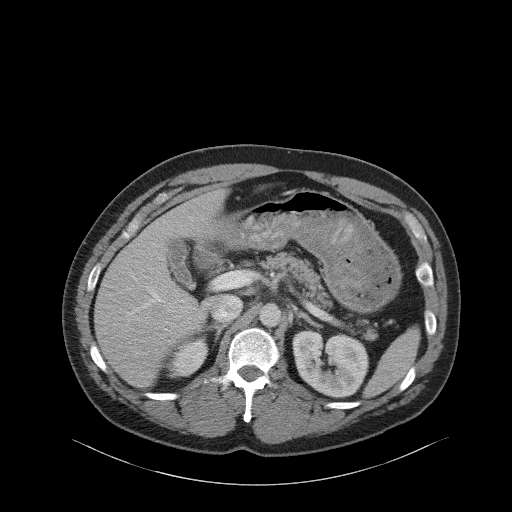
[im 97/115  soft-tissue]
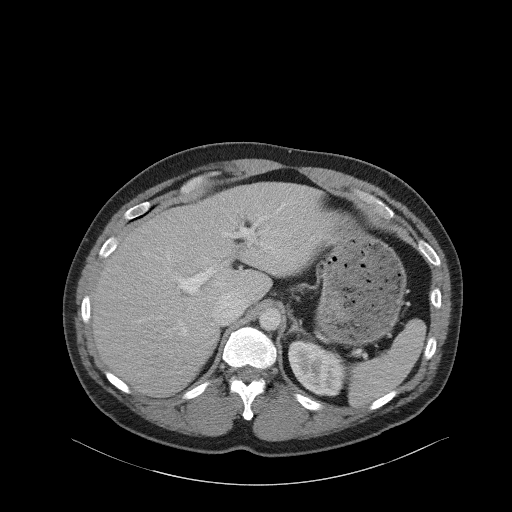
[im 109/115  soft-tissue]
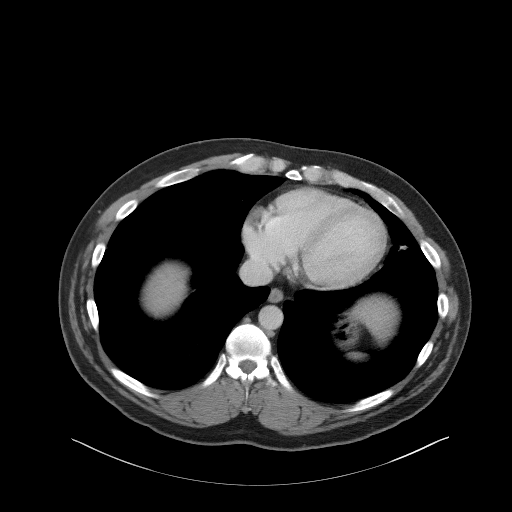

[Series 5: coronal st · coronal · 0.87mm/px · 3 of 101 slices shown]
[im 34/101  soft-tissue]
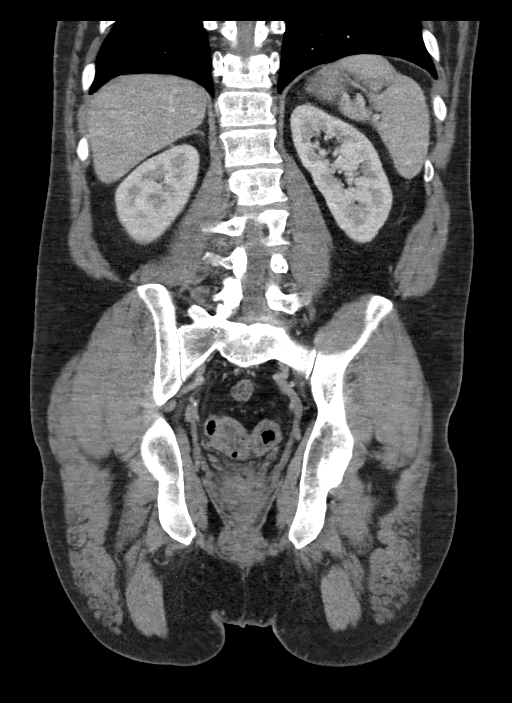
[im 45/101  soft-tissue]
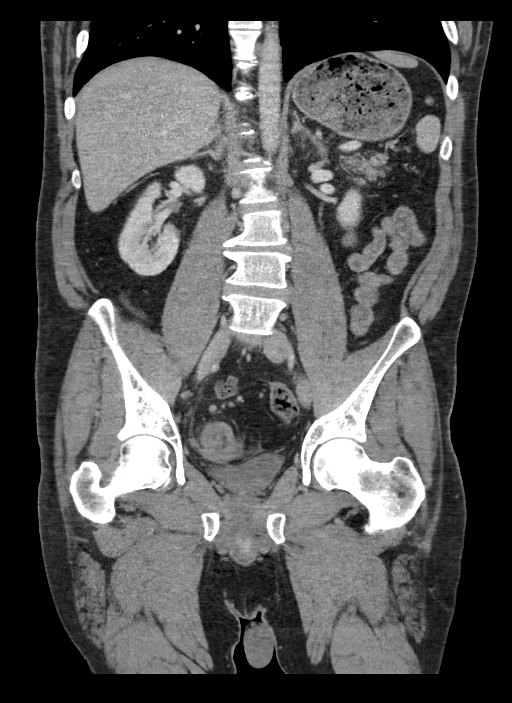
[im 56/101  soft-tissue]
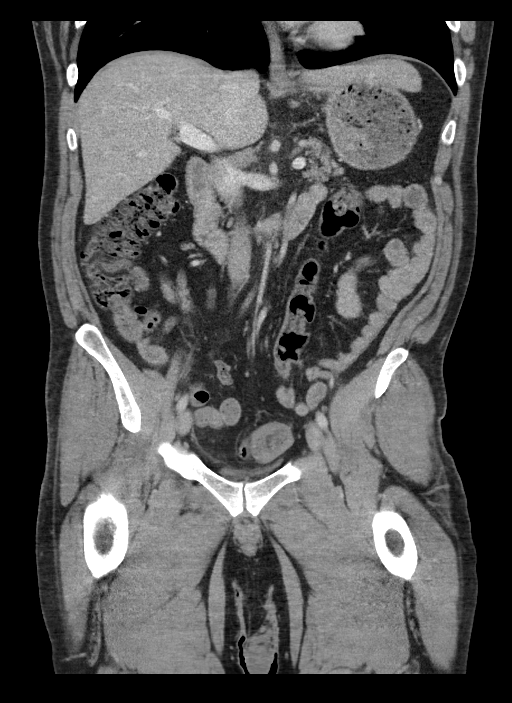

[15 of 46 positions shown; findings below may reference images not displayed]

RADIATION DOSE REDUCTION: This exam was performed according to the
departmental dose-optimization program which includes automated
exposure control, adjustment of the mA and/or kV according to
patient size and/or use of iterative reconstruction technique.

CONTRAST:  100mL OMNIPAQUE IOHEXOL 300 MG/ML  SOLN
FINDINGS: Lower chest: No acute abnormality.

Hepatobiliary: No focal liver abnormality is seen. No gallstones,
gallbladder wall thickening, or biliary dilatation.

Pancreas: Unremarkable. No pancreatic ductal dilatation or
surrounding inflammatory changes.

Spleen: Normal in size without focal abnormality.

Adrenals/Urinary Tract: Normal adrenal glands. No kidney mass or
hydronephrosis identified. No nephrolithiasis. Bladder is
unremarkable.

Stomach/Bowel: Stomach appears within normal limits. The appendix is
visualized and appears normal. No small bowel wall thickening,
inflammation, or distension. Sigmoid diverticulosis. There is a
short segment of distal sigmoid colon with wall thickening and mild
pericolonic inflammation concerning for acute diverticulitis, image
74/2. No signs of bowel perforation. No pneumoperitoneum or abscess.
No significant free fluid.

Vascular/Lymphatic: No significant vascular findings are present. No
enlarged abdominal or pelvic lymph nodes.

Reproductive: Prostate is unremarkable.

Other: No free fluid or fluid collections.

Musculoskeletal: No acute or significant osseous findings.
IMPRESSION: Examination is positive for acute diverticulitis involving the
distal sigmoid colon. No signs of bowel perforation or abscess.

## 2021-12-01 MED ORDER — ONDANSETRON 4 MG PO TBDP: 4.0000 mg | ORAL_TABLET | Freq: Three times a day (TID) | ORAL | 0 refills | Status: AC | PRN

## 2021-12-01 MED ORDER — OXYCODONE-ACETAMINOPHEN 5-325 MG PO TABS
1.0000 | ORAL_TABLET | Freq: Three times a day (TID) | ORAL | 0 refills | Status: DC | PRN
Start: 2021-12-01 — End: 2021-12-01

## 2021-12-01 MED ORDER — AMOXICILLIN-POT CLAVULANATE 875-125 MG PO TABS
1.0000 | ORAL_TABLET | Freq: Once | ORAL | Status: AC
  Administered 2021-12-01: 1 via ORAL
  Filled 2021-12-01: qty 1

## 2021-12-01 MED ORDER — ONDANSETRON HCL 4 MG/2ML IJ SOLN
4.0000 mg | Freq: Once | INTRAMUSCULAR | Status: AC
  Administered 2021-12-01: 4 mg via INTRAVENOUS
  Filled 2021-12-01: qty 2

## 2021-12-01 MED ORDER — FENTANYL CITRATE PF 50 MCG/ML IJ SOSY
50.0000 ug | PREFILLED_SYRINGE | Freq: Once | INTRAMUSCULAR | Status: AC
  Administered 2021-12-01: 50 ug via INTRAVENOUS
  Filled 2021-12-01: qty 1

## 2021-12-01 MED ORDER — OXYCODONE-ACETAMINOPHEN 5-325 MG PO TABS: 1.0000 | ORAL_TABLET | Freq: Three times a day (TID) | ORAL | 0 refills | Status: DC | PRN

## 2021-12-01 MED ORDER — HYDROMORPHONE HCL 1 MG/ML IJ SOLN
1.0000 mg | Freq: Once | INTRAMUSCULAR | Status: AC
  Administered 2021-12-01: 1 mg via INTRAVENOUS
  Filled 2021-12-01: qty 1

## 2021-12-01 MED ORDER — SODIUM CHLORIDE 0.9 % IV BOLUS
1000.0000 mL | Freq: Once | INTRAVENOUS | Status: AC
  Administered 2021-12-01: 1000 mL via INTRAVENOUS

## 2021-12-01 MED ORDER — AMOXICILLIN-POT CLAVULANATE 875-125 MG PO TABS: 1.0000 | ORAL_TABLET | Freq: Two times a day (BID) | ORAL | 0 refills | Status: AC

## 2021-12-01 MED ORDER — IOHEXOL 300 MG/ML  SOLN
100.0000 mL | Freq: Once | INTRAMUSCULAR | Status: AC | PRN
  Administered 2021-12-01: 100 mL via INTRAVENOUS

## 2021-12-01 NOTE — Discharge Instructions (Addendum)
Your CT scan shows diverticulitis without evidence of perforation or abscess. ?We will treat this with antibiotics. ?I have also given medications to help with your pain and nausea. ?Return to the ER immediately if you start to have worsening pain, bloody stools or vomit, fever, chest pain or shortness of breath. ?

## 2021-12-01 NOTE — ED Triage Notes (Signed)
Lower abdominal pain since yesterday. Swelling in his right groin per pt.  ?

## 2021-12-01 NOTE — ED Provider Notes (Signed)
[facility] Provider Note   CSN: [id number] Arrival date & time: 12/01/21  1525     History  Chief Complaint  Patient presents with   Abdominal Pain    Eric Peck is a 46 y.o. male with a past medical history of diverticulitis with perforation x2 episodes, hypertension, GERD presenting to the ED with a chief complaint of lower abdominal pain.  Reports sharp shooting pain in his lower abdomen that is worse on the right side.  He states that this does feel similar to his prior episodes of diverticulitis.  1 dose of Tylenol earlier today without much improvement.  He reports no changes to bowel movements or urination but does report several episodes of nonbloody, nonbilious emesis.  Denies any chest pain, shortness of breath or documented fever.  He denies any testicle pain or swelling   Abdominal Pain Associated symptoms: vomiting   Associated symptoms: no chest pain, no chills, no constipation, no cough, no diarrhea, no dysuria, no fever, no hematuria, no nausea, no shortness of breath and no sore throat       Home Medications Prior to Admission medications   Medication Sig Start Date End Date Taking? Authorizing Provider  amoxicillin-clavulanate (AUGMENTIN) 875-125 MG tablet Take 1 tablet by mouth every 12 (twelve) hours for 10 days. 12/01/21 12/11/21 Yes Logan Baltimore, PA-C  ondansetron (ZOFRAN-ODT) 4 MG disintegrating tablet Take 1 tablet (4 mg total) by mouth every 8 (eight) hours as needed for nausea or vomiting. 12/01/21  Yes Michale Emmerich, PA-C  oxyCODONE-acetaminophen (PERCOCET/ROXICET) 5-325 MG tablet Take 1 tablet by mouth every 8 (eight) hours as needed for severe pain. 12/01/21  Yes Jahaan Vanwagner, PA-C  acetaminophen (TYLENOL) 500 MG tablet Take 1 tablet (500 mg total) by mouth every 6 (six) hours as needed. 08/02/21   Varney Biles, MD  acetaminophen (TYLENOL) 500 MG tablet Take 1 tablet (500 mg total) by mouth every 6 (six) hours as needed for  moderate pain. 08/02/21   Varney Biles, MD  clindamycin (CLEOCIN) 150 MG capsule Take 2 capsules (300 mg total) by mouth 3 (three) times daily. 10/21/21   Fisher, Linden Dolin, PA-C  ibuprofen (ADVIL) 600 MG tablet Take 1 tablet (600 mg total) by mouth every 6 (six) hours as needed. 08/02/21   Varney Biles, MD  methocarbamol (ROBAXIN) 500 MG tablet Take 1 tablet (500 mg total) by mouth 2 (two) times daily. 08/02/21   Varney Biles, MD  predniSONE (DELTASONE) 10 MG tablet Take 5 tablets (50 mg total) by mouth daily. 08/02/21   Varney Biles, MD      Allergies    Patient has no known allergies.    Review of Systems   Review of Systems  Constitutional:  Negative for appetite change, chills and fever.  HENT:  Negative for ear pain, rhinorrhea, sneezing and sore throat.   Eyes:  Negative for photophobia and visual disturbance.  Respiratory:  Negative for cough, chest tightness, shortness of breath and wheezing.   Cardiovascular:  Negative for chest pain and palpitations.  Gastrointestinal:  Positive for abdominal pain and vomiting. Negative for blood in stool, constipation, diarrhea and nausea.  Genitourinary:  Negative for dysuria, hematuria and urgency.  Musculoskeletal:  Negative for myalgias.  Skin:  Negative for rash.  Neurological:  Negative for dizziness, weakness and light-headedness.   Physical Exam Updated Vital Signs BP 115/63    Pulse 61    Temp 98.3 F (36.8 C) (Oral)    Resp 16  Ht 6' (1.829 m)    Wt 104.3 kg    SpO2 98%    BMI 31.19 kg/m  Physical Exam Vitals and nursing note reviewed.  Constitutional:      General: He is not in acute distress.    Appearance: He is well-developed.  HENT:     Head: Normocephalic and atraumatic.     Nose: Nose normal.  Eyes:     General: No scleral icterus.       Left eye: No discharge.     Conjunctiva/sclera: Conjunctivae normal.  Cardiovascular:     Rate and Rhythm: Normal rate and regular rhythm.     Heart sounds: Normal heart  sounds. No murmur heard.   No friction rub. No gallop.  Pulmonary:     Effort: Pulmonary effort is normal. No respiratory distress.     Breath sounds: Normal breath sounds.  Abdominal:     General: Bowel sounds are normal. There is no distension.     Palpations: Abdomen is soft.     Tenderness: There is abdominal tenderness in the right upper quadrant, right lower quadrant and suprapubic area. There is no guarding.  Musculoskeletal:        General: Normal range of motion.     Cervical back: Normal range of motion and neck supple.  Skin:    General: Skin is warm and dry.     Findings: No rash.  Neurological:     Mental Status: He is alert.     Motor: No abnormal muscle tone.     Coordination: Coordination normal.    ED Results / Procedures / Treatments   Labs (all labs ordered are listed, but only abnormal results are displayed) Labs Reviewed  COMPREHENSIVE METABOLIC PANEL - Abnormal; Notable for the following components:      Result Value   Calcium 8.7 (*)    All other components within normal limits  URINALYSIS, ROUTINE W REFLEX MICROSCOPIC - Abnormal; Notable for the following components:   Color, Urine AMBER (*)    All other components within normal limits  LIPASE, BLOOD  CBC    EKG None  Radiology CT ABDOMEN PELVIS W CONTRAST  Result Date: 12/01/2021 CLINICAL DATA:  Left lower quadrant abdominal pain. EXAM: CT ABDOMEN AND PELVIS WITH CONTRAST TECHNIQUE: Multidetector CT imaging of the abdomen and pelvis was performed using the standard protocol following bolus administration of intravenous contrast. RADIATION DOSE REDUCTION: This exam was performed according to the departmental dose-optimization program which includes automated exposure control, adjustment of the mA and/or kV according to patient size and/or use of iterative reconstruction technique. CONTRAST:  125mL OMNIPAQUE IOHEXOL 300 MG/ML  SOLN COMPARISON:  09/03/2019 FINDINGS: Lower chest: No acute abnormality.  Hepatobiliary: No focal liver abnormality is seen. No gallstones, gallbladder wall thickening, or biliary dilatation. Pancreas: Unremarkable. No pancreatic ductal dilatation or surrounding inflammatory changes. Spleen: Normal in size without focal abnormality. Adrenals/Urinary Tract: Normal adrenal glands. No kidney mass or hydronephrosis identified. No nephrolithiasis. Bladder is unremarkable. Stomach/Bowel: Stomach appears within normal limits. The appendix is visualized and appears normal. No small bowel wall thickening, inflammation, or distension. Sigmoid diverticulosis. There is a short segment of distal sigmoid colon with wall thickening and mild pericolonic inflammation concerning for acute diverticulitis, image 74/2. No signs of bowel perforation. No pneumoperitoneum or abscess. No significant free fluid. Vascular/Lymphatic: No significant vascular findings are present. No enlarged abdominal or pelvic lymph nodes. Reproductive: Prostate is unremarkable. Other: No free fluid or fluid collections. Musculoskeletal: No acute or  significant osseous findings. IMPRESSION: Examination is positive for acute diverticulitis involving the distal sigmoid colon. No signs of bowel perforation or abscess. Electronically Signed   By: Kerby Moors M.D.   On: 12/01/2021 16:48    Procedures Procedures    Medications Ordered in ED Medications  sodium chloride 0.9 % bolus 1,000 mL (0 mLs Intravenous Stopped 12/01/21 1728)  fentaNYL (SUBLIMAZE) injection 50 mcg (50 mcg Intravenous Given 12/01/21 1558)  ondansetron (ZOFRAN) injection 4 mg (4 mg Intravenous Given 12/01/21 1557)  iohexol (OMNIPAQUE) 300 MG/ML solution 100 mL (100 mLs Intravenous Contrast Given 12/01/21 1624)  amoxicillin-clavulanate (AUGMENTIN) 875-125 MG per tablet 1 tablet (1 tablet Oral Given 12/01/21 1720)  HYDROmorphone (DILAUDID) injection 1 mg (1 mg Intravenous Given 12/01/21 1720)    ED Course/ Medical Decision Making/ A&P Clinical Course as of  12/01/21 1818  Thu Dec 01, 2021  1554 WBC: 7.9 [HK]    Clinical Course User Index [HK] Delia Heady, PA-C                           Medical Decision Making Amount and/or Complexity of Data Reviewed Labs: ordered. Decision-making details documented in ED Course. Radiology: ordered.  Risk Prescription drug management.   46 year old male presenting to the ED with lower abdominal pain.  Pain is sharp, located in the right side of the abdomen.  He has had 2 episodes of diverticulitis with perforation in the past and states that this feels similar.  Reports associated vomiting.  No change to bowel movements or urination.  On exam there is tenderness in the lower abdomen on the right side.  He does appear uncomfortable.  Vital signs within normal limits.  Will obtain lab work, imaging and attempt to control symptoms  Labs ordered and interpreted by me.  CBC, CMP, lipase unremarkable.  Urinalysis is unremarkable.  CT of the abdomen pelvis obtained which showed evidence of diverticulitis without perforation or abscess formation.  Patient reports improvement in nausea but pain returned after CT scan due to positioning.  Will give a dose of antibiotics here, additional pain medicine and p.o. challenge.  On recheck patient's pain has significantly improved with IV medications.  He is tolerating p.o. intake without difficulty.  He was given first dose of Augmentin here.  I had discussion with the patient regarding trial of p.o. antibiotics and management at home and he agrees.  Will discharge with remainder of antibiotic course, short course of pain medication antiemetic as well.  He knows to return for any worsening symptoms.  He remains hemodynamically stable   Patient is hemodynamically stable, in NAD, and able to ambulate in the ED. Evaluation does not show pathology that would require ongoing emergent intervention or inpatient treatment. I explained the diagnosis to the patient. Pain has been managed  and has no complaints prior to discharge. Patient is comfortable with above plan and is stable for discharge at this time. All questions were answered prior to disposition. Strict return precautions for returning to the ED were discussed. Encouraged follow up with PCP.   An After Visit Summary was printed and given to the patient.  Prior to providing a prescription for a controlled substance, I independently reviewed the patient's recent prescription history on the [place]. The patient had no recent or regular prescriptions and was deemed appropriate for a brief, less than 3 day prescription of narcotic for acute analgesia.   Portions of this note  were generated with [employer]. Dictation errors may occur despite best attempts at proofreading.        Final Clinical Impression(s) / ED Diagnoses Final diagnoses:  Diverticulitis    Rx / DC Orders ED Discharge Orders          Ordered    amoxicillin-clavulanate (AUGMENTIN) 875-125 MG tablet  Every 12 hours        12/01/21 1817    oxyCODONE-acetaminophen (PERCOCET/ROXICET) 5-325 MG tablet  Every 8 hours PRN        12/01/21 1817    ondansetron (ZOFRAN-ODT) 4 MG disintegrating tablet  Every 8 hours PRN        12/01/21 1817              Delia Heady, PA-C 12/01/21 1818    Long, [facility], MD 12/09/21 [phone]

## 2022-05-18 ENCOUNTER — Ambulatory Visit: Payer: Commercial Managed Care - HMO | Admitting: Internal Medicine

## 2022-05-18 ENCOUNTER — Encounter: Payer: Self-pay | Admitting: Internal Medicine

## 2022-05-18 VITALS — BP 147/99 | HR 65 | Temp 98.0°F | Resp 16 | Ht 72.0 in | Wt 225.0 lb

## 2022-05-18 DIAGNOSIS — Z720 Tobacco use: Secondary | ICD-10-CM

## 2022-05-18 DIAGNOSIS — Z8249 Family history of ischemic heart disease and other diseases of the circulatory system: Secondary | ICD-10-CM

## 2022-05-18 DIAGNOSIS — R9431 Abnormal electrocardiogram [ECG] [EKG]: Secondary | ICD-10-CM | POA: Insufficient documentation

## 2022-05-18 DIAGNOSIS — I10 Essential (primary) hypertension: Secondary | ICD-10-CM

## 2022-05-18 DIAGNOSIS — R0609 Other forms of dyspnea: Secondary | ICD-10-CM

## 2022-05-18 DIAGNOSIS — R0602 Shortness of breath: Secondary | ICD-10-CM

## 2022-05-18 MED ORDER — LOSARTAN POTASSIUM 50 MG PO TABS: 50.0000 mg | ORAL_TABLET | Freq: Every day | ORAL | 2 refills | Status: AC

## 2022-05-18 MED ORDER — ROSUVASTATIN CALCIUM 40 MG PO TABS: 40.0000 mg | ORAL_TABLET | Freq: Every day | ORAL | 2 refills | Status: AC

## 2022-05-18 NOTE — Progress Notes (Signed)
Primary Physician/Referring:  Trey Sailors, PA  Patient ID: Eric Peck, male    DOB: Mar 25, 1976, 46 y.o.   MRN: [id number]  Chief Complaint  Patient presents with   Shortness of Breath   Abnormal ECG   New Patient (Initial Visit)   HPI:    Eric Peck  is a 46 y.o. male with past medical history significant for HTN, tobacco dependence, and early heart disease in his father who is here to establish care with cardiology. Patient has been having chest pain and dyspnea on exertion which is not normal for him. He is usually active and not limited by anything. He admits to chest pressure and tightness as well. He is concerned because his dad had a massive MI when he was 77yo and the patient is now around the same age. At rest he denies chest pain, shortness of breath, palpitations, diaphoresis, syncope, edema, PND, orthopnea.   Past Medical History:  Diagnosis Date   Anxiety    Arthritis    CTS (carpal tunnel syndrome)    Diverticulitis of large intestine with perforation 03/2015   microperf of sigmoid colitis, tx with abx, no surg   GERD (gastroesophageal reflux disease)    Hypertension 11/06/2016   Past Surgical History:  Procedure Laterality Date   COLONOSCOPY N/A 04/23/2015   Procedure: DIAGNOSTIC COLONOSCOPY;  Surgeon: [name], MD;  Location: [facility];  Service: Endoscopy;  Laterality: N/A;   VASECTOMY     Family History  Problem Relation Age of Onset   COPD Mother    Inflammatory bowel disease Mother    Cystic fibrosis Mother    Irritable bowel syndrome Mother    Alcohol abuse Mother    Diabetes Father    Kidney disease Father    Eczema Brother    Irritable bowel syndrome Brother    Mental retardation Brother        Down's Syndrome   Breast cancer Maternal Grandmother    Colitis Maternal Grandmother    Breast cancer Paternal Grandmother    Prostate cancer Paternal Uncle        x 2   Crohn's disease Cousin        x 2   Esophageal cancer  Paternal Grandfather    Colon cancer Neg Hx    Rectal cancer Neg Hx    Stomach cancer Neg Hx     Social History   Tobacco Use   Smoking status: Every Day    Packs/day: 1.50    Types: Cigarettes   Smokeless tobacco: Never   Tobacco comments:    form given 08-02-16  Substance Use Topics   Alcohol use: No   Marital Status: Married  ROS  Review of Systems  Cardiovascular:  Positive for chest pain and dyspnea on exertion.  All other systems reviewed and are negative.  Objective  Blood pressure (!) 147/99, pulse 65, temperature 98 F (36.7 C), resp. rate 16, height 6' (1.829 m), weight 225 lb (102.1 kg), SpO2 95 %. Body mass index is 30.52 kg/m.     05/18/2022    3:15 PM 05/18/2022    3:09 PM 12/01/2021    5:26 PM  Vitals with BMI  Height  '6\' 0"'$    Weight  225 lbs   BMI  40.98   Systolic 119 147 829  Diastolic 99 91 63  Pulse 65 68 61     Physical Exam Constitutional:      Appearance: He is well-developed.  HENT:  Head: Normocephalic and atraumatic.  Neck:     Vascular: No JVD.  Cardiovascular:     Rate and Rhythm: Normal rate and regular rhythm.     Pulses: Normal pulses.     Heart sounds: No murmur heard. Pulmonary:     Effort: Pulmonary effort is normal.     Breath sounds: Normal breath sounds.  Abdominal:     General: Bowel sounds are normal.     Palpations: Abdomen is soft.  Musculoskeletal:     Right lower leg: No edema.     Left lower leg: No edema.  Skin:    General: Skin is warm and dry.  Neurological:     Mental Status: He is alert.    Medications and allergies  No Known Allergies   Medication list after today's encounter   Current Outpatient Medications:    acetaminophen (TYLENOL) 500 MG tablet, Take 1 tablet (500 mg total) by mouth every 6 (six) hours as needed., Disp: 30 tablet, Rfl: 0   diclofenac Sodium (VOLTAREN) 1 % GEL, SMARTSIG:Gram(s) Topical 4 Times Daily, Disp: , Rfl:    losartan (COZAAR) 50 MG tablet, Take 1 tablet (50 mg total)  by mouth at bedtime., Disp: 30 tablet, Rfl: 2   ondansetron (ZOFRAN-ODT) 4 MG disintegrating tablet, Take 1 tablet (4 mg total) by mouth every 8 (eight) hours as needed for nausea or vomiting., Disp: 5 tablet, Rfl: 0   rosuvastatin (CRESTOR) 40 MG tablet, Take 1 tablet (40 mg total) by mouth at bedtime., Disp: 30 tablet, Rfl: 2  Laboratory examination:   Lab Results  Component Value Date   NA 141 12/01/2021   K 3.7 12/01/2021   CO2 23 12/01/2021   GLUCOSE 94 12/01/2021   BUN 15 12/01/2021   CREATININE 1.07 12/01/2021   CALCIUM 8.7 (L) 12/01/2021   GFRNONAA >60 12/01/2021       Latest Ref Rng & Units 12/01/2021    3:33 PM 10/21/2021    1:40 PM 08/02/2021    5:58 PM  CMP  Glucose 70 - 99 mg/dL 94  92  91   BUN 6 - 20 mg/dL '15  12  7   '$ Creatinine 0.61 - 1.24 mg/dL 1.07  0.81  0.80   Sodium 135 - 145 mmol/L 141  136  142   Potassium 3.5 - 5.1 mmol/L 3.7  4.5  3.3   Chloride 98 - 111 mmol/L 109  107  106   CO2 22 - 32 mmol/L 23  23    Calcium 8.9 - 10.3 mg/dL 8.7  9.1    Total Protein 6.5 - 8.1 g/dL 7.0  7.3    Total Bilirubin 0.3 - 1.2 mg/dL 0.5  1.8    Alkaline Phos 38 - 126 U/L 46  43    AST 15 - 41 U/L 21  19    ALT 0 - 44 U/L 15  14        Latest Ref Rng & Units 12/01/2021    3:33 PM 10/21/2021    1:40 PM 08/02/2021    5:58 PM  CBC  WBC 4.0 - 10.5 K/uL 7.9  11.5    Hemoglobin 13.0 - 17.0 g/dL 15.0  15.8  15.0   Hematocrit 39.0 - 52.0 % 43.2  46.1  44.0   Platelets 150 - 400 K/uL 219  205      Lipid Panel No results for input(s): "CHOL", "TRIG", "[place]", "VLDL", "HDL", "CHOLHDL", "LDLDIRECT" in the last 8760 hours.  HEMOGLOBIN A1C  Lab Results  Component Value Date   HGBA1C 5.4 08/29/2016   TSH No results for input(s): "TSH" in the last 8760 hours.  External labs:     Radiology:    Cardiac Studies:   No results found for this or any previous visit from the past 1095 days.     No results found for this or any previous visit from the past 1095  days.     EKG:     05/18/2022 EKG: NSR, normal   Assessment     ICD-10-CM   1. Dyspnea on exertion  R06.09 PCV ECHOCARDIOGRAM COMPLETE    PCV MYOCARDIAL PERFUSION WO LEXISCAN    Hgb A1c w/o eAG    Lipid Panel With LDL/HDL Ratio    High sensitivity CRP    Basic metabolic panel    TSH    Magnesium    2. Shortness of breath  R06.02 EKG 12-Lead    PCV ECHOCARDIOGRAM COMPLETE    PCV MYOCARDIAL PERFUSION WO LEXISCAN    Hgb A1c w/o eAG    Lipid Panel With LDL/HDL Ratio    High sensitivity CRP    Basic metabolic panel    TSH    Magnesium    3. Nonspecific abnormal electrocardiogram (ECG) (EKG)  R94.31 PCV ECHOCARDIOGRAM COMPLETE    PCV MYOCARDIAL PERFUSION WO LEXISCAN    Hgb A1c w/o eAG    Lipid Panel With LDL/HDL Ratio    High sensitivity CRP    Basic metabolic panel    TSH    Magnesium    4. Tobacco abuse  Z72.0 PCV ECHOCARDIOGRAM COMPLETE    PCV MYOCARDIAL PERFUSION WO LEXISCAN    Hgb A1c w/o eAG    Lipid Panel With LDL/HDL Ratio    High sensitivity CRP    Basic metabolic panel    TSH    Magnesium    5. Family history of MI (myocardial infarction)  Z82.49     61. Essential hypertension  I10        Orders Placed This Encounter  Procedures   Hgb A1c w/o eAG   Lipid Panel With LDL/HDL Ratio   High sensitivity CRP   Basic metabolic panel   TSH   Magnesium   PCV MYOCARDIAL PERFUSION WO LEXISCAN    Standing Status:   Future    Standing Expiration Date:   07/18/2022   EKG 12-Lead   PCV ECHOCARDIOGRAM COMPLETE    Standing Status:   Future    Standing Expiration Date:   05/19/2023    Meds ordered this encounter  Medications   rosuvastatin (CRESTOR) 40 MG tablet    Sig: Take 1 tablet (40 mg total) by mouth at bedtime.    Dispense:  30 tablet    Refill:  2   losartan (COZAAR) 50 MG tablet    Sig: Take 1 tablet (50 mg total) by mouth at bedtime.    Dispense:  30 tablet    Refill:  2    Medications Discontinued During This Encounter  Medication Reason    clindamycin (CLEOCIN) 150 MG capsule    acetaminophen (TYLENOL) 500 MG tablet    methocarbamol (ROBAXIN) 500 MG tablet    oxyCODONE-acetaminophen (PERCOCET/ROXICET) 5-325 MG tablet    predniSONE (DELTASONE) 10 MG tablet    rosuvastatin (CRESTOR) 10 MG tablet    ibuprofen (ADVIL) 600 MG tablet      Recommendations:   Eric Peck is a 46 y.o.  male with family history of early heart disease who  is here with DOE  Father had massive MI at age 30 Grandfather had early heart disease as well Start crestor '40mg'$  and losartan '50mg'$  Labs ordered, repeat lipids in 1-2 months Schedule stress and echo Avoid ibuprofen, take ASA if needed for pain Follow-up in 4 weeks or sooner if needed    Floydene Flock, DO, [facility]  05/18/2022, 3:35 PM Office: [phone] Pager: [phone]

## 2022-05-22 ENCOUNTER — Ambulatory Visit: Payer: 59

## 2022-05-22 DIAGNOSIS — R0602 Shortness of breath: Secondary | ICD-10-CM

## 2022-05-22 DIAGNOSIS — R0609 Other forms of dyspnea: Secondary | ICD-10-CM

## 2022-05-22 DIAGNOSIS — R9431 Abnormal electrocardiogram [ECG] [EKG]: Secondary | ICD-10-CM

## 2022-05-22 DIAGNOSIS — Z720 Tobacco use: Secondary | ICD-10-CM

## 2022-05-26 ENCOUNTER — Ambulatory Visit: Payer: 59

## 2022-05-26 DIAGNOSIS — R0609 Other forms of dyspnea: Secondary | ICD-10-CM

## 2022-05-26 DIAGNOSIS — Z720 Tobacco use: Secondary | ICD-10-CM

## 2022-05-26 DIAGNOSIS — R9431 Abnormal electrocardiogram [ECG] [EKG]: Secondary | ICD-10-CM

## 2022-05-26 DIAGNOSIS — R0602 Shortness of breath: Secondary | ICD-10-CM

## 2022-05-26 NOTE — Progress Notes (Signed)
Echo normal with some LVH so nuc likely hypertensive changes

## 2022-05-26 NOTE — Progress Notes (Signed)
Can you let him know nuclear was probably result of hypertensive response and echo is normal other than wal thickening which is from HTN and we will just manage that. Thank yu!

## 2022-05-31 NOTE — Progress Notes (Signed)
Called and spoke with patient wife regarding his echocardiogram results.

## 2022-06-01 ENCOUNTER — Ambulatory Visit: Payer: Commercial Managed Care - HMO | Admitting: Podiatry

## 2022-06-01 ENCOUNTER — Ambulatory Visit (INDEPENDENT_AMBULATORY_CARE_PROVIDER_SITE_OTHER): Payer: Commercial Managed Care - HMO

## 2022-06-01 DIAGNOSIS — Z79899 Other long term (current) drug therapy: Secondary | ICD-10-CM | POA: Diagnosis not present

## 2022-06-01 DIAGNOSIS — M779 Enthesopathy, unspecified: Secondary | ICD-10-CM

## 2022-06-01 DIAGNOSIS — B351 Tinea unguium: Secondary | ICD-10-CM | POA: Diagnosis not present

## 2022-06-01 DIAGNOSIS — M205X1 Other deformities of toe(s) (acquired), right foot: Secondary | ICD-10-CM | POA: Diagnosis not present

## 2022-06-01 NOTE — Patient Instructions (Signed)
You can use UREA NAIL GEL    Terbinafine Tablets What is this medication? TERBINAFINE (TER bin a feen) treats fungal infections of the nails. It belongs to a group of medications called antifungals. It will not treat infections caused by bacteria or viruses. This medicine may be used for other purposes; ask your health care provider or pharmacist if you have questions. COMMON BRAND NAME(S): Lamisil, Terbinex What should I tell my care team before I take this medication? They need to know if you have any of these conditions: Liver disease An unusual or allergic reaction to terbinafine, other medications, foods, dyes, or preservatives Pregnant or trying to get pregnant Breast-feeding How should I use this medication? Take this medication by mouth with water. Take it as directed on the prescription label at the same time every day. You can take it with or without food. If it upsets your stomach, take it with food. Keep taking it unless your care team tells you to stop. A special MedGuide will be given to you by the pharmacist with each prescription and refill. Be sure to read this information carefully each time. Talk to your care team regarding the use of this medication in children. Special care may be needed. Overdosage: If you think you have taken too much of this medicine contact a poison control center or emergency room at once. NOTE: This medicine is only for you. Do not share this medicine with others. What if I miss a dose? If you miss a dose, take it as soon as you can unless it is more than 4 hours late. If it is more than 4 hours late, skip the missed dose. Take the next dose at the normal time. What may interact with this medication? Do not take this medication with any of the following: Pimozide Thioridazine This medication may also interact with the following: Beta blockers Caffeine Certain medications for mental health conditions Cimetidine Cyclosporine Medications for  fungal infections like fluconazole and ketoconazole Medications for irregular heartbeat like amiodarone, flecainide and propafenone Rifampin Warfarin This list may not describe all possible interactions. Give your health care provider a list of all the medicines, herbs, non-prescription drugs, or dietary supplements you use. Also tell them if you smoke, drink alcohol, or use illegal drugs. Some items may interact with your medicine. What should I watch for while using this medication? Visit your care team for regular checks on your progress. You may need blood work while you are taking this medication. It may be some time before you see the benefit from this medication. This medication may cause serious skin reactions. They can happen weeks to months after starting the medication. Contact your care team right away if you notice fevers or flu-like symptoms with a rash. The rash may be red or purple and then turn into blisters or peeling of the skin. Or, you might notice a red rash with swelling of the face, lips or lymph nodes in your neck or under your arms. This medication can make you more sensitive to the sun. Keep out of the sun, If you cannot avoid being in the sun, wear protective clothing and sunscreen. Do not use sun lamps or tanning beds/booths. What side effects may I notice from receiving this medication? Side effects that you should report to your care team as soon as possible: Allergic reactions--skin rash, itching, hives, swelling of the face, lips, tongue, or throat Change in sense of smell Change in taste Infection--fever, chills, cough, or sore throat  Liver injury--right upper belly pain, loss of appetite, nausea, light-colored stool, dark yellow or brown urine, yellowing skin or eyes, unusual weakness or fatigue Low red blood cell level--unusual weakness or fatigue, dizziness, headache, trouble breathing Lupus-like syndrome--joint pain, swelling, or stiffness, butterfly-shaped rash  on the face, rashes that get worse in the sun, fever, unusual weakness or fatigue Rash, fever, and swollen lymph nodes Redness, blistering, peeling, or loosening of the skin, including inside the mouth Unusual bruising or bleeding Worsening mood, feelings of depression Side effects that usually do not require medical attention (report to your care team if they continue or are bothersome): Diarrhea Gas Headache Nausea Stomach pain Upset stomach This list may not describe all possible side effects. Call your doctor for medical advice about side effects. You may report side effects to FDA at 1-800-FDA-1088. Where should I keep my medication? Keep out of the reach of children and pets. Store between 20 and 25 degrees C (68 and 77 degrees F). Protect from light. Get rid of any unused medication after the expiration date. To get rid of medications that are no longer needed or have expired: Take the medication to a medication take-back program. Check with your pharmacy or law enforcement to find a location. If you cannot return the medication, check the label or package insert to see if the medication should be thrown out in the garbage or flushed down the toilet. If you are not sure, ask your care team. If it is safe to put it in the trash, take the medication out of the container. Mix the medication with cat litter, dirt, coffee grounds, or other unwanted substance. Seal the mixture in a bag or container. Put it in the trash. NOTE: This sheet is a summary. It may not cover all possible information. If you have questions about this medicine, talk to your doctor, pharmacist, or health care provider.  2023 Elsevier/Gold Standard (2021-04-12 00:00:00)

## 2022-06-01 NOTE — Progress Notes (Signed)
Subjective:   Patient ID: Eric Peck, male   DOB: 46 y.o.   MRN: [id number]   HPI Chief Complaint  Patient presents with   Foot Pain    Bilateral 5th toenail pain. Patient stated it has been bothering for the past years.     46 year old male presents the above concerns.  He states his pain is to the digit toenails.  Tried over-the-counter medications he tries to trim them himself.  Hurts when he gets along and rub inside of his shoes.  No swelling redness or drainage.  No open lesions.  No other concerns.  No injuries.  Review of Systems  All other systems reviewed and are negative.  Past Medical History:  Diagnosis Date   Anxiety    Arthritis    CTS (carpal tunnel syndrome)    Diverticulitis of large intestine with perforation 03/2015   microperf of sigmoid colitis, tx with abx, no surg   GERD (gastroesophageal reflux disease)    Hypertension 11/06/2016    Past Surgical History:  Procedure Laterality Date   COLONOSCOPY N/A 04/23/2015   Procedure: DIAGNOSTIC COLONOSCOPY;  Surgeon: [name], MD;  Location: [facility];  Service: Endoscopy;  Laterality: N/A;   VASECTOMY       Current Outpatient Medications:    acetaminophen (TYLENOL) 500 MG tablet, Take 1 tablet (500 mg total) by mouth every 6 (six) hours as needed., Disp: 30 tablet, Rfl: 0   diclofenac Sodium (VOLTAREN) 1 % GEL, SMARTSIG:Gram(s) Topical 4 Times Daily, Disp: , Rfl:    losartan (COZAAR) 50 MG tablet, Take 1 tablet (50 mg total) by mouth at bedtime., Disp: 30 tablet, Rfl: 2   ondansetron (ZOFRAN-ODT) 4 MG disintegrating tablet, Take 1 tablet (4 mg total) by mouth every 8 (eight) hours as needed for nausea or vomiting., Disp: 5 tablet, Rfl: 0   rosuvastatin (CRESTOR) 40 MG tablet, Take 1 tablet (40 mg total) by mouth at bedtime., Disp: 30 tablet, Rfl: 2   terbinafine (LAMISIL) 250 MG tablet, Take 1 tablet (250 mg total) by mouth daily., Disp: 90 tablet, Rfl: 0  No Known Allergies        Objective:   Physical Exam  General: AAO x3, NAD  Dermatological: Bilateral fifth digit nails are hypertrophic, dystrophic with yellow, brown discoloration.  On the right fifth toe just adjacent to the toenail is a thick hyperkeratotic lesion.  There is no underlying ulceration drainage or signs of infection.  In general the other nails are discolored as well but not as hypertrophic.  Vascular: Dorsalis Pedis artery and Posterior Tibial artery pedal pulses are 2/4 bilateral with immedate capillary fill time. There is no pain with calf compression, swelling, warmth, erythema.   Neruologic: Grossly intact via light touch bilateral.   Musculoskeletal: Adductovarus present fifth toes muscular strength 5/5 in all groups tested bilateral.  Gait: Unassisted, Nonantalgic.       Assessment:   Onychomycosis bilaterally, listers corn right fifth toe     Plan:  -Treatment options discussed including all alternatives, risks, and complications. -Etiology of symptoms were discussed -Discussed treatment options for nail fungus including oral, topical as well as alternative treatments.  We discussed nail removal today as well but he was told off on this.  At this time the patient wants to proceed with oral Lamisil.  We discussed side effects of the medication and success rates.  We will check a CBC and LFT prior to starting the medication.  Once I receive the results of this  I will then call the medication in.  -If the oral medication upsets his stomach we will switch to topical -Discussed urea nail gel as well -Debrided the hyperkeratotic lesion as a courtesy right fifth toe without any complications or bleeding.  No follow-ups on file.  Trula Slade DPM

## 2022-06-02 ENCOUNTER — Other Ambulatory Visit: Payer: Self-pay | Admitting: Podiatry

## 2022-06-02 DIAGNOSIS — Z79899 Other long term (current) drug therapy: Secondary | ICD-10-CM

## 2022-06-02 LAB — CBC WITH DIFFERENTIAL/PLATELET
Absolute Monocytes: 481 cells/uL (ref 200–950)
Basophils Absolute: 39 cells/uL (ref 0–200)
Basophils Relative: 0.6 %
Eosinophils Absolute: 364 cells/uL (ref 15–500)
Eosinophils Relative: 5.6 %
HCT: 43.6 % (ref 38.5–50.0)
Hemoglobin: 15 g/dL (ref 13.2–17.1)
Lymphs Abs: 2217 cells/uL (ref 850–3900)
MCH: 29.5 pg (ref 27.0–33.0)
MCHC: 34.4 g/dL (ref 32.0–36.0)
MCV: 85.8 fL (ref 80.0–100.0)
MPV: 10.1 fL (ref 7.5–12.5)
Monocytes Relative: 7.4 %
Neutro Abs: 3400 cells/uL (ref 1500–7800)
Neutrophils Relative %: 52.3 %
Platelets: 206 10*3/uL (ref 140–400)
RBC: 5.08 10*6/uL (ref 4.20–5.80)
RDW: 13.6 % (ref 11.0–15.0)
Total Lymphocyte: 34.1 %
WBC: 6.5 10*3/uL (ref 3.8–10.8)

## 2022-06-02 LAB — HEPATIC FUNCTION PANEL
AG Ratio: 1.9 (calc) (ref 1.0–2.5)
ALT: 12 U/L (ref 9–46)
AST: 13 U/L (ref 10–40)
Albumin: 4.3 g/dL (ref 3.6–5.1)
Alkaline phosphatase (APISO): 54 U/L (ref 36–130)
Bilirubin, Direct: 0.1 mg/dL (ref 0.0–0.2)
Globulin: 2.3 g/dL (calc) (ref 1.9–3.7)
Indirect Bilirubin: 0.2 mg/dL (calc) (ref 0.2–1.2)
Total Bilirubin: 0.3 mg/dL (ref 0.2–1.2)
Total Protein: 6.6 g/dL (ref 6.1–8.1)

## 2022-06-02 MED ORDER — TERBINAFINE HCL 250 MG PO TABS: 250.0000 mg | ORAL_TABLET | Freq: Every day | ORAL | 0 refills | Status: AC

## 2022-06-08 LAB — BASIC METABOLIC PANEL
BUN/Creatinine Ratio: 9 (ref 9–20)
BUN: 8 mg/dL (ref 6–24)
CO2: 22 mmol/L (ref 20–29)
Calcium: 9.3 mg/dL (ref 8.7–10.2)
Chloride: 105 mmol/L (ref 96–106)
Creatinine, Ser: 0.92 mg/dL (ref 0.76–1.27)
Glucose: 90 mg/dL (ref 70–99)
Potassium: 3.8 mmol/L (ref 3.5–5.2)
Sodium: 141 mmol/L (ref 134–144)
eGFR: 105 mL/min/{1.73_m2} (ref >59)

## 2022-06-08 LAB — LIPID PANEL WITH LDL/HDL RATIO
Cholesterol, Total: 195 mg/dL (ref 100–199)
HDL: 32 mg/dL — ABNORMAL LOW (ref >39)
LDL Chol Calc (NIH): 125 mg/dL — ABNORMAL HIGH (ref 0–99)
LDL/HDL Ratio: 3.9 ratio — ABNORMAL HIGH (ref 0.0–3.6)
Triglycerides: 212 mg/dL — ABNORMAL HIGH (ref 0–149)
VLDL Cholesterol Cal: 38 mg/dL (ref 5–40)

## 2022-06-08 LAB — HGB A1C W/O EAG: Hgb A1c MFr Bld: 5.5 % (ref 4.8–5.6)

## 2022-06-08 LAB — MAGNESIUM: Magnesium: 2.1 mg/dL (ref 1.6–2.3)

## 2022-06-08 LAB — TSH: TSH: 2.19 u[IU]/mL (ref 0.450–4.500)

## 2022-06-08 LAB — HIGH SENSITIVITY CRP: CRP, High Sensitivity: 6.58 mg/L — ABNORMAL HIGH (ref 0.00–3.00)

## 2022-06-14 ENCOUNTER — Emergency Department (HOSPITAL_BASED_OUTPATIENT_CLINIC_OR_DEPARTMENT_OTHER)
Admission: EM | Admit: 2022-06-14 | Discharge: 2022-06-14 | Disposition: A | Discharge status: Home / Self Care | Payer: Commercial Managed Care - HMO | Attending: Emergency Medicine | Admitting: Emergency Medicine

## 2022-06-14 ENCOUNTER — Emergency Department (HOSPITAL_BASED_OUTPATIENT_CLINIC_OR_DEPARTMENT_OTHER): Payer: Commercial Managed Care - HMO

## 2022-06-14 ENCOUNTER — Encounter (HOSPITAL_BASED_OUTPATIENT_CLINIC_OR_DEPARTMENT_OTHER): Payer: Self-pay

## 2022-06-14 ENCOUNTER — Other Ambulatory Visit: Payer: Self-pay

## 2022-06-14 DIAGNOSIS — S61211A Laceration without foreign body of left index finger without damage to nail, initial encounter: Secondary | ICD-10-CM | POA: Insufficient documentation

## 2022-06-14 DIAGNOSIS — I10 Essential (primary) hypertension: Secondary | ICD-10-CM | POA: Diagnosis not present

## 2022-06-14 DIAGNOSIS — W268XXA Contact with other sharp object(s), not elsewhere classified, initial encounter: Secondary | ICD-10-CM | POA: Diagnosis not present

## 2022-06-14 DIAGNOSIS — Z79899 Other long term (current) drug therapy: Secondary | ICD-10-CM | POA: Diagnosis not present

## 2022-06-14 DIAGNOSIS — S6992XA Unspecified injury of left wrist, hand and finger(s), initial encounter: Secondary | ICD-10-CM | POA: Diagnosis present

## 2022-06-14 MED ORDER — LIDOCAINE HCL (PF) 1 % IJ SOLN
5.0000 mL | Freq: Once | INTRAMUSCULAR | Status: AC
  Administered 2022-06-14: 5 mL
  Filled 2022-06-14: qty 5

## 2022-06-14 NOTE — ED Provider Notes (Signed)
[facility] Provider Note   CSN: [id number] Arrival date & time: 06/14/22  2200     History  Chief Complaint  Patient presents with   Finger Injury    Eric Peck is a 46 y.o. male.  HPI   46 year old male presents emergency department with complaints of finger laceration.  Patient states that he was prying a piece of metal when his crowbar slipped and he cut his finger on a piece of metal.  Cut his on the dorsal aspect of patient's left index finger.  He denies any weakness or sensory deficits in affected hand.  He states his last tetanus was approximately 3 months ago.  Bleeding is short with direct pressure.  Patient states he is right-handed.  Past medical history significant for hypertension, anxiety, GERD  Home Medications Prior to Admission medications   Medication Sig Start Date End Date Taking? Authorizing Provider  acetaminophen (TYLENOL) 500 MG tablet Take 1 tablet (500 mg total) by mouth every 6 (six) hours as needed. 08/02/21   Varney Biles, MD  diclofenac Sodium (VOLTAREN) 1 % GEL SMARTSIG:Gram(s) Topical 4 Times Daily 04/26/22   [provider]  losartan (COZAAR) 50 MG tablet Take 1 tablet (50 mg total) by mouth at bedtime. 05/18/22 08/16/22  Custovic, Collene Mares, DO  ondansetron (ZOFRAN-ODT) 4 MG disintegrating tablet Take 1 tablet (4 mg total) by mouth every 8 (eight) hours as needed for nausea or vomiting. 12/01/21   Khatri, Hina, PA-C  rosuvastatin (CRESTOR) 40 MG tablet Take 1 tablet (40 mg total) by mouth at bedtime. 05/18/22 08/16/22  Custovic, Collene Mares, DO  terbinafine (LAMISIL) 250 MG tablet Take 1 tablet (250 mg total) by mouth daily. 06/02/22   Trula Slade, DPM      Allergies    Patient has no known allergies.    Review of Systems   Review of Systems  All other systems reviewed and are negative.   Physical Exam Updated Vital Signs BP (!) 144/97 (BP Location: Right Arm)   Pulse 76   Temp 98 F (36.7 C)    Resp 18   Ht 6' (1.829 m)   Wt 104.3 kg   SpO2 97%   BMI 31.19 kg/m  Physical Exam Vitals and nursing note reviewed.  Constitutional:      General: He is not in acute distress.    Appearance: He is well-developed.  HENT:     Head: Normocephalic and atraumatic.  Eyes:     Conjunctiva/sclera: Conjunctivae normal.  Cardiovascular:     Rate and Rhythm: Normal rate and regular rhythm.     Heart sounds: No murmur heard. Pulmonary:     Effort: Pulmonary effort is normal. No respiratory distress.     Breath sounds: Normal breath sounds.  Abdominal:     Palpations: Abdomen is soft.     Tenderness: There is no abdominal tenderness.  Musculoskeletal:        General: No swelling.     Cervical back: Neck supple.  Skin:    General: Skin is warm and dry.     Capillary Refill: Capillary refill takes less than 2 seconds.     Comments: 1 to 1.5 cm laceration noted on the dorsal aspect of patient's second proximal phalanx of upper extremity.  Patient has full range of motion of affected extremity.  Bleeding controlled with direct pressure.  No obvious contamination or tendon involvement upon initial evaluation.  No sensory deficits distal to said injury.  Capillary refill less  than 2 seconds.  Neurological:     Mental Status: He is alert.  Psychiatric:        Mood and Affect: Mood normal.     ED Results / Procedures / Treatments   Labs (all labs ordered are listed, but only abnormal results are displayed) Labs Reviewed - No data to display  EKG None  Radiology DG Finger Index Left  Result Date: 06/14/2022 CLINICAL DATA:  Laceration to left index finger EXAM: LEFT INDEX FINGER 2+V COMPARISON:  None Available. FINDINGS: There is no evidence of fracture or dislocation. There is no evidence of arthropathy or other focal bone abnormality. Soft tissues are unremarkable. No radiopaque foreign body. IMPRESSION: Negative. Electronically Signed   By: Rolm Baptise M.D.   On: 06/14/2022 22:32     Procedures .Marland KitchenLaceration Repair  Date/Time: 06/14/2022 11:20 PM  Performed by: Wilnette Kales, PA Authorized by: Wilnette Kales, PA   Consent:    Consent obtained:  Verbal   Consent given by:  Patient   Risks, benefits, and alternatives were discussed: yes     Risks discussed:  Infection, need for additional repair, nerve damage, poor wound healing, poor cosmetic result, vascular damage, tendon damage, retained foreign body and pain   Alternatives discussed:  No treatment, delayed treatment, referral and observation Universal protocol:    Procedure explained and questions answered to patient or proxy's satisfaction: yes     Patient identity confirmed:  Verbally with patient Anesthesia:    Anesthesia method:  Local infiltration   Local anesthetic:  Lidocaine 1% w/o epi Laceration details:    Location:  Finger   Finger location:  L index finger   Length (cm):  2   Depth (mm):  8 Pre-procedure details:    Preparation:  Patient was prepped and draped in usual sterile fashion and imaging obtained to evaluate for foreign bodies Exploration:    Limited defect created (wound extended): no     Hemostasis achieved with:  Direct pressure   Imaging obtained: x-ray     Imaging outcome: foreign body not noted     Wound exploration: wound explored through full range of motion and entire depth of wound visualized     Contaminated: no   Treatment:    Area cleansed with:  Chlorhexidine and saline   Amount of cleaning:  Extensive   Irrigation solution:  Sterile saline   Irrigation volume:  500cc   Irrigation method:  Syringe   Visualized foreign bodies/material removed: no     Debridement:  None   Undermining:  None   Scar revision: no   Skin repair:    Repair method:  Sutures   Suture size:  4-0   Suture material:  Nylon   Suture technique:  Simple interrupted   Number of sutures:  2 Approximation:    Approximation:  Close Repair type:    Repair type:  Simple Post-procedure  details:    Dressing:  Splint for protection   Procedure completion:  Tolerated well, no immediate complications     Medications Ordered in ED Medications  lidocaine (PF) (XYLOCAINE) 1 % injection 5 mL (5 mLs Infiltration Given by Other 06/14/22 2254)    ED Course/ Medical Decision Making/ A&P                           Medical Decision Making Amount and/or Complexity of Data Reviewed Radiology: ordered.   This patient presents to the ED for  concern of finger pain, this involves an extensive number of treatment options, and is a complaint that carries with it a high risk of complications and morbidity.  The differential diagnosis includes open fracture, fracture, strain/sprain, ligamentous/tendinous injury, neurovascular compromise   Co morbidities that complicate the patient evaluation  See HPI   Additional history obtained:  Additional history obtained from EMR   Lab Tests:  N/a   Imaging Studies ordered:  I ordered imaging studies including left index finger x-ray I independently visualized and interpreted imaging which showed no acute abnormalities I agree with the radiologist interpretation   Cardiac Monitoring: / EKG:  The patient was maintained on a cardiac monitor.  I personally viewed and interpreted the cardiac monitored which showed an underlying rhythm of: Sinus rhythm   Consultations Obtained:  N/a   Problem List / ED Course / Critical interventions / Medication management  Finger laceration I ordered medication including lidocaine for local infiltrative anesthetic   Reevaluation of the patient after these medicines showed that the patient improved I have reviewed the patients home medicines and have made adjustments as needed   Social Determinants of Health:  Denies illicit drug use.  Patient does state that he smokes tobacco every day.   Test / Admission - Considered:  Finger laceration Vitals signs significant for hypertension with  blood pressure 144/97.  Recommend close follow-up with PCP regarding elevation of blood pressure. Otherwise within normal range and stable throughout visit. Imaging studies significant for: See above Laceration was repaired in manner above.  Given relatively clean wound and extensive cleaning while in the ED, prophylactic antibiotics deemed unnecessary at this time.  Patient reported the metal as without contamination.  Finger splint was applied in the emergency department given that laceration was over the distal joint.  Follow-up with PCP, urgent care, ED recommended 10 to 14-day for suture removal.  Treatment plans discussed with patient he knowledge understands agreeable to said plan. Worrisome signs and symptoms were discussed with the patient, and the patient acknowledged understanding to return to the ED if noticed. Patient was stable upon discharge.         Final Clinical Impression(s) / ED Diagnoses Final diagnoses:  Laceration of left index finger without foreign body without damage to nail, initial encounter    Rx / DC Orders ED Discharge Orders     None         Wilnette Kales, [place] 06/14/22 2332    Fransico Meadow, MD 06/15/22 1218

## 2022-06-14 NOTE — Discharge Instructions (Addendum)
Note the work-up today was overall reassuring.  Your laceration was pared using 2 sutures as we discussed, no need for prophylactic antibiotics at this time given clean nature of wound.  Wear the splint as much as possible to keep joints straight as the laceration was over that joint as we discussed.  Wash area with warm soapy water.  Avoid submersion in bodies of water.  Please do not hesitate to return to the emergency department for worrisome signs symptoms we discussed become apparent.

## 2022-06-14 NOTE — ED Triage Notes (Signed)
Metal cut 2nd finger left hand.  Has TETANUS shot 3 months ago for similar issue.  Controlled bleeding at this time.

## 2022-06-15 ENCOUNTER — Ambulatory Visit: Payer: 59 | Admitting: Internal Medicine

## 2022-07-14 ENCOUNTER — Encounter (HOSPITAL_BASED_OUTPATIENT_CLINIC_OR_DEPARTMENT_OTHER): Payer: Self-pay | Admitting: Emergency Medicine

## 2022-07-14 ENCOUNTER — Emergency Department (HOSPITAL_BASED_OUTPATIENT_CLINIC_OR_DEPARTMENT_OTHER): Payer: Commercial Managed Care - HMO

## 2022-07-14 ENCOUNTER — Other Ambulatory Visit: Payer: Self-pay

## 2022-07-14 ENCOUNTER — Emergency Department (HOSPITAL_BASED_OUTPATIENT_CLINIC_OR_DEPARTMENT_OTHER)
Admission: EM | Admit: 2022-07-14 | Discharge: 2022-07-15 | Disposition: A | Discharge status: Home / Self Care | Payer: Commercial Managed Care - HMO | Attending: Emergency Medicine | Admitting: Emergency Medicine

## 2022-07-14 DIAGNOSIS — Z20822 Contact with and (suspected) exposure to covid-19: Secondary | ICD-10-CM | POA: Diagnosis not present

## 2022-07-14 DIAGNOSIS — K529 Noninfective gastroenteritis and colitis, unspecified: Secondary | ICD-10-CM

## 2022-07-14 DIAGNOSIS — R112 Nausea with vomiting, unspecified: Secondary | ICD-10-CM | POA: Diagnosis present

## 2022-07-14 DIAGNOSIS — R000 Tachycardia, unspecified: Secondary | ICD-10-CM | POA: Diagnosis not present

## 2022-07-14 LAB — COMPREHENSIVE METABOLIC PANEL
ALT: 20 U/L (ref 0–44)
AST: 20 U/L (ref 15–41)
Albumin: 4.3 g/dL (ref 3.5–5.0)
Alkaline Phosphatase: 58 U/L (ref 38–126)
Anion gap: 10 (ref 5–15)
BUN: 14 mg/dL (ref 6–20)
CO2: 20 mmol/L — ABNORMAL LOW (ref 22–32)
Calcium: 9.1 mg/dL (ref 8.9–10.3)
Chloride: 105 mmol/L (ref 98–111)
Creatinine, Ser: 1.18 mg/dL (ref 0.61–1.24)
GFR, Estimated: >60 mL/min (ref >60)
Glucose, Bld: 111 mg/dL — ABNORMAL HIGH (ref 70–99)
Potassium: 3.6 mmol/L (ref 3.5–5.1)
Sodium: 135 mmol/L (ref 135–145)
Total Bilirubin: 0.7 mg/dL (ref 0.3–1.2)
Total Protein: 8.3 g/dL — ABNORMAL HIGH (ref 6.5–8.1)

## 2022-07-14 LAB — URINALYSIS, ROUTINE W REFLEX MICROSCOPIC
Glucose, UA: NEGATIVE mg/dL
Ketones, ur: 15 mg/dL — AB
Leukocytes,Ua: NEGATIVE
Nitrite: NEGATIVE
Protein, ur: 100 mg/dL — AB
Specific Gravity, Urine: 1.025 (ref 1.005–1.030)
pH: 5.5 (ref 5.0–8.0)

## 2022-07-14 LAB — RESP PANEL BY RT-PCR (FLU A&B, COVID) ARPGX2
Influenza A by PCR: NEGATIVE
Influenza B by PCR: NEGATIVE
SARS Coronavirus 2 by RT PCR: NEGATIVE

## 2022-07-14 LAB — CBC
HCT: 50.4 % (ref 39.0–52.0)
Hemoglobin: 17.7 g/dL — ABNORMAL HIGH (ref 13.0–17.0)
MCH: 29.6 pg (ref 26.0–34.0)
MCHC: 35.1 g/dL (ref 30.0–36.0)
MCV: 84.3 fL (ref 80.0–100.0)
Platelets: 196 10*3/uL (ref 150–400)
RBC: 5.98 MIL/uL — ABNORMAL HIGH (ref 4.22–5.81)
RDW: 13.6 % (ref 11.5–15.5)
WBC: 8.9 10*3/uL (ref 4.0–10.5)
nRBC: 0 % (ref 0.0–0.2)

## 2022-07-14 LAB — URINALYSIS, MICROSCOPIC (REFLEX): WBC, UA: NONE SEEN WBC/hpf (ref 0–5)

## 2022-07-14 LAB — LIPASE, BLOOD: Lipase: 30 U/L (ref 11–51)

## 2022-07-14 MED ORDER — HYDROCODONE-ACETAMINOPHEN 5-325 MG PO TABS
1.0000 | ORAL_TABLET | Freq: Once | ORAL | Status: AC
  Administered 2022-07-15: 1 via ORAL
  Filled 2022-07-14: qty 1

## 2022-07-14 MED ORDER — METRONIDAZOLE 500 MG PO TABS: 500.0000 mg | ORAL_TABLET | Freq: Two times a day (BID) | ORAL | 0 refills | Status: AC

## 2022-07-14 MED ORDER — METRONIDAZOLE 500 MG PO TABS
500.0000 mg | ORAL_TABLET | Freq: Once | ORAL | Status: AC
  Administered 2022-07-15: 500 mg via ORAL
  Filled 2022-07-14: qty 1

## 2022-07-14 MED ORDER — HYDROCODONE-ACETAMINOPHEN 5-325 MG PO TABS: 1.0000 | ORAL_TABLET | ORAL | 0 refills | Status: AC | PRN

## 2022-07-14 MED ORDER — IOHEXOL 300 MG/ML  SOLN
100.0000 mL | Freq: Once | INTRAMUSCULAR | Status: AC | PRN
  Administered 2022-07-14: 100 mL via INTRAVENOUS

## 2022-07-14 MED ORDER — METOCLOPRAMIDE HCL 5 MG/ML IJ SOLN
10.0000 mg | Freq: Once | INTRAMUSCULAR | Status: AC
  Administered 2022-07-14: 10 mg via INTRAVENOUS
  Filled 2022-07-14: qty 2

## 2022-07-14 MED ORDER — LACTATED RINGERS IV BOLUS
1000.0000 mL | Freq: Once | INTRAVENOUS | Status: AC
  Administered 2022-07-14: 1000 mL via INTRAVENOUS

## 2022-07-14 MED ORDER — HYDROMORPHONE HCL 1 MG/ML IJ SOLN
1.0000 mg | Freq: Once | INTRAMUSCULAR | Status: AC
  Administered 2022-07-14: 1 mg via INTRAVENOUS
  Filled 2022-07-14: qty 1

## 2022-07-14 MED ORDER — CIPROFLOXACIN HCL 500 MG PO TABS: 500.0000 mg | ORAL_TABLET | Freq: Two times a day (BID) | ORAL | 0 refills | Status: AC

## 2022-07-14 MED ORDER — CIPROFLOXACIN HCL 500 MG PO TABS
500.0000 mg | ORAL_TABLET | Freq: Once | ORAL | Status: AC
  Administered 2022-07-15: 500 mg via ORAL
  Filled 2022-07-14: qty 1

## 2022-07-14 NOTE — Discharge Instructions (Signed)
Your work-up today shows colitis which is inflammation of your colon.  This is likely from an infection.  You are being put on antibiotics.  Your stool studies are not going to come back tonight and so you can follow these up in [place].  Your family doctor can also follow these up and adjust medicines as needed.   If you develop new or worsening abdominal pain, blood in the stool, fevers, or any other new/concerning symptoms then return to the ER for evaluation.

## 2022-07-14 NOTE — ED Triage Notes (Signed)
Patient arrived via POV c/o N/V/D x 3 days with chills. Patient states 15 episodes of diarrhea, took zofran for nausea x 1 hr, pepto at 1500. Patient is AO x 4, VS WDL, normal gait.

## 2022-07-14 NOTE — ED Provider Notes (Signed)
[facility] Provider Note   CSN: [id number] Arrival date & time: 07/14/22  1932     History  Chief Complaint  Patient presents with   Nausea   Emesis   Diarrhea    Eric Peck is a 46 y.o. male.  HPI 46 year old male presents with vomiting, diarrhea and abdominal pain.  Diarrhea started 4 or 5 days ago and the vomiting has been about 3 or 4 days.  No blood but stool has been dark.  Has been hot and cold.  Diffuse abdominal pain/cramping, especially when going to the bathroom.  He feels a little short of breath as well, though he thinks is from not sleeping from all of the illness..  Pain is severe.  He feels generally weak.  Unable to keep down any fluids. Took zofran a few hours ago with no relief.  Home Medications Prior to Admission medications   Medication Sig Start Date End Date Taking? Authorizing Provider  ciprofloxacin (CIPRO) 500 MG tablet Take 1 tablet (500 mg total) by mouth every 12 (twelve) hours. 07/14/22  Yes Sherwood Gambler, MD  HYDROcodone-acetaminophen (NORCO) 5-325 MG tablet Take 1 tablet by mouth every 4 (four) hours as needed. 07/14/22  Yes Sherwood Gambler, MD  metroNIDAZOLE (FLAGYL) 500 MG tablet Take 1 tablet (500 mg total) by mouth 2 (two) times daily. 07/14/22  Yes Sherwood Gambler, MD  acetaminophen (TYLENOL) 500 MG tablet Take 1 tablet (500 mg total) by mouth every 6 (six) hours as needed. 08/02/21   Varney Biles, MD  diclofenac Sodium (VOLTAREN) 1 % GEL SMARTSIG:Gram(s) Topical 4 Times Daily 04/26/22   [provider]  losartan (COZAAR) 50 MG tablet Take 1 tablet (50 mg total) by mouth at bedtime. 05/18/22 08/16/22  Custovic, Collene Mares, DO  ondansetron (ZOFRAN-ODT) 4 MG disintegrating tablet Take 1 tablet (4 mg total) by mouth every 8 (eight) hours as needed for nausea or vomiting. 12/01/21   Khatri, Hina, PA-C  rosuvastatin (CRESTOR) 40 MG tablet Take 1 tablet (40 mg total) by mouth at bedtime. 05/18/22 08/16/22   Custovic, Collene Mares, DO  terbinafine (LAMISIL) 250 MG tablet Take 1 tablet (250 mg total) by mouth daily. 06/02/22   Trula Slade, DPM      Allergies    Patient has no known allergies.    Review of Systems   Review of Systems  Constitutional:  Positive for chills.  Respiratory:  Positive for shortness of breath.   Gastrointestinal:  Positive for abdominal pain, diarrhea, nausea and vomiting. Negative for blood in stool.    Physical Exam Updated Vital Signs BP (!) 140/79   Pulse 87   Temp 98.5 F (36.9 C) (Oral)   Resp 18   Ht 6' (1.829 m)   Wt 104.3 kg   SpO2 97%   BMI 31.19 kg/m  Physical Exam Vitals and nursing note reviewed.  Constitutional:      Appearance: He is well-developed. He is not diaphoretic.  HENT:     Head: Normocephalic and atraumatic.  Cardiovascular:     Rate and Rhythm: Regular rhythm. Tachycardia present.     Heart sounds: Normal heart sounds.  Pulmonary:     Effort: Pulmonary effort is normal. Tachypnea present. No accessory muscle usage, prolonged expiration or respiratory distress.     Breath sounds: Normal breath sounds.  Abdominal:     Palpations: Abdomen is soft.     Tenderness: There is generalized abdominal tenderness (RLQ>LLQ>upper abdomen).  Skin:    General: Skin is  warm and dry.  Neurological:     Mental Status: He is alert.     ED Results / Procedures / Treatments   Labs (all labs ordered are listed, but only abnormal results are displayed) Labs Reviewed  COMPREHENSIVE METABOLIC PANEL - Abnormal; Notable for the following components:      Result Value   CO2 20 (*)    Glucose, Bld 111 (*)    Total Protein 8.3 (*)    All other components within normal limits  CBC - Abnormal; Notable for the following components:   RBC 5.98 (*)    Hemoglobin 17.7 (*)    All other components within normal limits  URINALYSIS, ROUTINE W REFLEX MICROSCOPIC - Abnormal; Notable for the following components:   Color, Urine AMBER (*)    Hgb urine  dipstick TRACE (*)    Bilirubin Urine SMALL (*)    Ketones, ur 15 (*)    Protein, ur 100 (*)    All other components within normal limits  URINALYSIS, MICROSCOPIC (REFLEX) - Abnormal; Notable for the following components:   Bacteria, UA RARE (*)    All other components within normal limits  RESP PANEL BY RT-PCR (FLU A&B, COVID) [id number]  GASTROINTESTINAL PANEL BY PCR, STOOL (REPLACES STOOL CULTURE)  C DIFFICILE QUICK SCREEN W PCR REFLEX    LIPASE, BLOOD    EKG None  Radiology CT ABDOMEN PELVIS W CONTRAST  Result Date: 07/14/2022 CLINICAL DATA:  Right lower quadrant abdominal pain. Nausea vomiting and diarrhea for 3 days with chills. EXAM: CT ABDOMEN AND PELVIS WITH CONTRAST TECHNIQUE: Multidetector CT imaging of the abdomen and pelvis was performed using the standard protocol following bolus administration of intravenous contrast. RADIATION DOSE REDUCTION: This exam was performed according to the departmental dose-optimization program which includes automated exposure control, adjustment of the mA and/or kV according to patient size and/or use of iterative reconstruction technique. CONTRAST:  123m OMNIPAQUE IOHEXOL 300 MG/ML  SOLN COMPARISON:  CT examination dated December 01, 2021 FINDINGS: Lower chest: No acute abnormality.  Bibasilar dependent atelectasis. Hepatobiliary: No focal liver abnormality is seen. No gallstones, gallbladder wall thickening, or biliary dilatation. Pancreas: Mild fatty infiltration of the pancreas. No pancreatic ductal dilatation or surrounding inflammatory changes. Spleen: Normal in size without focal abnormality. Small anterior splenule. Adrenals/Urinary Tract: Adrenal glands are unremarkable. Kidneys are normal, without renal calculi, focal lesion, or hydronephrosis. Bladder is unremarkable. Stomach/Bowel: Stomach is within normal limits. Appendix appears normal. There is marked generalized thickening of the colonic wall with mucosal enhancement consistent with  pancolitis, likely secondary to infectious/inflammatory etiology. No evidence of pneumatosis. No extraluminal free air. No abdominal fluid collection or abscess. Vascular/Lymphatic: No significant vascular findings are present. No enlarged abdominal or pelvic lymph nodes. Reproductive: Prostate is unremarkable. Other: No abdominal wall hernia or abnormality. No abdominopelvic ascites. 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 IMPRESSION: 1. Marked generalized thickening of the colonic wall with mucosal enhancement consistent with pancolitis, likely secondary to infectious/inflammatory etiology. Normal appendix. 2. 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 Electronically Signed   By: IKeane PoliceD.O.   On: 07/14/2022 23:15   DG Chest Portable 1 View  Result Date: 07/14/2022 CLINICAL DATA:  Nausea vomiting EXAM: PORTABLE CHEST 1 VIEW COMPARISON:  08/02/2021, CT 08/02/2021, 02/19/2016 FINDINGS: Hazy right infrahilar probable atelectasis. No pleural effusion. Normal cardiac size. No pneumothorax IMPRESSION:  Probable hazy right infrahilar atelectasis. Electronically Signed   By: Donavan Foil M.D.   On: 07/14/2022 22:35    Procedures Procedures    Medications Ordered in ED Medications  ciprofloxacin (CIPRO) tablet 500 mg (has no administration in time range)  metroNIDAZOLE (FLAGYL) tablet 500 mg (has no administration in time range)  HYDROcodone-acetaminophen (NORCO/VICODIN) 5-325 MG per tablet 1 tablet (has no administration in time range)  lactated ringers bolus 1,000 mL (0 mLs Intravenous Stopped 07/14/22 2221)  HYDROmorphone (DILAUDID) injection 1 mg (1 mg Intravenous Given 07/14/22 2142)  metoCLOPramide (REGLAN) injection 10 mg (10 mg Intravenous Given 07/14/22 2142)  iohexol (OMNIPAQUE) 300 MG/ML solution 100 mL (100  mLs Intravenous Contrast Given 07/14/22 2258)    ED Course/ Medical Decision Making/ A&P                           Medical Decision Making Amount and/or Complexity of Data Reviewed Labs: ordered.    Details: Normal WBC.  Labs consistent with dehydration with hemoconcentration, and mildly low bicarbonate without anion gap acidosis.  Urine with ketones. Radiology: ordered and independent interpretation performed.    Details: Colitis.  No SBO.  No perforation.  Risk Prescription drug management.   Patient appears to have colitis.  He has pancolitis on the CT.  He is doing significantly better after being given IV Dilaudid, Reglan, fluids.  He now feels comfortable enough to go home.  GI pathogen panel and C. difficile testing has been sent but unfortunately these are both send out test from this facility.  Given these will not come back tonight but he is otherwise stable for discharge, will put on antibiotics.  He does not have any specific C. difficile risk factors.  Otherwise, will give pain control and refer him to GI, he has seen [facility] in the past.  We discussed return precautions as well as supportive care but he otherwise appears stable for discharge.        Final Clinical Impression(s) / ED Diagnoses Final diagnoses:  Colitis    Rx / DC Orders ED Discharge Orders          Ordered    Ambulatory referral to Gastroenterology        07/14/22 2329    HYDROcodone-acetaminophen (NORCO) 5-325 MG tablet  Every 4 hours PRN        07/14/22 2331    ciprofloxacin (CIPRO) 500 MG tablet  Every 12 hours        07/14/22 2331    metroNIDAZOLE (FLAGYL) 500 MG tablet  2 times daily        07/14/22 2331              Sherwood Gambler, MD 07/14/22 2343

## 2022-07-15 LAB — C DIFFICILE QUICK SCREEN W PCR REFLEX
C Diff antigen: NEGATIVE
C Diff interpretation: NOT DETECTED
C Diff toxin: NEGATIVE

## 2022-07-16 LAB — GASTROINTESTINAL PANEL BY PCR, STOOL (REPLACES STOOL CULTURE)

## 2022-07-16 NOTE — ED Provider Notes (Signed)
Patient stool PCR was positive for Salmonella species.  Patient is currently being treated with Cipro and Flagyl.  The Cipro should cover Salmonella.  No culture results at this point in time.  Patient was given follow-up with GI medicine.  Patient was given referral to his family medicine doctor.   Fredia Sorrow, MD 07/16/22 [phone]

## 2022-07-23 ENCOUNTER — Encounter (HOSPITAL_BASED_OUTPATIENT_CLINIC_OR_DEPARTMENT_OTHER): Payer: Self-pay | Admitting: Emergency Medicine

## 2022-07-23 ENCOUNTER — Emergency Department (HOSPITAL_BASED_OUTPATIENT_CLINIC_OR_DEPARTMENT_OTHER): Payer: Commercial Managed Care - HMO

## 2022-07-23 ENCOUNTER — Emergency Department (HOSPITAL_BASED_OUTPATIENT_CLINIC_OR_DEPARTMENT_OTHER)
Admission: EM | Admit: 2022-07-23 | Discharge: 2022-07-24 | Disposition: A | Discharge status: Other Acute Inpatient Hospital | Payer: Commercial Managed Care - HMO | Attending: Emergency Medicine | Admitting: Emergency Medicine

## 2022-07-23 ENCOUNTER — Other Ambulatory Visit: Payer: Self-pay

## 2022-07-23 DIAGNOSIS — W540XXA Bitten by dog, initial encounter: Secondary | ICD-10-CM | POA: Diagnosis not present

## 2022-07-23 DIAGNOSIS — Z1152 Encounter for screening for COVID-19: Secondary | ICD-10-CM | POA: Diagnosis not present

## 2022-07-23 DIAGNOSIS — S6991XA Unspecified injury of right wrist, hand and finger(s), initial encounter: Secondary | ICD-10-CM | POA: Diagnosis present

## 2022-07-23 DIAGNOSIS — S68112A Complete traumatic metacarpophalangeal amputation of right middle finger, initial encounter: Secondary | ICD-10-CM | POA: Diagnosis not present

## 2022-07-23 DIAGNOSIS — S68119A Complete traumatic metacarpophalangeal amputation of unspecified finger, initial encounter: Secondary | ICD-10-CM

## 2022-07-23 LAB — CBC WITH DIFFERENTIAL/PLATELET
Abs Immature Granulocytes: 0.04 10*3/uL (ref 0.00–0.07)
Basophils Absolute: 0.1 10*3/uL (ref 0.0–0.1)
Basophils Relative: 1 %
Eosinophils Absolute: 0.5 10*3/uL (ref 0.0–0.5)
Eosinophils Relative: 5 %
HCT: 41.4 % (ref 39.0–52.0)
Hemoglobin: 14.1 g/dL (ref 13.0–17.0)
Immature Granulocytes: 0 %
Lymphocytes Relative: 27 %
Lymphs Abs: 2.5 10*3/uL (ref 0.7–4.0)
MCH: 29.1 pg (ref 26.0–34.0)
MCHC: 34.1 g/dL (ref 30.0–36.0)
MCV: 85.5 fL (ref 80.0–100.0)
Monocytes Absolute: 1 10*3/uL (ref 0.1–1.0)
Monocytes Relative: 11 %
Neutro Abs: 5.1 10*3/uL (ref 1.7–7.7)
Neutrophils Relative %: 56 %
Platelets: 257 10*3/uL (ref 150–400)
RBC: 4.84 MIL/uL (ref 4.22–5.81)
RDW: 13.9 % (ref 11.5–15.5)
WBC: 9.1 10*3/uL (ref 4.0–10.5)
nRBC: 0 % (ref 0.0–0.2)

## 2022-07-23 LAB — BASIC METABOLIC PANEL
Anion gap: 4 — ABNORMAL LOW (ref 5–15)
BUN: 14 mg/dL (ref 6–20)
CO2: 25 mmol/L (ref 22–32)
Calcium: 8.2 mg/dL — ABNORMAL LOW (ref 8.9–10.3)
Chloride: 107 mmol/L (ref 98–111)
Creatinine, Ser: 0.93 mg/dL (ref 0.61–1.24)
GFR, Estimated: >60 mL/min (ref >60)
Glucose, Bld: 106 mg/dL — ABNORMAL HIGH (ref 70–99)
Potassium: 3.6 mmol/L (ref 3.5–5.1)
Sodium: 136 mmol/L (ref 135–145)

## 2022-07-23 LAB — RESP PANEL BY RT-PCR (FLU A&B, COVID) ARPGX2
Influenza A by PCR: NEGATIVE
Influenza B by PCR: NEGATIVE
SARS Coronavirus 2 by RT PCR: NEGATIVE

## 2022-07-23 MED ORDER — BUPIVACAINE HCL (PF) 0.5 % IJ SOLN
10.0000 mL | Freq: Once | INTRAMUSCULAR | Status: AC
  Administered 2022-07-23: 10 mL
  Filled 2022-07-23: qty 10

## 2022-07-23 MED ORDER — CEFAZOLIN SODIUM-DEXTROSE 1-4 GM/50ML-% IV SOLN
1.0000 g | Freq: Once | INTRAVENOUS | Status: AC
  Administered 2022-07-23: 1 g via INTRAVENOUS
  Filled 2022-07-23: qty 50

## 2022-07-23 MED ORDER — SODIUM CHLORIDE 0.9 % IV SOLN
3.0000 g | Freq: Once | INTRAVENOUS | Status: AC
  Administered 2022-07-23: 3 g via INTRAVENOUS

## 2022-07-23 MED ORDER — ONDANSETRON HCL 4 MG/2ML IJ SOLN
4.0000 mg | Freq: Once | INTRAMUSCULAR | Status: AC
  Administered 2022-07-23: 4 mg via INTRAVENOUS
  Filled 2022-07-23: qty 2

## 2022-07-23 MED ORDER — HYDROMORPHONE HCL 1 MG/ML IJ SOLN
0.5000 mg | Freq: Once | INTRAMUSCULAR | Status: AC
  Administered 2022-07-23: 0.5 mg via INTRAVENOUS
  Filled 2022-07-23: qty 1

## 2022-07-23 NOTE — Progress Notes (Signed)
Patient discussed with EDP.  Imaging reviewed along with clinical photos.  Based on review, this does not appear to be a salvageable injury in terms of a candidate for replantation.  Recommend thorough bedside irrigation, tetanus up-to-date, and administration of IV antibiotics, both Ancef and Augmentin.  If there is no exposed bone upon cleaning and soft tissues may be reapproximated, recommend discharge on p.o. Augmentin with soon follow-up with Dr. Iran Planas, MD.  If there is exposed bone, injury will likely benefit from acute treatment from a hand specialist, and therefore recommend transfer to a center where a covering hand specialist is available.  Georgeanna Harrison M.D. Orthopaedic Surgery [facility]

## 2022-07-23 NOTE — ED Provider Notes (Signed)
  [facility] Provider Note   CSN: [id number] Arrival date & time: 07/23/22  2028     History {Add pertinent medical, surgical, social history, OB history to HPI:1} Chief Complaint  Patient presents with  . fingertip amputation     Eric Peck is a 46 y.o. male.  HPI     Home Medications Prior to Admission medications   Medication Sig Start Date End Date Taking? Authorizing Provider  acetaminophen (TYLENOL) 500 MG tablet Take 1 tablet (500 mg total) by mouth every 6 (six) hours as needed. 08/02/21   Varney Biles, MD  ciprofloxacin (CIPRO) 500 MG tablet Take 1 tablet (500 mg total) by mouth every 12 (twelve) hours. 07/14/22   Sherwood Gambler, MD  diclofenac Sodium (VOLTAREN) 1 % GEL SMARTSIG:Gram(s) Topical 4 Times Daily 04/26/22   [provider]  HYDROcodone-acetaminophen (NORCO) 5-325 MG tablet Take 1 tablet by mouth every 4 (four) hours as needed. 07/14/22   Sherwood Gambler, MD  losartan (COZAAR) 50 MG tablet Take 1 tablet (50 mg total) by mouth at bedtime. 05/18/22 08/16/22  Custovic, Collene Mares, DO  metroNIDAZOLE (FLAGYL) 500 MG tablet Take 1 tablet (500 mg total) by mouth 2 (two) times daily. 07/14/22   Sherwood Gambler, MD  ondansetron (ZOFRAN-ODT) 4 MG disintegrating tablet Take 1 tablet (4 mg total) by mouth every 8 (eight) hours as needed for nausea or vomiting. 12/01/21   Khatri, Hina, PA-C  rosuvastatin (CRESTOR) 40 MG tablet Take 1 tablet (40 mg total) by mouth at bedtime. 05/18/22 08/16/22  Custovic, Collene Mares, DO  terbinafine (LAMISIL) 250 MG tablet Take 1 tablet (250 mg total) by mouth daily. 06/02/22   Trula Slade, DPM      Allergies    Patient has no known allergies.    Review of Systems   Review of Systems  Physical Exam Updated Vital Signs There were no vitals taken for this visit. Physical Exam         ED Results / Procedures / Treatments   Labs (all labs ordered are listed, but only abnormal results are  displayed) Labs Reviewed - No data to display  EKG None  Radiology No results found.  Procedures Procedures  {Document cardiac monitor, telemetry assessment procedure when appropriate:1}  Medications Ordered in ED Medications - No data to display  ED Course/ Medical Decision Making/ A&P                           Medical Decision Making  ***  {Document critical care time when appropriate:1} {Document review of labs and clinical decision tools ie heart score, Chads2Vasc2 etc:1}  {Document your independent review of radiology images, and any outside records:1} {Document your discussion with family members, caretakers, and with consultants:1} {Document social determinants of health affecting pt's care:1} {Document your decision making why or why not admission, treatments were needed:1} Final Clinical Impression(s) / ED Diagnoses Final diagnoses:  None    Rx / DC Orders ED Discharge Orders     None

## 2022-07-23 NOTE — ED Triage Notes (Signed)
Pt was playing with his dog and the dog bit the tip of his R middle finger. Pt arrives to the ED with the tip of his finger in a plastic baggie. Fingertip placed on ice and provider to bedside. Incident occurred around 2000. Finger bleeding but not heavily.

## 2022-07-23 NOTE — ED Notes (Signed)
Provider at beside medication and suture cart in room

## 2022-07-24 MED ORDER — HYDROMORPHONE HCL 1 MG/ML IJ SOLN
0.5000 mg | Freq: Once | INTRAMUSCULAR | Status: AC
  Administered 2022-07-24: 0.5 mg via INTRAVENOUS
  Filled 2022-07-24: qty 1

## 2022-07-24 MED ORDER — AMOXICILLIN-POT CLAVULANATE 875-125 MG PO TABS: 1.0000 | ORAL_TABLET | Freq: Two times a day (BID) | ORAL | 0 refills | Status: AC

## 2022-07-24 NOTE — ED Notes (Signed)
Report called to Agricultural consultant at [facility] ER for ED-ED transfer.  Pt stable to transfer by POV.  Paperwork sent with pt.
